# Patient Record
Sex: Female | Born: 1945
Health system: Southern US, Community
[De-identification: ages and names within clinical notes are randomized; demographics above are authoritative.]

## PROBLEM LIST (undated history)

## (undated) DIAGNOSIS — I701 Atherosclerosis of renal artery: Secondary | ICD-10-CM

## (undated) DIAGNOSIS — I6529 Occlusion and stenosis of unspecified carotid artery: Secondary | ICD-10-CM

## (undated) DIAGNOSIS — I10 Essential (primary) hypertension: Secondary | ICD-10-CM

## (undated) DIAGNOSIS — K5790 Diverticulosis of intestine, part unspecified, without perforation or abscess without bleeding: Secondary | ICD-10-CM

## (undated) DIAGNOSIS — E785 Hyperlipidemia, unspecified: Secondary | ICD-10-CM

## (undated) DIAGNOSIS — R55 Syncope and collapse: Secondary | ICD-10-CM

## (undated) DIAGNOSIS — N200 Calculus of kidney: Secondary | ICD-10-CM

## (undated) DIAGNOSIS — K635 Polyp of colon: Secondary | ICD-10-CM

## (undated) DIAGNOSIS — K219 Gastro-esophageal reflux disease without esophagitis: Secondary | ICD-10-CM

## (undated) DIAGNOSIS — M8580 Other specified disorders of bone density and structure, unspecified site: Secondary | ICD-10-CM

## (undated) DIAGNOSIS — J101 Influenza due to other identified influenza virus with other respiratory manifestations: Secondary | ICD-10-CM

## (undated) DIAGNOSIS — T7840XA Allergy, unspecified, initial encounter: Secondary | ICD-10-CM

## (undated) DIAGNOSIS — J189 Pneumonia, unspecified organism: Secondary | ICD-10-CM

## (undated) HISTORY — DX: Diverticulosis of intestine, part unspecified, without perforation or abscess without bleeding: K57.90

## (undated) HISTORY — PX: SMALL INTESTINE SURGERY: SHX150

## (undated) HISTORY — DX: Other specified disorders of bone density and structure, unspecified site: M85.80

## (undated) HISTORY — DX: Polyp of colon: K63.5

## (undated) HISTORY — DX: Occlusion and stenosis of unspecified carotid artery: I65.29

## (undated) HISTORY — DX: Gastro-esophageal reflux disease without esophagitis: K21.9

## (undated) HISTORY — DX: Pneumonia, unspecified organism: J18.9

## (undated) HISTORY — DX: Atherosclerosis of renal artery: I70.1

## (undated) HISTORY — DX: Allergy, unspecified, initial encounter: T78.40XA

## (undated) HISTORY — DX: Calculus of kidney: N20.0

## (undated) HISTORY — DX: Hyperlipidemia, unspecified: E78.5

---

## 1980-12-22 HISTORY — PX: ABDOMINAL HYSTERECTOMY: SHX81

## 2005-04-24 ENCOUNTER — Ambulatory Visit (HOSPITAL_COMMUNITY): Admission: RE | Admit: 2005-04-24 | Discharge: 2005-04-24 | Payer: Self-pay | Admitting: Family Medicine

## 2006-07-13 ENCOUNTER — Emergency Department (HOSPITAL_COMMUNITY): Admission: EM | Admit: 2006-07-13 | Discharge: 2006-07-13 | Payer: Self-pay | Admitting: Emergency Medicine

## 2006-12-22 LAB — HM COLONOSCOPY

## 2007-02-15 ENCOUNTER — Ambulatory Visit (HOSPITAL_COMMUNITY): Admission: RE | Admit: 2007-02-15 | Discharge: 2007-02-15 | Payer: Self-pay | Admitting: Internal Medicine

## 2007-02-15 ENCOUNTER — Ambulatory Visit: Payer: Self-pay | Admitting: Internal Medicine

## 2007-02-15 ENCOUNTER — Encounter (INDEPENDENT_AMBULATORY_CARE_PROVIDER_SITE_OTHER): Payer: Self-pay | Admitting: *Deleted

## 2007-04-15 ENCOUNTER — Ambulatory Visit (HOSPITAL_COMMUNITY): Admission: RE | Admit: 2007-04-15 | Discharge: 2007-04-15 | Payer: Self-pay | Admitting: Family Medicine

## 2008-03-31 ENCOUNTER — Ambulatory Visit (HOSPITAL_COMMUNITY): Admission: RE | Admit: 2008-03-31 | Discharge: 2008-03-31 | Payer: Self-pay | Admitting: Family Medicine

## 2008-05-12 ENCOUNTER — Ambulatory Visit (HOSPITAL_COMMUNITY): Admission: RE | Admit: 2008-05-12 | Discharge: 2008-05-12 | Payer: Self-pay | Admitting: Family Medicine

## 2009-06-05 ENCOUNTER — Ambulatory Visit (HOSPITAL_COMMUNITY): Admission: RE | Admit: 2009-06-05 | Discharge: 2009-06-05 | Payer: Self-pay | Admitting: Family Medicine

## 2010-09-26 ENCOUNTER — Ambulatory Visit (HOSPITAL_COMMUNITY): Admission: RE | Admit: 2010-09-26 | Discharge: 2010-09-26 | Payer: Self-pay | Admitting: Family Medicine

## 2011-05-02 ENCOUNTER — Other Ambulatory Visit: Payer: Self-pay | Admitting: Family Medicine

## 2011-05-02 DIAGNOSIS — M8580 Other specified disorders of bone density and structure, unspecified site: Secondary | ICD-10-CM

## 2011-05-03 LAB — HM DEXA SCAN

## 2011-05-07 ENCOUNTER — Ambulatory Visit (HOSPITAL_COMMUNITY)
Admission: RE | Admit: 2011-05-07 | Discharge: 2011-05-07 | Disposition: A | Discharge status: Home / Self Care | Payer: Medicare Other | Source: Ambulatory Visit | Attending: Family Medicine | Admitting: Family Medicine

## 2011-05-07 ENCOUNTER — Encounter (HOSPITAL_COMMUNITY): Payer: Self-pay

## 2011-05-07 ENCOUNTER — Other Ambulatory Visit: Payer: Self-pay

## 2011-05-07 DIAGNOSIS — M818 Other osteoporosis without current pathological fracture: Secondary | ICD-10-CM | POA: Insufficient documentation

## 2011-05-07 DIAGNOSIS — M8580 Other specified disorders of bone density and structure, unspecified site: Secondary | ICD-10-CM

## 2011-05-07 DIAGNOSIS — Z780 Asymptomatic menopausal state: Secondary | ICD-10-CM | POA: Insufficient documentation

## 2011-05-07 HISTORY — DX: Essential (primary) hypertension: I10

## 2011-05-09 NOTE — Op Note (Signed)
NAMEJAYLNN, ULLERY                  ACCOUNT NO.:  [id number]   MEDICAL RECORD NO.:  [id number]          PATIENT TYPE:  AMB   LOCATION:  DAY                           FACILITY:  [facility]   PHYSICIAN:  R. Roetta Sessions, M.D. DATE OF BIRTH:  November 10, 1946   DATE OF PROCEDURE:  02/15/2007  DATE OF DISCHARGE:                               OPERATIVE REPORT   PROCEDURE PERFORMED:  Colonoscopy and hot snare polypectomy.   INDICATIONS FOR PROCEDURE:  A 65 year old lady with intermittent paper  hematochezia, positive family history of colon cancer in two first-  degree relatives.  Her last colonoscopy is 1989 without significant  findings reportedly.  She is here for diagnostic colonoscopy.  Have  discussed with the patient at length.  Potential risks, benefits and  alternatives have been reviewed.  She is referred at the courtesy of  Nicoletta Ba.   PROCEDURE NOTE:  O2 saturation, blood pressure, pulse oximetry were  monitored throughout the entire procedure.  Conscious sedation Demerol  100 grams IV and Versed 5 mg IV in divided doses.  Pentax video chip  system.   FINDINGS:  Digital rectal exam revealed no abnormalities.  The prep was  adequate.  The colonic mucosa was surveyed from the rectosigmoid  junction through the left transverse right colon to the appendiceal  orifice, ileocecal valve and cecum where these structures were well seen  and photographed for the record.  From this level, the scope was slowly  withdrawn.  All previously mentioned mucosal surfaces were again seen.  The patient had scattered left-sided diverticulum.  The remaining  colonic mucosa appeared normal.  The scope was then pulled down to the  rectum where a thorough examination of the rectal mucosa including  retroflex view of the anal verge was undertaken.  The patient friable  anal canal hemorrhoids and an 8-mm angry appearing polyp on a stalk at  15 cm.  Please see photos.  The polyp was engaged with a snare loop  and  was resected via snare cautery.  It  was recovered.  The patient  tolerated the procedure throughout endoscopy.   IMPRESSION:  1. Friable anal canal hemorrhoids, likely source the patient's paper      hematochezia.  2. Pedunculated polyp at 15 cm (rectum) removed with hot snare.      Remainder of rectal mucosa appeared normal.  3. Left-sided diverticula.  Colonic mucosa appeared normal.   RECOMMENDATION:  1. No arthritis medications or aspirin for the next 10 days.  2. Follow-up on path.  3. Hemorrhoid and diverticulosis literature provided to Ms. Diffley,      daily fiber supplement.  4. Anusol HC suppositories one per rectum at bedtime for 10 days.  5. Further recommendations to follow.      Jonathon Bellows, M.D.  Electronically Signed     RMR/MEDQ  D:  02/15/2007  T:  02/15/2007  Job:  [id number]   cc:   Jeoffrey Massed, MD  Fax: [phone]

## 2011-10-06 ENCOUNTER — Other Ambulatory Visit: Payer: Self-pay | Admitting: Family Medicine

## 2011-10-06 DIAGNOSIS — Z139 Encounter for screening, unspecified: Secondary | ICD-10-CM

## 2011-10-07 ENCOUNTER — Ambulatory Visit (HOSPITAL_COMMUNITY)
Admission: RE | Admit: 2011-10-07 | Discharge: 2011-10-07 | Disposition: A | Discharge status: Home / Self Care | Payer: Medicare Other | Source: Ambulatory Visit | Attending: Family Medicine | Admitting: Family Medicine

## 2011-10-07 DIAGNOSIS — Z139 Encounter for screening, unspecified: Secondary | ICD-10-CM

## 2011-10-07 DIAGNOSIS — Z1231 Encounter for screening mammogram for malignant neoplasm of breast: Secondary | ICD-10-CM | POA: Insufficient documentation

## 2012-05-07 DIAGNOSIS — Z0000 Encounter for general adult medical examination without abnormal findings: Secondary | ICD-10-CM | POA: Diagnosis not present

## 2012-05-18 ENCOUNTER — Other Ambulatory Visit: Payer: Self-pay | Admitting: Family Medicine

## 2012-05-18 DIAGNOSIS — R0989 Other specified symptoms and signs involving the circulatory and respiratory systems: Secondary | ICD-10-CM

## 2012-05-20 ENCOUNTER — Ambulatory Visit (HOSPITAL_COMMUNITY)
Admission: RE | Admit: 2012-05-20 | Discharge: 2012-05-20 | Disposition: A | Discharge status: Home / Self Care | Payer: Medicare Other | Source: Ambulatory Visit | Attending: Family Medicine | Admitting: Family Medicine

## 2012-05-20 DIAGNOSIS — R0989 Other specified symptoms and signs involving the circulatory and respiratory systems: Secondary | ICD-10-CM

## 2012-05-20 DIAGNOSIS — I658 Occlusion and stenosis of other precerebral arteries: Secondary | ICD-10-CM | POA: Diagnosis not present

## 2012-05-26 DIAGNOSIS — M810 Age-related osteoporosis without current pathological fracture: Secondary | ICD-10-CM | POA: Diagnosis not present

## 2012-06-07 ENCOUNTER — Encounter: Payer: Self-pay | Admitting: Vascular Surgery

## 2012-06-21 ENCOUNTER — Encounter: Payer: Self-pay | Admitting: Vascular Surgery

## 2012-06-22 ENCOUNTER — Ambulatory Visit (INDEPENDENT_AMBULATORY_CARE_PROVIDER_SITE_OTHER): Payer: Medicare Other | Admitting: Vascular Surgery

## 2012-06-22 ENCOUNTER — Encounter: Payer: Self-pay | Admitting: Vascular Surgery

## 2012-06-22 VITALS — BP 165/55 | HR 76 | Temp 97.8°F | Ht 63.0 in | Wt 149.0 lb

## 2012-06-22 DIAGNOSIS — I6529 Occlusion and stenosis of unspecified carotid artery: Secondary | ICD-10-CM | POA: Diagnosis not present

## 2012-06-22 NOTE — Progress Notes (Signed)
[facility]   Vascular Consult Note    Patient name: Stacey Moody MRN: [id number] DOB: 07/20/1946 Sex: female   Referred by: Tanya Nones  Reason for referral: Carotid stenosis  HISTORY OF PRESENT ILLNESS:  The patient presents today for evaluation of asymptomatic cerebrovascular occlusive disease. She had been found to have a carotid bruit in the past and in 2009 underwent duplex showing moderate 50-69% stenosis bilaterally at an outlying center. He recently had repeat study in May of 2013 this shows continued 50-69% stenosis with extensive calcification bilaterally. She denies any prior neurologic deficits. She specifically denies any amaurosis fugax transient ischemic attack or stroke.  Past Medical History  Diagnosis Date  . Hypertension   . Carotid artery occlusion   . Osteopenia   . Hyperlipidemia   . GERD (gastroesophageal reflux disease)     Past Surgical History  Procedure Date  . Abdominal hysterectomy 1982    Partial hysterectomy  . Small intestine surgery     History   Social History  . Marital Status: Widowed    Spouse Name: N/A    Number of Children: N/A  . Years of Education: N/A   Occupational History  . Not on file.   Social History Main Topics  . Smoking status: Former Smoker    Quit date: 12/22/1996  . Smokeless tobacco: Never Used  . Alcohol Use: No  . Drug Use: No  . Sexually Active: Not on file   Other Topics Concern  . Not on file   Social History Narrative  . No narrative on file    Family History  Problem Relation Age of Onset  . Cancer Mother     pancratic  . Heart attack Mother   . Cancer Father     stomach  . Diabetes Father   . Hyperlipidemia Father   . Cancer Sister     lung  . Cancer Brother     thyroid, prostate  . Cancer Sister     colon  . Cancer Sister     colon    Allergies  Allergen Reactions  . Tetanus Toxoids     Prior to Admission medications   Medication Sig  Start Date End Date Taking? Authorizing Provider  amLODipine (NORVASC) 10 MG tablet Take 10 mg by mouth daily.   Yes Historical Provider, MD  aspirin 325 MG tablet Take 325 mg by mouth daily.   Yes Historical Provider, MD  cholecalciferol (VITAMIN D) 1000 UNITS tablet Take 1,000 Units by mouth daily.   Yes Historical Provider, MD  Denosumab (PROLIA [place]) Inject into the skin. Twice a year last received on 06/05/2012   Yes Historical Provider, MD  hydrochlorothiazide (HYDRODIURIL) 25 MG tablet Take 25 mg by mouth daily.   Yes Historical Provider, MD  rosuvastatin (CRESTOR) 10 MG tablet Take 20 mg by mouth daily.    Yes Historical Provider, MD     REVIEW OF SYSTEMS: Cardiovascular: No chest pain, chest pressure, palpitations, orthopnea, or dyspnea on exertion. No claudication or rest pain,  No history of DVT or phlebitis. Pulmonary: No productive cough, asthma or wheezing. Neurologic: No weakness, paresthesias, aphasia, or amaurosis. No dizziness. Hematologic: No bleeding problems or clotting disorders. Musculoskeletal: No joint pain or joint swelling. Gastrointestinal: No blood in stool or hematemesis Genitourinary: No dysuria or hematuria. Psychiatric:: No history of major depression. Integumentary: No rashes or ulcers. Constitutional: No fever or chills.  PHYSICAL EXAMINATION:  Filed Vitals:  06/22/12 0843  BP: 165/55  Pulse: 76  Temp:     General: The patient appears their stated age. Pulmonary: There is a good air exchange bilaterally without wheezing or rales. Abdomen: Soft and non-tender with normal pitch bowel sounds. Musculoskeletal: There are no major deformities.  There is no significant extremity pain. Neurologic: No focal weakness or paresthesias are detected, Skin: There are no ulcer or rashes noted. Psychiatric: The patient has normal affect. Cardiovascular: There is a regular rate and rhythm without significant murmur appreciated. Carotid artery: 2+ pulses. I do  not appreciate a bruit today. Peripheral pulses: 2+ radial 2+ femoral and 2+ posterior tibial pulses bilaterally   Outside Studies/Documentation Historical records were reviewed.  They showed carotid duplex in 2009 in May of 2013 from outlying facility were reviewed showing 50-69% stenoses bilaterally  Medication Changes: None  Assessment:  Moderate to severe bilateral carotid stenosis   Plan: I had a long discussion with the patient and her daughter present. I explained symptoms of carotid disease with her. Ex-preemie she is below the threshold where recommend any treatment for her level of asymptomatic stenosis. I explained we would recommend yearly duplex of her carotids to rule out asymptomatic progression. Plan to see her again in one year and she'll notify should she develop any difficulty in the future  Ladarrell Cornwall 7/2/20133:19 PM  .

## 2012-06-29 ENCOUNTER — Encounter: Payer: Medicare Other | Admitting: Vascular Surgery

## 2012-10-11 ENCOUNTER — Other Ambulatory Visit: Payer: Self-pay | Admitting: Family Medicine

## 2012-10-11 DIAGNOSIS — Z139 Encounter for screening, unspecified: Secondary | ICD-10-CM

## 2012-10-15 ENCOUNTER — Ambulatory Visit (HOSPITAL_COMMUNITY)
Admission: RE | Admit: 2012-10-15 | Discharge: 2012-10-15 | Disposition: A | Discharge status: Home / Self Care | Payer: Medicare Other | Source: Ambulatory Visit | Attending: Family Medicine | Admitting: Family Medicine

## 2012-10-15 DIAGNOSIS — Z1231 Encounter for screening mammogram for malignant neoplasm of breast: Secondary | ICD-10-CM | POA: Insufficient documentation

## 2012-10-15 DIAGNOSIS — Z139 Encounter for screening, unspecified: Secondary | ICD-10-CM

## 2012-11-16 DIAGNOSIS — M76899 Other specified enthesopathies of unspecified lower limb, excluding foot: Secondary | ICD-10-CM | POA: Diagnosis not present

## 2012-11-16 DIAGNOSIS — M5430 Sciatica, unspecified side: Secondary | ICD-10-CM | POA: Diagnosis not present

## 2013-04-25 ENCOUNTER — Telehealth: Payer: Self-pay | Admitting: Family Medicine

## 2013-04-25 MED ORDER — HYDROCHLOROTHIAZIDE 25 MG PO TABS: 25.0000 mg | ORAL_TABLET | Freq: Every day | ORAL | Status: DC

## 2013-04-25 MED ORDER — ROSUVASTATIN CALCIUM 10 MG PO TABS: 20.0000 mg | ORAL_TABLET | Freq: Every day | ORAL | Status: DC

## 2013-04-25 NOTE — Telephone Encounter (Signed)
Rx Refilled  

## 2013-05-09 ENCOUNTER — Other Ambulatory Visit: Payer: Self-pay | Admitting: Family Medicine

## 2013-05-09 ENCOUNTER — Ambulatory Visit (INDEPENDENT_AMBULATORY_CARE_PROVIDER_SITE_OTHER): Payer: Medicare Other | Admitting: Family Medicine

## 2013-05-09 ENCOUNTER — Encounter: Payer: Self-pay | Admitting: Family Medicine

## 2013-05-09 VITALS — BP 130/60 | HR 76 | Temp 98.1°F | Resp 16 | Ht 64.0 in | Wt 152.0 lb

## 2013-05-09 DIAGNOSIS — Z0000 Encounter for general adult medical examination without abnormal findings: Secondary | ICD-10-CM | POA: Diagnosis not present

## 2013-05-09 DIAGNOSIS — E785 Hyperlipidemia, unspecified: Secondary | ICD-10-CM | POA: Diagnosis not present

## 2013-05-09 DIAGNOSIS — I10 Essential (primary) hypertension: Secondary | ICD-10-CM | POA: Diagnosis not present

## 2013-05-09 DIAGNOSIS — M810 Age-related osteoporosis without current pathological fracture: Secondary | ICD-10-CM

## 2013-05-09 DIAGNOSIS — I6529 Occlusion and stenosis of unspecified carotid artery: Secondary | ICD-10-CM

## 2013-05-09 DIAGNOSIS — R7309 Other abnormal glucose: Secondary | ICD-10-CM | POA: Diagnosis not present

## 2013-05-09 DIAGNOSIS — K219 Gastro-esophageal reflux disease without esophagitis: Secondary | ICD-10-CM | POA: Insufficient documentation

## 2013-05-09 LAB — CBC WITH DIFFERENTIAL/PLATELET
Basophils Relative: 1 % (ref 0–1)
Eosinophils Absolute: 0 10*3/uL (ref 0.0–0.7)
Eosinophils Relative: 1 % (ref 0–5)
Lymphs Abs: 1.2 10*3/uL (ref 0.7–4.0)
MCH: 32.2 pg (ref 26.0–34.0)
MCHC: 35.5 g/dL (ref 30.0–36.0)
MCV: 90.6 fL (ref 78.0–100.0)
Monocytes Relative: 12 % (ref 3–12)
Platelets: 253 10*3/uL (ref 150–400)
RBC: 4.16 MIL/uL (ref 3.87–5.11)

## 2013-05-09 LAB — BASIC METABOLIC PANEL
BUN: 13 mg/dL (ref 6–23)
CO2: 27 mEq/L (ref 19–32)
Chloride: 106 mEq/L (ref 96–112)
Potassium: 3.9 mEq/L (ref 3.5–5.3)

## 2013-05-09 LAB — HEPATIC FUNCTION PANEL
AST: 27 U/L (ref 0–37)
Albumin: 4.6 g/dL (ref 3.5–5.2)
Alkaline Phosphatase: 60 U/L (ref 39–117)
Total Protein: 6.9 g/dL (ref 6.0–8.3)

## 2013-05-09 LAB — TSH: TSH: 3.158 u[IU]/mL (ref 0.350–4.500)

## 2013-05-09 MED ORDER — LOSARTAN POTASSIUM 50 MG PO TABS: 50.0000 mg | ORAL_TABLET | Freq: Every day | ORAL | Status: DC

## 2013-05-09 MED ORDER — DOXYCYCLINE HYCLATE 100 MG PO TABS: 100.0000 mg | ORAL_TABLET | Freq: Two times a day (BID) | ORAL | Status: DC

## 2013-05-09 NOTE — Progress Notes (Signed)
Subjective:    Patient ID: Stacey Moody, female    DOB: March 04, 1946, 67 y.o.   MRN: [id number]  HPI  Patient is here today for physical exam. Just has a red rash on the medial aspect of her left ankle. Is a red ring approximately 5 cm in diameter. There is central clearing. There are petechiae within the area of erythema. There is poor. She denies any trauma. It has been there for 3 days. She denies any fevers chills aches or pains. She does have 2 dogs and works outside a lot. She denies any recent tick bite. She also complains of swelling in her ankles. This is due to the amlodipine. She is interested in other options for blood pressure control. Past Medical History  Diagnosis Date  . Carotid artery occlusion   . Osteopenia   . Hypertension   . Hyperlipidemia   . GERD (gastroesophageal reflux disease)    Current Outpatient Prescriptions on File Prior to Visit  Medication Sig Dispense Refill  . aspirin 325 MG tablet Take 325 mg by mouth daily.      . cholecalciferol (VITAMIN D) 1000 UNITS tablet Take 1,000 Units by mouth daily.      . Denosumab (PROLIA [place]) Inject into the skin. Twice a year last received on 06/05/2012      . hydrochlorothiazide (HYDRODIURIL) 25 MG tablet Take 1 tablet (25 mg total) by mouth daily.  30 tablet  5  . rosuvastatin (CRESTOR) 10 MG tablet Take 2 tablets (20 mg total) by mouth daily.  30 tablet  5   No current facility-administered medications on file prior to visit.   Allergies  Allergen Reactions  . Tetanus Toxoids    History   Social History  . Marital Status: Widowed    Spouse Name: N/A    Number of Children: N/A  . Years of Education: N/A   Occupational History  . Not on file.   Social History Main Topics  . Smoking status: Former Smoker    Quit date: 12/22/1996  . Smokeless tobacco: Never Used  . Alcohol Use: No  . Drug Use: No  . Sexually Active: Not on file   Other Topics Concern  . Not on file   Social History Narrative  . No  narrative on file   Stacey Moody had no medications administered during this visit.   Review of Systems  All other systems reviewed and are negative.   colonoscopy was in 2007. Her mammogram is not due until the fall of 20 pain. She did not get Pap smears due to her history of a hysterectomy. Her Pneumovax, tetanus shot, and Zostavax are up-to-date. She does have a history of osteopenia on a DEXA scan. She cannot tolerate bisphosphonates. She cannot tolerate prolia.     Objective:   Physical Exam  Vitals reviewed. Constitutional: She is oriented to person, place, and time. She appears well-developed and well-nourished.  HENT:  Head: Normocephalic and atraumatic.  Right Ear: External ear normal.  Left Ear: External ear normal.  Nose: Nose normal.  Mouth/Throat: Oropharynx is clear and moist. No oropharyngeal exudate.  Eyes: Conjunctivae and EOM are normal. Pupils are equal, round, and reactive to light. Right eye exhibits no discharge. Left eye exhibits no discharge. No scleral icterus.  Neck: Normal range of motion. Neck supple. No JVD present. No tracheal deviation present. No thyromegaly present.  Cardiovascular: Normal rate, regular rhythm and normal heart sounds.  Exam reveals no gallop and no friction rub.  No murmur heard. Pulmonary/Chest: Effort normal and breath sounds normal. No respiratory distress. She has no wheezes. She has no rales. She exhibits no tenderness.  Abdominal: Soft. Bowel sounds are normal. She exhibits no distension. There is no tenderness. There is no rebound and no guarding.  Musculoskeletal: Normal range of motion. She exhibits no edema and no tenderness.  Lymphadenopathy:    She has no cervical adenopathy.  Neurological: She is alert and oriented to person, place, and time. She has normal reflexes. She displays normal reflexes. No cranial nerve deficit. She exhibits normal muscle tone. Coordination normal.  Skin: Skin is warm. Rash noted. There is erythema.  No pallor.  Psychiatric: She has a normal mood and affect. Her behavior is normal. Judgment and thought content normal.   5 cm red ring on her medial left ankle Breast exam is normal       Assessment & Plan:  1. Routine general medical examination at a health care facility Cancer screening and immunizations are up to date - Basic Metabolic Panel - CBC with Differential - Hepatic Function Panel - Lipid Panel - TSH - Vitamin D, 25-hydroxy  2. HTN (hypertension) Blood pressure is well controlled. Discontinue amlodipine due to swelling. Replace with losartan 50 mg by mouth - Basic Metabolic Panel - Hepatic Function Panel - Lipid Panel  3. Osteoporosis, unspecified We discussed Evista versus re\re class. The patient elects to continue calcium and vitamin D and recheck a DEXA scan in 2 years. She is afraid of this increased stroke risk on Evista - TSH - Vitamin D, 25-hydroxy  4. Hypertension  5. Hyperlipidemia Check fasting lipid panel. Goal LDL is less than 70  6. Occlusion and stenosis of carotid artery without mention of cerebral infarction, unspecified laterality Continue aspirin and statin therapy. Goal LDL is less than 70. Patient is followed by Dr. Arbie Cookey.  #7 possible Lyme disease. Start doxycycline 100 mg by mouth twice a day for 21 days.  Recheck in 2 weeks sooner if worse.

## 2013-05-10 LAB — HEMOGLOBIN A1C
Hgb A1c MFr Bld: 5.8 % — ABNORMAL HIGH (ref <5.7)
Mean Plasma Glucose: 120 mg/dL — ABNORMAL HIGH (ref <117)

## 2013-05-10 LAB — VITAMIN D 25 HYDROXY (VIT D DEFICIENCY, FRACTURES): Vit D, 25-Hydroxy: 45 ng/mL (ref 30–89)

## 2013-06-22 ENCOUNTER — Other Ambulatory Visit: Payer: Self-pay | Admitting: *Deleted

## 2013-06-28 ENCOUNTER — Other Ambulatory Visit (INDEPENDENT_AMBULATORY_CARE_PROVIDER_SITE_OTHER): Payer: Medicare Other | Admitting: *Deleted

## 2013-06-28 ENCOUNTER — Ambulatory Visit: Payer: Medicare Other | Admitting: Neurosurgery

## 2013-06-28 DIAGNOSIS — I6529 Occlusion and stenosis of unspecified carotid artery: Secondary | ICD-10-CM

## 2013-06-30 ENCOUNTER — Other Ambulatory Visit: Payer: Self-pay | Admitting: *Deleted

## 2013-07-05 ENCOUNTER — Encounter: Payer: Self-pay | Admitting: Vascular Surgery

## 2013-10-27 ENCOUNTER — Other Ambulatory Visit: Payer: Self-pay | Admitting: Family Medicine

## 2013-10-27 NOTE — Telephone Encounter (Signed)
Refilled for 30 days only, pt is due for office visit

## 2013-12-05 ENCOUNTER — Other Ambulatory Visit: Payer: Medicare Other

## 2013-12-05 DIAGNOSIS — E785 Hyperlipidemia, unspecified: Secondary | ICD-10-CM

## 2013-12-05 DIAGNOSIS — M810 Age-related osteoporosis without current pathological fracture: Secondary | ICD-10-CM

## 2013-12-05 DIAGNOSIS — R7309 Other abnormal glucose: Secondary | ICD-10-CM

## 2013-12-05 DIAGNOSIS — Z79899 Other long term (current) drug therapy: Secondary | ICD-10-CM | POA: Diagnosis not present

## 2013-12-05 DIAGNOSIS — I10 Essential (primary) hypertension: Secondary | ICD-10-CM | POA: Diagnosis not present

## 2013-12-05 LAB — CBC WITH DIFFERENTIAL/PLATELET
Basophils Relative: 1 % (ref 0–1)
Eosinophils Absolute: 0.1 10*3/uL (ref 0.0–0.7)
Eosinophils Relative: 2 % (ref 0–5)
MCH: 32.9 pg (ref 26.0–34.0)
MCHC: 34.6 g/dL (ref 30.0–36.0)
MCV: 95 fL (ref 78.0–100.0)
Neutrophils Relative %: 56 % (ref 43–77)
Platelets: 253 10*3/uL (ref 150–400)

## 2013-12-05 LAB — COMPLETE METABOLIC PANEL WITH GFR
ALT: 14 U/L (ref 0–35)
AST: 18 U/L (ref 0–37)
Albumin: 4.6 g/dL (ref 3.5–5.2)
CO2: 28 mEq/L (ref 19–32)
Calcium: 9.7 mg/dL (ref 8.4–10.5)
Chloride: 103 mEq/L (ref 96–112)
GFR, Est African American: >89 mL/min
Potassium: 4.5 mEq/L (ref 3.5–5.3)
Sodium: 141 mEq/L (ref 135–145)
Total Protein: 6.6 g/dL (ref 6.0–8.3)

## 2013-12-05 LAB — LIPID PANEL
HDL: 63 mg/dL (ref >39)
LDL Cholesterol: 69 mg/dL (ref 0–99)
Total CHOL/HDL Ratio: 2.3 Ratio

## 2013-12-05 LAB — TSH: TSH: 2.617 u[IU]/mL (ref 0.350–4.500)

## 2013-12-05 LAB — HEMOGLOBIN A1C: Mean Plasma Glucose: 126 mg/dL — ABNORMAL HIGH (ref <117)

## 2013-12-06 LAB — VITAMIN D 25 HYDROXY (VIT D DEFICIENCY, FRACTURES): Vit D, 25-Hydroxy: 55 ng/mL (ref 30–89)

## 2013-12-31 ENCOUNTER — Emergency Department (HOSPITAL_COMMUNITY): Payer: Medicare Other

## 2013-12-31 ENCOUNTER — Inpatient Hospital Stay (HOSPITAL_COMMUNITY)
Admission: EM | Admit: 2013-12-31 | Discharge: 2014-01-03 | DRG: 641 | Disposition: A | Discharge status: Home / Self Care | Payer: Medicare Other | Attending: Internal Medicine | Admitting: Internal Medicine

## 2013-12-31 ENCOUNTER — Encounter (HOSPITAL_COMMUNITY): Payer: Self-pay | Admitting: Emergency Medicine

## 2013-12-31 DIAGNOSIS — H6691 Otitis media, unspecified, right ear: Secondary | ICD-10-CM | POA: Diagnosis present

## 2013-12-31 DIAGNOSIS — D72819 Decreased white blood cell count, unspecified: Secondary | ICD-10-CM | POA: Diagnosis not present

## 2013-12-31 DIAGNOSIS — D61818 Other pancytopenia: Secondary | ICD-10-CM | POA: Diagnosis not present

## 2013-12-31 DIAGNOSIS — E861 Hypovolemia: Principal | ICD-10-CM | POA: Diagnosis present

## 2013-12-31 DIAGNOSIS — I6529 Occlusion and stenosis of unspecified carotid artery: Secondary | ICD-10-CM | POA: Diagnosis present

## 2013-12-31 DIAGNOSIS — W108XXA Fall (on) (from) other stairs and steps, initial encounter: Secondary | ICD-10-CM | POA: Diagnosis present

## 2013-12-31 DIAGNOSIS — K219 Gastro-esophageal reflux disease without esophagitis: Secondary | ICD-10-CM | POA: Diagnosis not present

## 2013-12-31 DIAGNOSIS — S59909A Unspecified injury of unspecified elbow, initial encounter: Secondary | ICD-10-CM | POA: Diagnosis not present

## 2013-12-31 DIAGNOSIS — J101 Influenza due to other identified influenza virus with other respiratory manifestations: Secondary | ICD-10-CM | POA: Diagnosis present

## 2013-12-31 DIAGNOSIS — R42 Dizziness and giddiness: Secondary | ICD-10-CM | POA: Diagnosis present

## 2013-12-31 DIAGNOSIS — E785 Hyperlipidemia, unspecified: Secondary | ICD-10-CM | POA: Diagnosis not present

## 2013-12-31 DIAGNOSIS — H6690 Otitis media, unspecified, unspecified ear: Secondary | ICD-10-CM | POA: Diagnosis not present

## 2013-12-31 DIAGNOSIS — E860 Dehydration: Secondary | ICD-10-CM | POA: Diagnosis present

## 2013-12-31 DIAGNOSIS — S0181XA Laceration without foreign body of other part of head, initial encounter: Secondary | ICD-10-CM

## 2013-12-31 DIAGNOSIS — J111 Influenza due to unidentified influenza virus with other respiratory manifestations: Secondary | ICD-10-CM | POA: Diagnosis present

## 2013-12-31 DIAGNOSIS — S060X9A Concussion with loss of consciousness of unspecified duration, initial encounter: Secondary | ICD-10-CM | POA: Diagnosis not present

## 2013-12-31 DIAGNOSIS — M899 Disorder of bone, unspecified: Secondary | ICD-10-CM | POA: Diagnosis present

## 2013-12-31 DIAGNOSIS — R55 Syncope and collapse: Secondary | ICD-10-CM | POA: Diagnosis present

## 2013-12-31 DIAGNOSIS — M949 Disorder of cartilage, unspecified: Secondary | ICD-10-CM

## 2013-12-31 DIAGNOSIS — S1093XA Contusion of unspecified part of neck, initial encounter: Secondary | ICD-10-CM | POA: Diagnosis present

## 2013-12-31 DIAGNOSIS — R197 Diarrhea, unspecified: Secondary | ICD-10-CM | POA: Diagnosis not present

## 2013-12-31 DIAGNOSIS — S0083XA Contusion of other part of head, initial encounter: Secondary | ICD-10-CM | POA: Diagnosis present

## 2013-12-31 DIAGNOSIS — I10 Essential (primary) hypertension: Secondary | ICD-10-CM | POA: Diagnosis present

## 2013-12-31 DIAGNOSIS — S0003XA Contusion of scalp, initial encounter: Secondary | ICD-10-CM | POA: Diagnosis not present

## 2013-12-31 DIAGNOSIS — S069X9A Unspecified intracranial injury with loss of consciousness of unspecified duration, initial encounter: Secondary | ICD-10-CM

## 2013-12-31 DIAGNOSIS — Z833 Family history of diabetes mellitus: Secondary | ICD-10-CM | POA: Diagnosis not present

## 2013-12-31 DIAGNOSIS — Z87891 Personal history of nicotine dependence: Secondary | ICD-10-CM

## 2013-12-31 DIAGNOSIS — I517 Cardiomegaly: Secondary | ICD-10-CM | POA: Diagnosis not present

## 2013-12-31 DIAGNOSIS — S0180XA Unspecified open wound of other part of head, initial encounter: Secondary | ICD-10-CM | POA: Diagnosis present

## 2013-12-31 DIAGNOSIS — I658 Occlusion and stenosis of other precerebral arteries: Secondary | ICD-10-CM | POA: Diagnosis not present

## 2013-12-31 DIAGNOSIS — R079 Chest pain, unspecified: Secondary | ICD-10-CM | POA: Diagnosis not present

## 2013-12-31 DIAGNOSIS — Z8249 Family history of ischemic heart disease and other diseases of the circulatory system: Secondary | ICD-10-CM | POA: Diagnosis not present

## 2013-12-31 DIAGNOSIS — S41009A Unspecified open wound of unspecified shoulder, initial encounter: Secondary | ICD-10-CM | POA: Diagnosis not present

## 2013-12-31 DIAGNOSIS — J069 Acute upper respiratory infection, unspecified: Secondary | ICD-10-CM | POA: Diagnosis present

## 2013-12-31 DIAGNOSIS — Y92009 Unspecified place in unspecified non-institutional (private) residence as the place of occurrence of the external cause: Secondary | ICD-10-CM

## 2013-12-31 DIAGNOSIS — S298XXA Other specified injuries of thorax, initial encounter: Secondary | ICD-10-CM | POA: Diagnosis not present

## 2013-12-31 HISTORY — DX: Syncope and collapse: R55

## 2013-12-31 HISTORY — DX: Influenza due to other identified influenza virus with other respiratory manifestations: J10.1

## 2013-12-31 LAB — CBC WITH DIFFERENTIAL/PLATELET
BASOS ABS: 0 10*3/uL (ref 0.0–0.1)
BASOS PCT: 0 % (ref 0–1)
Eosinophils Absolute: 0 10*3/uL (ref 0.0–0.7)
Eosinophils Relative: 0 % (ref 0–5)
HCT: 41.1 % (ref 36.0–46.0)
Hemoglobin: 14 g/dL (ref 12.0–15.0)
LYMPHS PCT: 8 % — AB (ref 12–46)
Lymphs Abs: 0.5 10*3/uL — ABNORMAL LOW (ref 0.7–4.0)
MCH: 32.2 pg (ref 26.0–34.0)
MCHC: 34.1 g/dL (ref 30.0–36.0)
MCV: 94.5 fL (ref 78.0–100.0)
Monocytes Absolute: 0.5 10*3/uL (ref 0.1–1.0)
Monocytes Relative: 7 % (ref 3–12)
NEUTROS ABS: 5.3 10*3/uL (ref 1.7–7.7)
NEUTROS PCT: 85 % — AB (ref 43–77)
Platelets: 192 10*3/uL (ref 150–400)
RBC: 4.35 MIL/uL (ref 3.87–5.11)
RDW: 12.7 % (ref 11.5–15.5)
WBC: 6.3 10*3/uL (ref 4.0–10.5)

## 2013-12-31 LAB — COMPREHENSIVE METABOLIC PANEL
ALBUMIN: 4.1 g/dL (ref 3.5–5.2)
ALT: 41 U/L — AB (ref 0–35)
AST: 62 U/L — AB (ref 0–37)
Alkaline Phosphatase: 77 U/L (ref 39–117)
BILIRUBIN TOTAL: 0.3 mg/dL (ref 0.3–1.2)
BUN: 20 mg/dL (ref 6–23)
CHLORIDE: 95 meq/L — AB (ref 96–112)
CO2: 22 meq/L (ref 19–32)
Calcium: 9.2 mg/dL (ref 8.4–10.5)
Creatinine, Ser: 0.78 mg/dL (ref 0.50–1.10)
GFR calc Af Amer: >90 mL/min (ref >90)
GFR, EST NON AFRICAN AMERICAN: 84 mL/min — AB (ref >90)
Glucose, Bld: 116 mg/dL — ABNORMAL HIGH (ref 70–99)
POTASSIUM: 3.7 meq/L (ref 3.7–5.3)
SODIUM: 134 meq/L — AB (ref 137–147)
Total Protein: 7.5 g/dL (ref 6.0–8.3)

## 2013-12-31 LAB — CG4 I-STAT (LACTIC ACID): Lactic Acid, Venous: 1.19 mmol/L (ref 0.5–2.2)

## 2013-12-31 MED ORDER — SODIUM CHLORIDE 0.9 % IV BOLUS (SEPSIS)
1000.0000 mL | Freq: Once | INTRAVENOUS | Status: AC
  Administered 2013-12-31: 1000 mL via INTRAVENOUS

## 2013-12-31 MED ORDER — ONDANSETRON HCL 4 MG/2ML IJ SOLN
4.0000 mg | Freq: Once | INTRAMUSCULAR | Status: AC
  Administered 2013-12-31: 4 mg via INTRAVENOUS
  Filled 2013-12-31: qty 2

## 2013-12-31 MED ORDER — BACITRACIN ZINC 500 UNIT/GM EX OINT
TOPICAL_OINTMENT | CUTANEOUS | Status: AC
  Administered 2013-12-31: 1
  Filled 2013-12-31: qty 0.9

## 2013-12-31 NOTE — ED Notes (Signed)
Pt from home via ems, reportedly has been dizzy and fell down approx 5 steps tonight.  Pt has laceration to chin, skin tear to right wrist.

## 2013-12-31 NOTE — ED Notes (Signed)
Bandage replaced to right forearm, due to bleeding.

## 2013-12-31 NOTE — ED Provider Notes (Signed)
CSN: [id number]     Arrival date & time 12/31/13  1949 History   First MD Initiated Contact with Patient 12/31/13 1959     Chief Complaint  Patient presents with  . Loss of Consciousness  . Fall  . Facial Laceration   (Consider location/radiation/quality/duration/timing/severity/associated sxs/prior Treatment) HPI This 68 year old female is been sick for about a week with a cough nasal congestion and some body aches took Zithromax without improvement and now has 2-3 days of multiple episodes of nonbloody vomiting and diarrhea this evening went to the bathroom and is the last thing she remembered other than waking up on the floor at the bottom of the stairs it is unknown whether she tripped and fell versus had a syncopal episode and fell she has been feeling lightheaded and generally weak when she stands up without vertigo, the episode occurred just prior to arrival because the patient does not think she had a long spell of syncope, she is no headache no neck pain no back pain no shortness of breath no abdominal pain no bloody vomiting no bloody diarrhea she denies any change in speech vision swallowing or understanding denies focal or lateralizing weakness numbness or incoordination she has multiple abrasions with superficial skin tears to both hands and wrists without underlying bony pain she has an abrasion on her right shoulder and she has a chin laceration but no loose teeth no facial bony pain no jaw malocclusion no treatment prior to arrival her last tetanus shot was 2 years ago when she had an adverse reaction to it she does not want pain medicine but does have some nausea and will allow a dose of Zofran. She only has chest pain when she coughs. She has no rash.  Past Medical History  Diagnosis Date  . Carotid artery occlusion   . Osteopenia   . Hypertension   . Hyperlipidemia   . GERD (gastroesophageal reflux disease)    Past Surgical History  Procedure Laterality Date  . Abdominal  hysterectomy  1982    Partial hysterectomy  . Small intestine surgery     Family History  Problem Relation Age of Onset  . Cancer Mother     pancratic  . Heart attack Mother   . Cancer Father     stomach  . Diabetes Father   . Hyperlipidemia Father   . Cancer Sister     lung  . Cancer Brother     thyroid, prostate  . Cancer Sister     colon  . Cancer Sister     colon   History  Substance Use Topics  . Smoking status: Former Smoker    Quit date: 12/22/1996  . Smokeless tobacco: Never Used  . Alcohol Use: No   OB History   Grav Para Term Preterm Abortions TAB SAB Ect Mult Living                 Review of Systems 10 Systems reviewed and are negative for acute change except as noted in the HPI. Allergies  Tetanus toxoids  Home Medications   No current outpatient prescriptions on file. BP 140/45  Pulse 73  Temp(Src) 97.8 F (36.6 C) (Oral)  Resp 13  Ht 5\' 3"  (1.6 m)  Wt 146 lb (66.225 kg)  BMI 25.87 kg/m2  SpO2 97% Physical Exam  Nursing note and vitals reviewed. Constitutional:  Awake, alert, nontoxic appearance with baseline speech for patient.  HENT:  Mouth/Throat: No oropharyngeal exudate.  3 cm irregular chin laceration  no foreign body noted no deep structure involvement noted normal jaw occlusion dentition intact no facial bony tenderness normal jaw opening  Eyes: EOM are normal. Pupils are equal, round, and reactive to light. Right eye exhibits no discharge. Left eye exhibits no discharge.  No nystagmus negative test of skew  Neck: Neck supple.  Cervical spine nontender  Cardiovascular: Normal rate and regular rhythm.   No murmur heard. Pulmonary/Chest: Effort normal and breath sounds normal. No stridor. No respiratory distress. She has no wheezes. She has no rales. She exhibits no tenderness.  Abdominal: Soft. Bowel sounds are normal. She exhibits no mass. There is no tenderness. There is no rebound.  Musculoskeletal: She exhibits tenderness.   Baseline ROM, moves extremities with no obvious new focal weakness. Back nontender arms and legs nontender except for minimal tenderness to right shoulder abrasion with good right shoulder range of motion with negative right shoulder drop test otherwise no tenderness to arms and legs are nontender  Lymphadenopathy:    She has no cervical adenopathy.  Neurological: She is alert.  Awake, alert, cooperative and aware of situation; motor strength 5/5 bilaterally; sensation normal to light touch bilaterally; peripheral visual fields full to confrontation; no facial asymmetry; tongue midline; major cranial nerves appear intact; no pronator drift, normal finger to nose bilaterally; patient reports had baseline gait without new ataxia after she woke up and family states patient was walking afterwards as well  Skin: No rash noted.  Psychiatric: She has a normal mood and affect.    ED Course  Procedures (including critical care time) LACERATION REPAIR Performed by: Babette Relic Consent: Verbal consent obtained. Risks and benefits: risks, benefits and alternatives were discussed Patient identity confirmed: provided demographic data Time out performed prior to procedure Prepped and Draped in normal sterile fashion Wound explored no FB or deep structure involvement noted Laceration Location: chin Laceration Length: 3cm No Foreign Bodies seen or palpated Amount of cleaning: standard water and Safclens Skin closure: Dermabond Number of sutures or staples: N/A Technique: tissue glue adequate margin re-approximation Patient tolerance: Patient tolerated the procedure well with no immediate complications.  Patient / Family / Caregiver informed of clinical course, understand medical decision-making process, and agree with plan.d/w [facility] for Obs.  Labs Review Labs Reviewed  CBC WITH DIFFERENTIAL - Abnormal; Notable for the following:    Neutrophils Relative % 85 (*)    Lymphocytes Relative 8 (*)     Lymphs Abs 0.5 (*)    All other components within normal limits  COMPREHENSIVE METABOLIC PANEL - Abnormal; Notable for the following:    Sodium 134 (*)    Chloride 95 (*)    Glucose, Bld 116 (*)    AST 62 (*)    ALT 41 (*)    GFR calc non Af Amer 84 (*)    All other components within normal limits  INFLUENZA PANEL BY PCR (TYPE A & B, H1N1) - Abnormal; Notable for the following:    Influenza A By PCR POSITIVE (*)    All other components within normal limits  CLOSTRIDIUM DIFFICILE BY PCR  URINALYSIS, ROUTINE W REFLEX MICROSCOPIC  CG4 I-STAT (LACTIC ACID)   Imaging Review Dg Chest 2 View  12/31/2013   CLINICAL DATA:  Recent traumatic injury with pain  EXAM: CHEST  2 VIEW  COMPARISON:  07/13/2006  FINDINGS: The heart size and mediastinal contours are within normal limits. Both lungs are clear. The visualized skeletal structures are unremarkable.  IMPRESSION: No active cardiopulmonary disease.  Electronically Signed   By: Inez Catalina M.D.   On: 12/31/2013 21:11   Ct Head Wo Contrast  12/31/2013   CLINICAL DATA:  Facial injury after fall; loss of consciousness.  EXAM: CT HEAD WITHOUT CONTRAST  TECHNIQUE: Contiguous axial images were obtained from the base of the skull through the vertex without intravenous contrast.  COMPARISON:  None.  FINDINGS: Bony calvarium appears intact. Small right temporal scalp hematoma is noted. No mass effect or midline shift is noted. Ventricular size is within normal limits. There is no evidence of mass lesion, hemorrhage or acute infarction.  IMPRESSION: Small right temporal scalp hematoma. No acute intracranial abnormality seen.   Electronically Signed   By: Sabino Dick M.D.   On: 12/31/2013 21:07    EKG Interpretation    Date/Time:  Saturday December 31 2013 20:42:46 EST Ventricular Rate:  74 PR Interval:  146 QRS Duration: 74 QT Interval:  398 QTC Calculation: 441 R Axis:   44 Text Interpretation:  Normal sinus rhythm Normal ECG No previous ECGs  available Confirmed by [facility]  MD, Jony Ladnier ([id number]) on 12/31/2013 9:02:03 PM            MDM   1. Syncope   2. Chin laceration, initial encounter   3. Minor head injury with loss of consciousness, initial encounter   4. Diarrhea   5. GERD (gastroesophageal reflux disease)   6. Hyperlipidemia   7. Hypertension    The patient appears reasonably stabilized for admission considering the current resources, flow, and capabilities available in the ED at this time, and I doubt any other [facility] requiring further screening and/or treatment in the ED prior to admission.    Babette Relic, MD 01/01/14 [id number]

## 2013-12-31 NOTE — H&P (Signed)
[facility] History and Physical  Stacey Moody:[id number] DOB: 1946/09/13 DOA: 12/31/2013  Referring physician:  PCP: Odette Fraction, MD  Specialists:   Chief Complaint: syncope   HPI: Stacey Moody is a 68 y.o. female with PMH of HTN, GERD, HPL had upper respiratory infection with cough, dizziness, then nausea, vomiting diarrhea for 3-4 days complicated with syncopal episode today; this evening went to the bathroom and is the last thing she remembered other than waking up on the floor; denies chest pain, no SOB, no seizure activity, no focal neuro symptoms;   Review of Systems: The patient denies anorexia, fever, weight loss,, vision loss, decreased hearing, hoarseness, chest pain, dyspnea on exertion, peripheral edema, balance deficits, hemoptysis, abdominal pain, melena, hematochezia, severe indigestion/heartburn, hematuria, incontinence, genital sores, muscle weakness, suspicious skin lesions, transient blindness, difficulty walking, depression, unusual weight change, abnormal bleeding, enlarged lymph nodes, angioedema, and breast masses.   Past Medical History  Diagnosis Date  . Carotid artery occlusion   . Osteopenia   . Hypertension   . Hyperlipidemia   . GERD (gastroesophageal reflux disease)    Past Surgical History  Procedure Laterality Date  . Abdominal hysterectomy  1982    Partial hysterectomy  . Small intestine surgery     Social History:  reports that she quit smoking about 17 years ago. She has never used smokeless tobacco. She reports that she does not drink alcohol or use illicit drugs. Home;  where does patient live--home, ALF, SNF? and with whom if at home? Yes;  Can patient participate in ADLs?  Allergies  Allergen Reactions  . Tetanus Toxoids Swelling    Family History  Problem Relation Age of Onset  . Cancer Mother     pancratic  . Heart attack Mother   . Cancer Father     stomach  . Diabetes Father   . Hyperlipidemia Father   . Cancer  Sister     lung  . Cancer Brother     thyroid, prostate  . Cancer Sister     colon  . Cancer Sister     colon    (be sure to complete)  Prior to Admission medications   Medication Sig Start Date End Date Taking? Authorizing Provider  aspirin 325 MG tablet Take 325 mg by mouth daily.   Yes Historical Provider, MD  cholecalciferol (VITAMIN D) 1000 UNITS tablet Take 1,000 Units by mouth daily.   Yes Historical Provider, MD  hydrochlorothiazide (HYDRODIURIL) 25 MG tablet Take 1 tablet (25 mg total) by mouth daily. 04/25/13  Yes Susy Frizzle, MD  losartan (COZAAR) 50 MG tablet Take 1 tablet (50 mg total) by mouth daily. 05/09/13  Yes Susy Frizzle, MD  rosuvastatin (CRESTOR) 20 MG tablet Take 20 mg by mouth every evening.   Yes Historical Provider, MD  azithromycin (ZITHROMAX) 250 MG tablet 250-500 mg See admin instructions. Take two tablets by mouth on day 1, then take one tablet on days 2 through 5. 5 day course starting on 12/21/2013 12/21/13   Historical Provider, MD   Physical Exam: Filed Vitals:   12/31/13 2300  BP: 138/48  Pulse: 79  Temp:   Resp: 19     General:  alert  Eyes: EOM-I, perrla   ENT: no oral ulcers   Neck: supple   Cardiovascular: s1,s2 rrr  Respiratory: CTA BL  Abdomen: soft, nt, nd   Skin: no rash  Musculoskeletal: no LE edema  Psychiatric: no hallucinations  Neurologic: CN 2-12 intact,  motor 5/5 BL  Labs on Admission:  Basic Metabolic Panel:  Recent Labs Lab 12/31/13 2010  NA 134*  K 3.7  CL 95*  CO2 22  GLUCOSE 116*  BUN 20  CREATININE 0.78  CALCIUM 9.2   Liver Function Tests:  Recent Labs Lab 12/31/13 2010  AST 62*  ALT 41*  ALKPHOS 77  BILITOT 0.3  PROT 7.5  ALBUMIN 4.1   No results found for this basename: LIPASE, AMYLASE,  in the last 168 hours No results found for this basename: AMMONIA,  in the last 168 hours CBC:  Recent Labs Lab 12/31/13 2010  WBC 6.3  NEUTROABS 5.3  HGB 14.0  HCT 41.1  MCV 94.5   PLT 192   Cardiac Enzymes: No results found for this basename: CKTOTAL, CKMB, CKMBINDEX, TROPONINI,  in the last 168 hours  BNP (last 3 results) No results found for this basename: PROBNP,  in the last 8760 hours CBG: No results found for this basename: GLUCAP,  in the last 168 hours  Radiological Exams on Admission: Dg Chest 2 View  12/31/2013   CLINICAL DATA:  Recent traumatic injury with pain  EXAM: CHEST  2 VIEW  COMPARISON:  07/13/2006  FINDINGS: The heart size and mediastinal contours are within normal limits. Both lungs are clear. The visualized skeletal structures are unremarkable.  IMPRESSION: No active cardiopulmonary disease.   Electronically Signed   By: Inez Catalina M.D.   On: 12/31/2013 21:11   Ct Head Wo Contrast  12/31/2013   CLINICAL DATA:  Facial injury after fall; loss of consciousness.  EXAM: CT HEAD WITHOUT CONTRAST  TECHNIQUE: Contiguous axial images were obtained from the base of the skull through the vertex without intravenous contrast.  COMPARISON:  None.  FINDINGS: Bony calvarium appears intact. Small right temporal scalp hematoma is noted. No mass effect or midline shift is noted. Ventricular size is within normal limits. There is no evidence of mass lesion, hemorrhage or acute infarction.  IMPRESSION: Small right temporal scalp hematoma. No acute intracranial abnormality seen.   Electronically Signed   By: Sabino Dick M.D.   On: 12/31/2013 21:07    EKG: Independently reviewed. NSR, no acute ST/T changes  Assessment/Plan Principal Problem:   Syncope Active Problems:   Diarrhea   URI (upper respiratory infection)   Hypertension   GERD (gastroesophageal reflux disease)  68 y.o. female with PMH of HTN, GERD, HPL had upper respiratory infection with cough, dizziness, then nausea, vomiting diarrhea for 3-4 days complicated with syncopal episode  1. Syncope likely vasovagal on top dehydration/URI viral illness;  -neuro exam no focal; CT: no acute findings,  except hematoma; obtain orthostatics, IVF, supportive care; monitor on tele r/o arrythmia; -check UA r/o UTI  2. URI; CXR: no clear infiltrate; obtain influenza pcr; cont supportive care  3. HTN cont home regimen; hold diuretics   4. Nausea, vomiting diarrhea probable viral etiology; cont IVF, supportive care; check c diff due to recent outpatient atx exposure   None  if consultant consulted, please document name and whether formally or informally consulted  Code Status: full (must indicate code status--if unknown or must be presumed, indicate so) Family Communication: d/w patient, her daughter  (indicate person spoken with, if applicable, with phone number if by telephone) Disposition Plan: home 24-48 hours (indicate anticipated LOS)  Time spent: >35 minutes   Kinnie Feil [facility] Pager [phone]  If 7PM-7AM, please contact night-coverage [email or web address] Password [facility] 12/31/2013, 11:47 PM

## 2014-01-01 ENCOUNTER — Encounter (HOSPITAL_COMMUNITY): Payer: Self-pay | Admitting: Internal Medicine

## 2014-01-01 DIAGNOSIS — J101 Influenza due to other identified influenza virus with other respiratory manifestations: Secondary | ICD-10-CM | POA: Diagnosis present

## 2014-01-01 DIAGNOSIS — R55 Syncope and collapse: Secondary | ICD-10-CM

## 2014-01-01 DIAGNOSIS — S0003XA Contusion of scalp, initial encounter: Secondary | ICD-10-CM

## 2014-01-01 HISTORY — DX: Influenza due to other identified influenza virus with other respiratory manifestations: J10.1

## 2014-01-01 LAB — URINALYSIS, ROUTINE W REFLEX MICROSCOPIC
Bilirubin Urine: NEGATIVE
Glucose, UA: NEGATIVE mg/dL
Ketones, ur: NEGATIVE mg/dL
LEUKOCYTES UA: NEGATIVE
NITRITE: NEGATIVE
PROTEIN: NEGATIVE mg/dL
SPECIFIC GRAVITY, URINE: 1.01 (ref 1.005–1.030)
Urobilinogen, UA: 0.2 mg/dL (ref 0.0–1.0)
pH: 6 (ref 5.0–8.0)

## 2014-01-01 LAB — URINE MICROSCOPIC-ADD ON

## 2014-01-01 LAB — INFLUENZA PANEL BY PCR (TYPE A & B)
H1N1 flu by pcr: NOT DETECTED
INFLBPCR: NEGATIVE
Influenza A By PCR: POSITIVE — AB

## 2014-01-01 MED ORDER — ASPIRIN 325 MG PO TABS
325.0000 mg | ORAL_TABLET | Freq: Every day | ORAL | Status: DC
  Administered 2014-01-01 – 2014-01-03 (×3): 325 mg via ORAL
  Filled 2014-01-01 (×3): qty 1

## 2014-01-01 MED ORDER — ONDANSETRON HCL 4 MG PO TABS: 4.0000 mg | ORAL_TABLET | Freq: Four times a day (QID) | ORAL | Status: DC | PRN

## 2014-01-01 MED ORDER — ENOXAPARIN SODIUM 30 MG/0.3ML ~~LOC~~ SOLN
30.0000 mg | SUBCUTANEOUS | Status: DC
  Administered 2014-01-02: 30 mg via SUBCUTANEOUS
  Filled 2014-01-01: qty 0.3

## 2014-01-01 MED ORDER — LOSARTAN POTASSIUM 50 MG PO TABS
50.0000 mg | ORAL_TABLET | Freq: Every day | ORAL | Status: DC
  Administered 2014-01-01 – 2014-01-03 (×3): 50 mg via ORAL
  Filled 2014-01-01 (×3): qty 1

## 2014-01-01 MED ORDER — OSELTAMIVIR PHOSPHATE 75 MG PO CAPS
75.0000 mg | ORAL_CAPSULE | Freq: Two times a day (BID) | ORAL | Status: DC
  Administered 2014-01-01 – 2014-01-03 (×5): 75 mg via ORAL
  Filled 2014-01-01 (×5): qty 1

## 2014-01-01 MED ORDER — VITAMIN D 1000 UNITS PO TABS
1000.0000 [IU] | ORAL_TABLET | Freq: Every day | ORAL | Status: DC
  Administered 2014-01-01 – 2014-01-03 (×3): 1000 [IU] via ORAL
  Filled 2014-01-01 (×3): qty 1

## 2014-01-01 MED ORDER — ATORVASTATIN CALCIUM 10 MG PO TABS
10.0000 mg | ORAL_TABLET | Freq: Every day | ORAL | Status: DC
  Administered 2014-01-01 – 2014-01-02 (×2): 10 mg via ORAL
  Filled 2014-01-01 (×2): qty 1

## 2014-01-01 MED ORDER — ACETAMINOPHEN 650 MG RE SUPP: 650.0000 mg | Freq: Four times a day (QID) | RECTAL | Status: DC | PRN

## 2014-01-01 MED ORDER — SODIUM CHLORIDE 0.9 % IJ SOLN
3.0000 mL | Freq: Two times a day (BID) | INTRAMUSCULAR | Status: DC
  Administered 2014-01-02: 3 mL via INTRAVENOUS

## 2014-01-01 MED ORDER — POTASSIUM CHLORIDE IN NACL 20-0.9 MEQ/L-% IV SOLN
INTRAVENOUS | Status: DC
  Administered 2014-01-01 – 2014-01-02 (×4): via INTRAVENOUS

## 2014-01-01 MED ORDER — ALBUTEROL SULFATE (2.5 MG/3ML) 0.083% IN NEBU: 2.5000 mg | INHALATION_SOLUTION | RESPIRATORY_TRACT | Status: DC | PRN

## 2014-01-01 MED ORDER — SODIUM CHLORIDE 0.9 % IV SOLN
INTRAVENOUS | Status: DC
  Administered 2014-01-01 (×2): via INTRAVENOUS

## 2014-01-01 MED ORDER — MORPHINE SULFATE 2 MG/ML IJ SOLN
2.0000 mg | INTRAMUSCULAR | Status: DC | PRN
  Administered 2014-01-01 (×2): 2 mg via INTRAVENOUS
  Filled 2014-01-01 (×2): qty 1

## 2014-01-01 MED ORDER — ACETAMINOPHEN 325 MG PO TABS
650.0000 mg | ORAL_TABLET | Freq: Four times a day (QID) | ORAL | Status: DC | PRN
  Administered 2014-01-01 – 2014-01-02 (×3): 650 mg via ORAL
  Filled 2014-01-01 (×3): qty 2

## 2014-01-01 MED ORDER — ONDANSETRON HCL 4 MG/2ML IJ SOLN
4.0000 mg | Freq: Four times a day (QID) | INTRAMUSCULAR | Status: DC | PRN
  Administered 2014-01-01 – 2014-01-02 (×3): 4 mg via INTRAVENOUS
  Filled 2014-01-01 (×3): qty 2

## 2014-01-01 NOTE — Progress Notes (Signed)
Patient has multiple skin tears received from fall earlier today.  Patient has a 2 x 3 cm laceration to chin, a skin tear to left thumb, a skin tear to right forearm, and a skin tear to elbow.  All skin tears are covered with opsites.

## 2014-01-01 NOTE — Progress Notes (Signed)
The patient is a 68 year old woman who was admitted this morning for syncopal episode. She was briefly seen and examined. Her chart, laboratory studies, and vital signs were reviewed. She is currently alert, hemodynamically stable, and oriented. Her influenza A is positive and therefore Tamiflu was started. Her urinalysis is negative. C. difficile PCR is pending. She is noted to have elevated liver transaminases. This will be followed. We'll continue IV fluid hydration and supportive treatment. We'll add potassium chloride to the IV fluids. We'll add morphine as needed for scalp pain/headache. For further evaluation, we'll order orthostatic vital signs, 2-D echocardiogram, carotid ultrasound, TSH, and vitamin B12 level.

## 2014-01-02 ENCOUNTER — Observation Stay (HOSPITAL_COMMUNITY): Payer: Medicare Other

## 2014-01-02 DIAGNOSIS — H6690 Otitis media, unspecified, unspecified ear: Secondary | ICD-10-CM | POA: Diagnosis not present

## 2014-01-02 DIAGNOSIS — R55 Syncope and collapse: Secondary | ICD-10-CM | POA: Diagnosis not present

## 2014-01-02 DIAGNOSIS — I658 Occlusion and stenosis of other precerebral arteries: Secondary | ICD-10-CM | POA: Diagnosis not present

## 2014-01-02 DIAGNOSIS — J111 Influenza due to unidentified influenza virus with other respiratory manifestations: Secondary | ICD-10-CM | POA: Diagnosis not present

## 2014-01-02 DIAGNOSIS — R42 Dizziness and giddiness: Secondary | ICD-10-CM

## 2014-01-02 DIAGNOSIS — D72819 Decreased white blood cell count, unspecified: Secondary | ICD-10-CM

## 2014-01-02 DIAGNOSIS — I517 Cardiomegaly: Secondary | ICD-10-CM

## 2014-01-02 DIAGNOSIS — H6691 Otitis media, unspecified, right ear: Secondary | ICD-10-CM | POA: Diagnosis present

## 2014-01-02 LAB — COMPREHENSIVE METABOLIC PANEL
ALBUMIN: 3 g/dL — AB (ref 3.5–5.2)
ALT: 25 U/L (ref 0–35)
AST: 35 U/L (ref 0–37)
Alkaline Phosphatase: 55 U/L (ref 39–117)
BILIRUBIN TOTAL: 0.2 mg/dL — AB (ref 0.3–1.2)
BUN: 10 mg/dL (ref 6–23)
CHLORIDE: 107 meq/L (ref 96–112)
CO2: 20 meq/L (ref 19–32)
CREATININE: 0.6 mg/dL (ref 0.50–1.10)
Calcium: 8.1 mg/dL — ABNORMAL LOW (ref 8.4–10.5)
GFR calc Af Amer: >90 mL/min (ref >90)
Glucose, Bld: 98 mg/dL (ref 70–99)
POTASSIUM: 3.8 meq/L (ref 3.7–5.3)
SODIUM: 139 meq/L (ref 137–147)
Total Protein: 5.7 g/dL — ABNORMAL LOW (ref 6.0–8.3)

## 2014-01-02 LAB — CBC
HCT: 31.2 % — ABNORMAL LOW (ref 36.0–46.0)
Hemoglobin: 11.2 g/dL — ABNORMAL LOW (ref 12.0–15.0)
MCH: 33.9 pg (ref 26.0–34.0)
MCHC: 35.9 g/dL (ref 30.0–36.0)
MCV: 94.5 fL (ref 78.0–100.0)
PLATELETS: 137 10*3/uL — AB (ref 150–400)
RBC: 3.3 MIL/uL — ABNORMAL LOW (ref 3.87–5.11)
RDW: 12.8 % (ref 11.5–15.5)
WBC: 2.5 10*3/uL — ABNORMAL LOW (ref 4.0–10.5)

## 2014-01-02 LAB — TSH: TSH: 4.447 u[IU]/mL (ref 0.350–4.500)

## 2014-01-02 LAB — VITAMIN B12: Vitamin B-12: 412 pg/mL (ref 211–911)

## 2014-01-02 MED ORDER — ENOXAPARIN SODIUM 40 MG/0.4ML ~~LOC~~ SOLN
40.0000 mg | Freq: Every day | SUBCUTANEOUS | Status: DC
  Administered 2014-01-03: 40 mg via SUBCUTANEOUS
  Filled 2014-01-02 (×2): qty 0.4

## 2014-01-02 MED ORDER — AMOXICILLIN 250 MG PO CAPS
500.0000 mg | ORAL_CAPSULE | Freq: Three times a day (TID) | ORAL | Status: DC
  Administered 2014-01-02 – 2014-01-03 (×4): 500 mg via ORAL
  Filled 2014-01-02 (×4): qty 2

## 2014-01-02 MED ORDER — SALINE SPRAY 0.65 % NA SOLN
1.0000 | NASAL | Status: DC | PRN
  Filled 2014-01-02: qty 44

## 2014-01-02 MED ORDER — FLUTICASONE PROPIONATE 50 MCG/ACT NA SUSP
1.0000 | Freq: Every day | NASAL | Status: DC
  Administered 2014-01-02 – 2014-01-03 (×2): 1 via NASAL
  Filled 2014-01-02: qty 16

## 2014-01-02 NOTE — Progress Notes (Signed)
PT Cancellation Note  Patient Details Name: Stacey Moody MRN: [id number] DOB: 1946-04-12   Cancelled Treatment:    Reason Eval/Treat Not Completed: PT screened, no needs identified, will sign off   Sable Feil 01/02/2014, 3:35 PM

## 2014-01-02 NOTE — Progress Notes (Signed)
UR completed. Patient changed to inpatient r/t requiring IVF @ 100cc/hr  

## 2014-01-02 NOTE — Progress Notes (Addendum)
[facility] PROGRESS NOTE  CODI FOLKERTS FAO:[id number] DOB: [birth year] DOA: 12/31/2013 PCP: Odette Fraction, MD    Code Status: Full code Family Communication: Discussed with started yesterday, not available currently. Disposition Plan: To be determined.   Consultants:  None.  Procedures: 2-D echocardiogram-Study Conclusions  - Procedure narrative: Transthoracic echocardiography. Image quality was suboptimal. The study was technically difficult, as a result of poor sound wave transmission. - Left ventricle: The cavity size was normal. Wall thickness was increased in a pattern of moderate LVH. Systolic function was normal. The estimated ejection fraction was in the range of 60% to 65%. Wall motion was normal; there were no regional wall motion abnormalities. Transthoracic echocardiography. M-mode, complete 2D, spectral Doppler, and color Doppler. Height: Height: 160cm. Height: 63in. Weight: Weight: 66.2kg. Weight: 145.7lb. Body mass index: BMI: 25.9kg/m^2. Body surface area: BSA: 1.55m^2. Blood pressure: 140/45. Patient status: Inpatient. Location: Bedside.   Antibiotics:  Amoxicillin 01/02/2014.  (Antiviral) Tamiflu 01/01/2014   HPI/Subjective: The patient complained of a brief episode of vertigo when she stood up. She has not sure she stood up too quickly. She feels a "fullness" in her right ear. She also has some nasal congestion and feels as if she's getting a sinus infection. She denies headache. She denies difficulty speaking or difficulty swallowing. She has some right side pain from the fall at home.  Objective: Filed Vitals:   01/02/14 0508  BP: 132/69  Pulse: 70  Temp: 98.1 F (36.7 C)  Resp: 16    Intake/Output Summary (Last 24 hours) at 01/02/14 1656 Last data filed at 01/02/14 0900  Gross per 24 hour  Intake    260 ml  Output      0 ml  Net    260 ml   Filed Weights   12/31/13 2018 01/01/14 0100 01/02/14 0508  Weight: 61.236 kg (135 lb)  66.225 kg (146 lb) 69.7 kg (153 lb 10.6 oz)    Exam:   General:  Pleasant alert 68 year old Caucasian woman in no acute distress.  HEENT: She has a mild soft resolving hematoma right temporal scalp, mildly tender. She has a small laceration on her chin, not bleeding and no surrounding drainage. Tympanic membrane on the right has mildly obscured light reflex query fluid. Tympanic membrane on the left is obscured by tan colored debris.  Neck: Bruits versus radiating murmur on the right.  Cardiovascular: S1, S2, with a soft systolic murmur.  Respiratory: Clear to auscultation bilaterally.  Abdomen: Mildly obese, positive bowel sounds, soft, nontender, nondistended.  Musculoskeletal: No acute hot red joints.   Skin: She has a few superficial skin tears on her right forearm and her elbow. None actively bleeding.  Neurologic/psychiatric: She is alert and oriented x3. Cranial nerves II through XII are intact. No nystagmus currently. Her speech is clear. Her affect is pleasant.  Data Reviewed: Basic Metabolic Panel:  Recent Labs Lab 12/31/13 2010 01/02/14 0603  NA 134* 139  K 3.7 3.8  CL 95* 107  CO2 22 20  GLUCOSE 116* 98  BUN 20 10  CREATININE 0.78 0.60  CALCIUM 9.2 8.1*   Liver Function Tests:  Recent Labs Lab 12/31/13 2010 01/02/14 0603  AST 62* 35  ALT 41* 25  ALKPHOS 77 55  BILITOT 0.3 0.2*  PROT 7.5 5.7*  ALBUMIN 4.1 3.0*   No results found for this basename: LIPASE, AMYLASE,  in the last 168 hours No results found for this basename: AMMONIA,  in the last 168 hours CBC:  Recent  Labs Lab 12/31/13 2010 01/02/14 0603  WBC 6.3 2.5*  NEUTROABS 5.3  --   HGB 14.0 11.2*  HCT 41.1 31.2*  MCV 94.5 94.5  PLT 192 137*   Cardiac Enzymes: No results found for this basename: CKTOTAL, CKMB, CKMBINDEX, TROPONINI,  in the last 168 hours BNP (last 3 results) No results found for this basename: PROBNP,  in the last 8760 hours CBG: No results found for this  basename: GLUCAP,  in the last 168 hours  No results found for this or any previous visit (from the past 240 hour(s)).   Studies: Dg Chest 2 View  12/31/2013   CLINICAL DATA:  Recent traumatic injury with pain  EXAM: CHEST  2 VIEW  COMPARISON:  07/13/2006  FINDINGS: The heart size and mediastinal contours are within normal limits. Both lungs are clear. The visualized skeletal structures are unremarkable.  IMPRESSION: No active cardiopulmonary disease.   Electronically Signed   By: Inez Catalina M.D.   On: 12/31/2013 21:11   Ct Head Wo Contrast  12/31/2013   CLINICAL DATA:  Facial injury after fall; loss of consciousness.  EXAM: CT HEAD WITHOUT CONTRAST  TECHNIQUE: Contiguous axial images were obtained from the base of the skull through the vertex without intravenous contrast.  COMPARISON:  None.  FINDINGS: Bony calvarium appears intact. Small right temporal scalp hematoma is noted. No mass effect or midline shift is noted. Ventricular size is within normal limits. There is no evidence of mass lesion, hemorrhage or acute infarction.  IMPRESSION: Small right temporal scalp hematoma. No acute intracranial abnormality seen.   Electronically Signed   By: Sabino Dick M.D.   On: 12/31/2013 21:07   US Carotid Duplex Bilateral  01/02/2014   CLINICAL DATA:  Syncope  EXAM: BILATERAL CAROTID DUPLEX ULTRASOUND  TECHNIQUE: Pearline Cables scale imaging, color Doppler and duplex ultrasound were performed of bilateral carotid and vertebral arteries in the neck.  COMPARISON:  Carotid Doppler ultrasound dated May 20, 2012  FINDINGS: Criteria: Quantification of carotid stenosis is based on velocity parameters that correlate the residual internal carotid diameter with NASCET-based stenosis levels, using the diameter of the distal internal carotid lumen as the denominator for stenosis measurement.  The following velocity measurements were obtained:  RIGHT  ICA:  01/26/2009 cm/sec  CCA:  619 cm/sec  SYSTOLIC ICA/CCA RATIO:  2.5   DIASTOLIC ICA/CCA RATIO:  5.09  ECA:  220 cm/sec  LEFT  ICA:  233 cm/sec  CCA:  326 cm/sec  SYSTOLIC ICA/CCA RATIO:  7.12  DIASTOLIC ICA/CCA RATIO:  4.58  ECA:  232 cm/sec  RIGHT CAROTID ARTERY: There is significant calcified plaque at the carotid bulb and proximal ICA.  RIGHT VERTEBRAL ARTERY:  Normal in flow direction bilaterally  LEFT CAROTID ARTERY: There is significant calcified plaque throughout the left carotid system especially in the ICA  LEFT VERTEBRAL ARTERY:  Normal in flow direction bilaterally.  IMPRESSION: 1. Again demonstrated is significant calcified plaque within both carotid systems with degrees of stenosis in the range of 50-75% bilaterally. The right systolic ICA /CC ratio has increased from 1.8 to 2.5; 2. The vertebral arteries are normal in flow direction bilaterally.   Electronically Signed   By: David  Martinique   On: 01/02/2014 15:19    Scheduled Meds: . amoxicillin  500 mg Oral Q8H  . aspirin  325 mg Oral Daily  . atorvastatin  10 mg Oral q1800  . cholecalciferol  1,000 Units Oral Daily  . [START ON 01/03/2014] enoxaparin (LOVENOX) injection  40 mg Subcutaneous Daily  . fluticasone  1 spray Each Nare Daily  . losartan  50 mg Oral Daily  . oseltamivir  75 mg Oral BID  . sodium chloride  3 mL Intravenous Q12H   Continuous Infusions: . 0.9 % NaCl with KCl 20 mEq / L 100 mL/hr at 01/02/14 1533   Assessment:  Principal Problem:   Syncope Active Problems:   Hypertension   GERD (gastroesophageal reflux disease)   Diarrhea   URI (upper respiratory infection)   Influenza A   Scalp hematoma   Vertigo   Right otitis media   1. Syncope. Etiology unknown, but hypovolemia in the setting of influenza A infection/upper respiratory infection/otitis media is a consideration. Orthostatic vital signs were ordered but have not been performed yet. She is already received over 2-3 L of IV fluids since admission. Of note, her vitamin B12 level is within normal limits. Her TSH is within  normal limits. Her urinalysis is not indicative of infection. She has no diarrhea currently, although C. difficile PCR has been ordered. Her carotid ultrasound reveals mild ICA stenosis. Her 2-D echocardiogram reveals preserved left ventricular systolic function with no significant valvular abnormalities.  Suspicion of hypovolemia and orthostasis. Hydrochlorothiazide is being held. Her blood pressure has been relatively normal off of hydrochlorothiazide, but she is being maintained on her ARB. She has been well hydrated. Her blood pressures trending up slightly.  Query vertigo. This may be secondary to her underlying infection. This will be monitored.  Influenza A infection. We'll continue Tamiflu. Suspicion of right otitis media. Amoxicillin was started today. Flomax nasal spray was also ordered.  Mild carotid artery stenosis. She has a known history of this. Continue statin and aspirin.  Leukopenia. The patient's white blood cell count was within normal limits on admission. It has decreased. This decrease may be secondary to hemodilution, the viral infection, or Tamiflu. As above, her TSH and vitamin B12 levels are within normal limits. Her white blood cell count will be monitored closely.  Right chest wall pains secondary to fall. Her chest x-ray revealed no obvious broken ribs. Clinically, she does not appear to have fractured ribs.  Scalp hematoma and skin tears, secondary to fall. These are noninfected.    Plan: 1. Continue added treatment as above. 2. We'll decrease her IV fluids. 3. We'll ask the nursing staff to obtain orthostatic vital signs now and in the morning although the patient appears to be well-hydrated. 4. Possible discharge tomorrow.   Time spent: 35 minutes.    [facility] Pager [phone]. If 7PM-7AM, please contact night-coverage at [email or web address], password [facility] 01/02/2014, 4:56 PM  LOS: 2 days

## 2014-01-02 NOTE — Progress Notes (Signed)
*  PRELIMINARY RESULTS* Echocardiogram 2D Echocardiogram has been performed.  [place], [place] 01/02/2014, 10:28 AM

## 2014-01-03 ENCOUNTER — Encounter: Payer: Self-pay | Admitting: Family Medicine

## 2014-01-03 ENCOUNTER — Other Ambulatory Visit: Payer: Self-pay | Admitting: Family Medicine

## 2014-01-03 DIAGNOSIS — J111 Influenza due to unidentified influenza virus with other respiratory manifestations: Secondary | ICD-10-CM | POA: Diagnosis not present

## 2014-01-03 DIAGNOSIS — R55 Syncope and collapse: Secondary | ICD-10-CM | POA: Diagnosis not present

## 2014-01-03 DIAGNOSIS — D61818 Other pancytopenia: Secondary | ICD-10-CM

## 2014-01-03 DIAGNOSIS — I6529 Occlusion and stenosis of unspecified carotid artery: Secondary | ICD-10-CM

## 2014-01-03 LAB — CBC
HCT: 33.7 % — ABNORMAL LOW (ref 36.0–46.0)
HEMOGLOBIN: 11.9 g/dL — AB (ref 12.0–15.0)
MCH: 33.4 pg (ref 26.0–34.0)
MCHC: 35.3 g/dL (ref 30.0–36.0)
MCV: 94.7 fL (ref 78.0–100.0)
Platelets: 138 10*3/uL — ABNORMAL LOW (ref 150–400)
RBC: 3.56 MIL/uL — ABNORMAL LOW (ref 3.87–5.11)
RDW: 12.8 % (ref 11.5–15.5)
WBC: 2.5 10*3/uL — AB (ref 4.0–10.5)

## 2014-01-03 MED ORDER — SALINE SPRAY 0.65 % NA SOLN: 1.0000 | NASAL | Status: DC | PRN

## 2014-01-03 MED ORDER — FLUTICASONE PROPIONATE 50 MCG/ACT NA SUSP: 1.0000 | Freq: Every day | NASAL | Status: DC

## 2014-01-03 MED ORDER — AMOXICILLIN 500 MG PO CAPS: 500.0000 mg | ORAL_CAPSULE | Freq: Two times a day (BID) | ORAL | Status: DC

## 2014-01-03 MED ORDER — OSELTAMIVIR PHOSPHATE 75 MG PO CAPS: 75.0000 mg | ORAL_CAPSULE | Freq: Two times a day (BID) | ORAL | Status: DC

## 2014-01-03 NOTE — Telephone Encounter (Signed)
Pt is coming in on Tuesday for a visit with dr pickard from a hospital visit she had and the dr there was wanting her to come in and have BW done and she is wanting to know if she needs to be fasting for that Call back number is [phone]

## 2014-01-03 NOTE — Progress Notes (Signed)
Patient with orders to be discharge home. Discharge instructions given, patient verbalized understanding. Prescriptions given. Patient stable. Patient left with son-in-law in private vehicle.

## 2014-01-03 NOTE — Discharge Summary (Signed)
Physician Discharge Summary  VERCIE POKORNY YQI:[id number] DOB: 10-25-1946 DOA: 12/31/2013  PCP: Odette Fraction, MD  Admit date: 12/31/2013 Discharge date: 01/03/2014  Time spent: Greater than 30 minutes  Recommendations for Outpatient Follow-up:  1. The patient will need her CBC reassessed at her appointment for follow up of pancytopenia.  Discharge Diagnoses:  1. Syncope, likely secondary to hypovolemia and orthostatic changes in the setting of infections and/or vasovagal. 2. Mild scalp hematoma, small laceration on chin, and skin tears on right arm, secondary to fall. 3. Upper respiratory infection with influenza A. 4. Right otitis media. 4. Carotid artery stenosis, estimated at 50-75% bilaterally. 5. Pancytopenia which developed during the hospital course. Platelet count 138, WBC 2.5, and hemoglobin 11.9 at the time of discharge. Etiology uncertain, possibly secondary to hemodilution, Tamiflu, or infections. 6. Hypertension. Remained stable. 7. Moderate LVH per 2-D echocardiogram. Ejection fraction 60-65%.  Discharge Condition: Improved.  Diet recommendation: Heart healthy.  Filed Weights   01/02/14 0508 01/03/14 0500 01/03/14 0608  Weight: 69.7 kg (153 lb 10.6 oz) 69.809 kg (153 lb 14.4 oz) 69.6 kg (153 lb 7 oz)    History of present illness:   Stacey Moody is a 68 y.o. female with PMH of HTN and carotid artery stenosis  She presented with a cough, dizziness, then nausea, vomiting, and diarrhea for 3-4 days. She also passed out in the bathroom. She remembered waking up on the floor. She had no preceding headache, chest pain or palpitations. She fell on her right side, hitting her head and scraping her arm. She complained of right sided chest pain after the fall. In the ED, she was abefrile and hemodynamically stable. Her labs revealed slightly low sodium; mild hepatic transaminitis and normal CBC. CT of her head revealed "small right temporal scalp hematoma; no acute intracranial  abnormality seen". Her CXR revealed " the heart size and mediastinal contours are within normal limits; both lungs are clear; the visualized skeletal structures are unremarkable". She was admitted for further management.     Hospital Course:   Syncope There were no focal neurological deficits noted during admission. There was a suspicion of hypovolemia and orthostasis or a vasovagal response while the patient was in the bathroom at home.Marland Kitchen Hydrochlorothiazide was held.  She was started on IV fluids for hydration. A number of studies were ordered for evaluation. Influenza panel was positive for Influenza A. Tamiflu was started. Orthostatic vital signs were normal, but they were not obtained until after she had already received over 2-3 L of IV fluids since admission. Her vitamin B12 level was within normal limits. Her TSH was within normal limits. Her urinalysis was not indicative of infection. She had no diarrhea or bowel movements, although C. difficile PCR had been ordered. Her carotid ultrasound revealed 50-75% ICA stenosis which was essentially stable from the previous study. Her 2-D echocardiogram revealed preserved left ventricular systolic function with no significant valvular abnormalities. The patient had no syncopal episodes during the hospital course.   Influenza A infection and suspicion of right otitis media/URI. The patient's symptomatology was consistent with the flu.  She was started Tamiflu when the Influenza panel became positive for influenza A. She also complained of sinus congestion and right ear fullness. She also had vertigo for a few seconds. Following the exam, it appeared that she had a mild right otitis media. Amoxicillin and Flonase were started. She was discharged on all of the above to finish the recommended course.  Bilateral carotid artery stenosis.  As above, she has a known history of this. Statin and aspirin were continued.   Right chest wall pain and scalp hematoma  and skin tears secondary to fall.  She was treated supportively. Clear dressings were placed on the skin tears. Analgesics were given as needed. Her chest x-ray revealed no obvious broken ribs. Clinically, she did not appear to have fractured ribs.  Pancytopenia.  The patient's blood cell counts were within normal limits on admission. They decreased but stabilized with a WBC of 2.5; platelet count of 138 and RBC/Hb of 3.56/11.9. . This decrease may have been secondary to hemodilution, the viral infection, or Tamiflu. As above, her TSH and vitamin B12 levels were within normal limits. I consulted with the pharmacist to see if Tamiflu had been documented as causing pancytopenia as I had another patient who developed lower blood counts on Tamiflu. Per his review, Tamiflu has been shown to cause pancytopenia, but it was not a common side effect. The patient will need her CBC rechecked at her PCP follow up appointment. The patient voiced understanding. Treatment continued with Tamiflu as I believed the benefit of treating the influenza outweighed the risk.   Procedures:  2-D echocardiogram-Study Conclusions  - Procedure narrative: Transthoracic echocardiography. Image quality was suboptimal. The study was technically difficult, as a result of poor sound wave transmission. - Left ventricle: The cavity size was normal. Wall thickness was increased in a pattern of moderate LVH. Systolic function was normal. The estimated ejection fraction was in the range of 60% to 65%. Wall motion was normal; there were no regional wall motion abnormalities. Transthoracic echocardiography. M-mode, complete 2D, spectral Doppler, and color Doppler. Height: Height: 160cm. Height: 63in. Weight: Weight: 66.2kg. Weight: 145.7lb. Body mass index: BMI: 25.9kg/m^2. Body surface area: BSA: 1.9m^2. Blood pressure: 140/45. Patient status: Inpatient. Location: Bedside.   Consultations:  None  Discharge Exam: Filed  Vitals:   01/03/14 0850  BP: 147/51  Pulse: 68  Temp: 97.8 F (36.6 C)  Resp: 18    General: Pleasant alert 68 year old Caucasian woman in no acute distress.  HEENT: She has a mild soft resolving hematoma right temporal scalp, mildly tender. She has a small laceration on her chin, not bleeding and no surrounding drainage. Tympanic membrane on the right has mildly obscured light reflex query fluid. Tympanic membrane on the left is obscured by tan colored debris.  Neck: Bruits versus radiating murmur on the right.  Cardiovascular: S1, S2, with a soft systolic murmur.  Respiratory: Clear to auscultation bilaterally.  Abdomen: Mildly obese, positive bowel sounds, soft, nontender, nondistended.  Musculoskeletal: No acute hot red joints.  Skin: She has a few superficial skin tears on her right forearm and her elbow. None actively bleeding.  Neurologic/psychiatric: She is alert and oriented x3. Cranial nerves II through XII are intact. No nystagmus. Her speech is clear. Her affect is pleasant.     Discharge Instructions  Discharge Orders   Future Appointments Provider Department Dept Phone   01/10/2014 8:45 AM Susy Frizzle, MD [place] [phone]   07/04/2014 12:30 PM [facility] [place] [facility] [address] [phone]   07/04/2014 1:30 PM Rosetta Posner, MD [facility] [place] [phone]   Future Orders Complete By Expires   Diet - low sodium heart healthy  As directed    Discharge instructions  As directed    Comments:     You'll need your CBC or blood counts rechecked at your followup appointment. Avoid driving  for 3 days. Avoid dehydration. Drink at least 6 glasses of fluids daily.   Increase activity slowly  As directed        Medication List    STOP taking these medications       azithromycin 250 MG tablet  Commonly known as:  ZITHROMAX      TAKE these medications       amoxicillin 500 MG capsule  Commonly  known as:  AMOXIL  Take 1 capsule (500 mg total) by mouth 2 (two) times daily. Take for 6 more days.     aspirin 325 MG tablet  Take 325 mg by mouth daily.     cholecalciferol 1000 UNITS tablet  Commonly known as:  VITAMIN D  Take 1,000 Units by mouth daily.     fluticasone 50 MCG/ACT nasal spray  Commonly known as:  FLONASE  Place 1 spray into both nostrils daily.     hydrochlorothiazide 25 MG tablet  Commonly known as:  HYDRODIURIL  Take 1 tablet (25 mg total) by mouth daily.     losartan 50 MG tablet  Commonly known as:  COZAAR  Take 1 tablet (50 mg total) by mouth daily.     oseltamivir 75 MG capsule  Commonly known as:  TAMIFLU  Take 1 capsule (75 mg total) by mouth 2 (two) times daily. Take until completion over the next 2-1/2 days.     rosuvastatin 20 MG tablet  Commonly known as:  CRESTOR  Take 20 mg by mouth every evening.     sodium chloride 0.65 % Soln nasal spray  Commonly known as:  OCEAN  Place 1 spray into both nostrils as needed for congestion.       Allergies  Allergen Reactions  . Tetanus Toxoids Swelling       Follow-up Information   Call [place] TOM, MD. (Call to make an appointment be seen early next week.)    Specialty:  Family Medicine   Contact information:   [address] [place] [address] [phone]        The results of significant diagnostics from this hospitalization (including imaging, microbiology, ancillary and laboratory) are listed below for reference.    Significant Diagnostic Studies: Dg Chest 2 View  12/31/2013   CLINICAL DATA:  Recent traumatic injury with pain  EXAM: CHEST  2 VIEW  COMPARISON:  07/13/2006  FINDINGS: The heart size and mediastinal contours are within normal limits. Both lungs are clear. The visualized skeletal structures are unremarkable.  IMPRESSION: No active cardiopulmonary disease.   Electronically Signed   By: Inez Catalina M.D.   On: 12/31/2013 21:11   Ct Head Wo  Contrast  12/31/2013   CLINICAL DATA:  Facial injury after fall; loss of consciousness.  EXAM: CT HEAD WITHOUT CONTRAST  TECHNIQUE: Contiguous axial images were obtained from the base of the skull through the vertex without intravenous contrast.  COMPARISON:  None.  FINDINGS: Bony calvarium appears intact. Small right temporal scalp hematoma is noted. No mass effect or midline shift is noted. Ventricular size is within normal limits. There is no evidence of mass lesion, hemorrhage or acute infarction.  IMPRESSION: Small right temporal scalp hematoma. No acute intracranial abnormality seen.   Electronically Signed   By: Sabino Dick M.D.   On: 12/31/2013 21:07   US Carotid Duplex Bilateral  01/02/2014   CLINICAL DATA:  Syncope  EXAM: BILATERAL CAROTID DUPLEX ULTRASOUND  TECHNIQUE: Pearline Cables scale imaging, color Doppler and duplex ultrasound were  performed of bilateral carotid and vertebral arteries in the neck.  COMPARISON:  Carotid Doppler ultrasound dated May 20, 2012  FINDINGS: Criteria: Quantification of carotid stenosis is based on velocity parameters that correlate the residual internal carotid diameter with NASCET-based stenosis levels, using the diameter of the distal internal carotid lumen as the denominator for stenosis measurement.  The following velocity measurements were obtained:  RIGHT  ICA:  01/26/2009 cm/sec  CCA:  191 cm/sec  SYSTOLIC ICA/CCA RATIO:  2.5  DIASTOLIC ICA/CCA RATIO:  4.78  ECA:  220 cm/sec  LEFT  ICA:  233 cm/sec  CCA:  295 cm/sec  SYSTOLIC ICA/CCA RATIO:  6.21  DIASTOLIC ICA/CCA RATIO:  3.08  ECA:  232 cm/sec  RIGHT CAROTID ARTERY: There is significant calcified plaque at the carotid bulb and proximal ICA.  RIGHT VERTEBRAL ARTERY:  Normal in flow direction bilaterally  LEFT CAROTID ARTERY: There is significant calcified plaque throughout the left carotid system especially in the ICA  LEFT VERTEBRAL ARTERY:  Normal in flow direction bilaterally.  IMPRESSION: 1. Again demonstrated is  significant calcified plaque within both carotid systems with degrees of stenosis in the range of 50-75% bilaterally. The right systolic ICA /CC ratio has increased from 1.8 to 2.5; 2. The vertebral arteries are normal in flow direction bilaterally.   Electronically Signed   By: David  Martinique   On: 01/02/2014 15:19    Microbiology: No results found for this or any previous visit (from the past 240 hour(s)).   Labs: Basic Metabolic Panel:  Recent Labs Lab 12/31/13 2010 01/02/14 0603  NA 134* 139  K 3.7 3.8  CL 95* 107  CO2 22 20  GLUCOSE 116* 98  BUN 20 10  CREATININE 0.78 0.60  CALCIUM 9.2 8.1*   Liver Function Tests:  Recent Labs Lab 12/31/13 2010 01/02/14 0603  AST 62* 35  ALT 41* 25  ALKPHOS 77 55  BILITOT 0.3 0.2*  PROT 7.5 5.7*  ALBUMIN 4.1 3.0*   No results found for this basename: LIPASE, AMYLASE,  in the last 168 hours No results found for this basename: AMMONIA,  in the last 168 hours CBC:  Recent Labs Lab 12/31/13 2010 01/02/14 0603 01/03/14 0625  WBC 6.3 2.5* 2.5*  NEUTROABS 5.3  --   --   HGB 14.0 11.2* 11.9*  HCT 41.1 31.2* 33.7*  MCV 94.5 94.5 94.7  PLT 192 137* 138*   Cardiac Enzymes: No results found for this basename: CKTOTAL, CKMB, CKMBINDEX, TROPONINI,  in the last 168 hours BNP: BNP (last 3 results) No results found for this basename: PROBNP,  in the last 8760 hours CBG: No results found for this basename: GLUCAP,  in the last 168 hours     Signed:  Diamon Reddinger  [facility] 01/03/2014, 6:43 PM

## 2014-01-04 ENCOUNTER — Encounter (HOSPITAL_COMMUNITY): Payer: Self-pay | Admitting: Internal Medicine

## 2014-01-04 NOTE — Telephone Encounter (Signed)
This encounter was created in error - please disregard.

## 2014-01-10 ENCOUNTER — Ambulatory Visit (INDEPENDENT_AMBULATORY_CARE_PROVIDER_SITE_OTHER): Payer: Medicare Other | Admitting: Family Medicine

## 2014-01-10 ENCOUNTER — Encounter: Payer: Self-pay | Admitting: Family Medicine

## 2014-01-10 VITALS — BP 120/60 | HR 72 | Temp 97.6°F | Resp 14 | Ht 64.0 in | Wt 142.0 lb

## 2014-01-10 DIAGNOSIS — R55 Syncope and collapse: Secondary | ICD-10-CM | POA: Diagnosis not present

## 2014-01-10 DIAGNOSIS — I10 Essential (primary) hypertension: Secondary | ICD-10-CM | POA: Diagnosis not present

## 2014-01-10 DIAGNOSIS — Z09 Encounter for follow-up examination after completed treatment for conditions other than malignant neoplasm: Secondary | ICD-10-CM

## 2014-01-10 LAB — BASIC METABOLIC PANEL WITH GFR
BUN: 16 mg/dL (ref 6–23)
CALCIUM: 9.4 mg/dL (ref 8.4–10.5)
CHLORIDE: 103 meq/L (ref 96–112)
CO2: 25 meq/L (ref 19–32)
Creat: 0.62 mg/dL (ref 0.50–1.10)
GFR, Est African American: >89 mL/min
GFR, Est Non African American: >89 mL/min
GLUCOSE: 93 mg/dL (ref 70–99)
Potassium: 4.1 mEq/L (ref 3.5–5.3)
Sodium: 138 mEq/L (ref 135–145)

## 2014-01-10 LAB — CBC WITH DIFFERENTIAL/PLATELET
Basophils Absolute: 0 10*3/uL (ref 0.0–0.1)
Basophils Relative: 0 % (ref 0–1)
EOS ABS: 0.1 10*3/uL (ref 0.0–0.7)
EOS PCT: 1 % (ref 0–5)
HCT: 38 % (ref 36.0–46.0)
HEMOGLOBIN: 13.2 g/dL (ref 12.0–15.0)
LYMPHS PCT: 25 % (ref 12–46)
Lymphs Abs: 1.5 10*3/uL (ref 0.7–4.0)
MCH: 32 pg (ref 26.0–34.0)
MCHC: 34.7 g/dL (ref 30.0–36.0)
MCV: 92.2 fL (ref 78.0–100.0)
MONO ABS: 0.8 10*3/uL (ref 0.1–1.0)
MONOS PCT: 12 % (ref 3–12)
Neutro Abs: 3.9 10*3/uL (ref 1.7–7.7)
Neutrophils Relative %: 62 % (ref 43–77)
PLATELETS: 304 10*3/uL (ref 150–400)
RBC: 4.12 MIL/uL (ref 3.87–5.11)
RDW: 13.6 % (ref 11.5–15.5)
WBC: 6.2 10*3/uL (ref 4.0–10.5)

## 2014-01-10 MED ORDER — HYDROCODONE-ACETAMINOPHEN 5-325 MG PO TABS: 1.0000 | ORAL_TABLET | Freq: Four times a day (QID) | ORAL | Status: DC | PRN

## 2014-01-10 NOTE — Progress Notes (Signed)
Subjective:    Patient ID: Stacey Moody, female    DOB: [birth year], 68 y.o.   MRN: [id number]  HPI Patient was sick with influenza A. She became extremely dehydrated. She stood to go to the restroom one week ago and experienced syncope. She fell down a flight of stairs. She was admitted to the hospital where she was monitored on telemetry without any events.  Echocardiogram of the heart revealed no signs of cardiomyopathy. She had stable bilateral carotid artery stenosis 59-70%. She did develop pancytopenia on Tamiflu.  She was vigorously rehydrated in the hospital. Since coming home from the hospital her biggest concern has been severe pain. She has significant bruising in the right shoulder, right chest, right cheek, right temporal, left cheek. She has significant pain and tenderness in the right chest. She reports pleurisy.  She has terrible pain with coughing.  She denies any hemoptysis. She denies any shortness of breath. She is due to have a CBC rechecked to make sure the pancytopenia is improving. She is still somewhat dizzy. Her blood pressure is slightly low. She has resumed losartan as well as the hydrochlorothiazide. Past Medical History  Diagnosis Date  . Carotid artery occlusion   . Osteopenia   . Hypertension   . Hyperlipidemia   . GERD (gastroesophageal reflux disease)   . Influenza A 01/01/2014  . Syncope 12/31/2013    Vasovagal or hypovolemia/orthostatic   Past Surgical History  Procedure Laterality Date  . Abdominal hysterectomy  1982    Partial hysterectomy  . Small intestine surgery     Current Outpatient Prescriptions on File Prior to Visit  Medication Sig Dispense Refill  . aspirin 325 MG tablet Take 325 mg by mouth daily.      . cholecalciferol (VITAMIN D) 1000 UNITS tablet Take 1,000 Units by mouth daily.      . CRESTOR 20 MG tablet TAKE 1 TABLET BY MOUTH DAILY  30 tablet  0  . fluticasone (FLONASE) 50 MCG/ACT nasal spray Place 1 spray into both nostrils daily.        . hydrochlorothiazide (HYDRODIURIL) 25 MG tablet TAKE 1 TABLET BY MOUTH EVERY DAY  30 tablet  0  . losartan (COZAAR) 50 MG tablet Take 1 tablet (50 mg total) by mouth daily.  30 tablet  11  . rosuvastatin (CRESTOR) 20 MG tablet Take 20 mg by mouth every evening.      . sodium chloride (OCEAN) 0.65 % SOLN nasal spray Place 1 spray into both nostrils as needed for congestion.       No current facility-administered medications on file prior to visit.   Allergies  Allergen Reactions  . Tetanus Toxoids Swelling   History   Social History  . Marital Status: Widowed    Spouse Name: N/A    Number of Children: N/A  . Years of Education: N/A   Occupational History  . Not on file.   Social History Main Topics  . Smoking status: Former Smoker    Quit date: 12/22/1996  . Smokeless tobacco: Never Used  . Alcohol Use: No  . Drug Use: No  . Sexual Activity: Not on file   Other Topics Concern  . Not on file   Social History Narrative  . No narrative on file      Review of Systems  All other systems reviewed and are negative.       Objective:   Physical Exam  Vitals reviewed. Constitutional: She appears well-developed and well-nourished. No distress.  HENT:  Head: Head is with abrasion and with contusion.  Eyes: Conjunctivae are normal. Pupils are equal, round, and reactive to light. No scleral icterus.  Neck: Normal range of motion. Neck supple. No JVD present. No thyromegaly present.  Cardiovascular: Normal rate, regular rhythm and normal heart sounds.   Pulmonary/Chest: Effort normal and breath sounds normal. No respiratory distress. She has no wheezes. She has no rales. She exhibits tenderness.  Abdominal: Soft. Bowel sounds are normal. She exhibits no distension. There is no tenderness. There is no rebound.  Lymphadenopathy:    She has no cervical adenopathy.   patient has significant bruising on her right cheek, her chin, her left cheek, right chest, and her right  shoulder.       Assessment & Plan:  1. Hospital discharge follow-up  - HYDROcodone-acetaminophen (NORCO/VICODIN) 5-325 MG per tablet; Take 1 tablet by mouth every 6 (six) hours as needed for moderate pain.  Dispense: 30 tablet; Refill: 0 - BASIC METABOLIC PANEL WITH GFR - CBC with Differential  2. Unspecified essential hypertension   3. Syncope and collapse I believe the patient's syncope was due to orthostatic hypotension from dehydration. I recommended the patient temporarily discontinue hydrochlorothiazide for the next two weeks until she is stronger.   I will check a CBC today to make sure her pancytopenia is improving.I will also check a BMP. I will treat her significant pain from her right chest wall contusion with hydrocodone 5/325 one by mouth every 6 hours as needed for pain.The remainder of her exam is normal. I believe this was an isolated event triggered by dehydration, viral illness, hypotension, and her carotid stenosis which contributed to cerebral hypoperfusion causing her collapse.

## 2014-02-04 ENCOUNTER — Other Ambulatory Visit: Payer: Self-pay | Admitting: Family Medicine

## 2014-02-14 ENCOUNTER — Telehealth: Payer: Self-pay | Admitting: Family Medicine

## 2014-02-14 ENCOUNTER — Emergency Department (HOSPITAL_COMMUNITY)
Admission: EM | Admit: 2014-02-14 | Discharge: 2014-02-14 | Disposition: A | Discharge status: Home / Self Care | Payer: Medicare Other | Attending: Emergency Medicine | Admitting: Emergency Medicine

## 2014-02-14 ENCOUNTER — Telehealth: Payer: Self-pay | Admitting: *Deleted

## 2014-02-14 DIAGNOSIS — R112 Nausea with vomiting, unspecified: Secondary | ICD-10-CM | POA: Diagnosis not present

## 2014-02-14 DIAGNOSIS — R55 Syncope and collapse: Secondary | ICD-10-CM | POA: Insufficient documentation

## 2014-02-14 DIAGNOSIS — I10 Essential (primary) hypertension: Secondary | ICD-10-CM | POA: Diagnosis not present

## 2014-02-14 DIAGNOSIS — Z87891 Personal history of nicotine dependence: Secondary | ICD-10-CM | POA: Insufficient documentation

## 2014-02-14 DIAGNOSIS — E785 Hyperlipidemia, unspecified: Secondary | ICD-10-CM | POA: Insufficient documentation

## 2014-02-14 DIAGNOSIS — Z7982 Long term (current) use of aspirin: Secondary | ICD-10-CM | POA: Insufficient documentation

## 2014-02-14 DIAGNOSIS — R5381 Other malaise: Secondary | ICD-10-CM | POA: Diagnosis not present

## 2014-02-14 DIAGNOSIS — Z8739 Personal history of other diseases of the musculoskeletal system and connective tissue: Secondary | ICD-10-CM | POA: Insufficient documentation

## 2014-02-14 DIAGNOSIS — IMO0002 Reserved for concepts with insufficient information to code with codable children: Secondary | ICD-10-CM | POA: Insufficient documentation

## 2014-02-14 DIAGNOSIS — Z8709 Personal history of other diseases of the respiratory system: Secondary | ICD-10-CM | POA: Insufficient documentation

## 2014-02-14 DIAGNOSIS — R51 Headache: Secondary | ICD-10-CM | POA: Insufficient documentation

## 2014-02-14 DIAGNOSIS — R42 Dizziness and giddiness: Secondary | ICD-10-CM | POA: Diagnosis not present

## 2014-02-14 DIAGNOSIS — Z79899 Other long term (current) drug therapy: Secondary | ICD-10-CM | POA: Insufficient documentation

## 2014-02-14 DIAGNOSIS — H538 Other visual disturbances: Secondary | ICD-10-CM | POA: Insufficient documentation

## 2014-02-14 DIAGNOSIS — Z8719 Personal history of other diseases of the digestive system: Secondary | ICD-10-CM | POA: Diagnosis not present

## 2014-02-14 DIAGNOSIS — R5383 Other fatigue: Secondary | ICD-10-CM | POA: Diagnosis not present

## 2014-02-14 LAB — COMPREHENSIVE METABOLIC PANEL
ALK PHOS: 68 U/L (ref 39–117)
ALT: 15 U/L (ref 0–35)
AST: 22 U/L (ref 0–37)
Albumin: 4.4 g/dL (ref 3.5–5.2)
BILIRUBIN TOTAL: 0.3 mg/dL (ref 0.3–1.2)
BUN: 14 mg/dL (ref 6–23)
CHLORIDE: 100 meq/L (ref 96–112)
CO2: 24 mEq/L (ref 19–32)
CREATININE: 0.74 mg/dL (ref 0.50–1.10)
Calcium: 10 mg/dL (ref 8.4–10.5)
GFR calc Af Amer: >90 mL/min (ref >90)
GFR calc non Af Amer: 85 mL/min — ABNORMAL LOW (ref >90)
Glucose, Bld: 119 mg/dL — ABNORMAL HIGH (ref 70–99)
Potassium: 3.4 mEq/L — ABNORMAL LOW (ref 3.7–5.3)
Sodium: 141 mEq/L (ref 137–147)
TOTAL PROTEIN: 7.2 g/dL (ref 6.0–8.3)

## 2014-02-14 LAB — URINALYSIS, ROUTINE W REFLEX MICROSCOPIC
BILIRUBIN URINE: NEGATIVE
Glucose, UA: NEGATIVE mg/dL
HGB URINE DIPSTICK: NEGATIVE
Ketones, ur: NEGATIVE mg/dL
Nitrite: NEGATIVE
PROTEIN: NEGATIVE mg/dL
Specific Gravity, Urine: 1.014 (ref 1.005–1.030)
UROBILINOGEN UA: 1 mg/dL (ref 0.0–1.0)
pH: 7.5 (ref 5.0–8.0)

## 2014-02-14 LAB — CBC WITH DIFFERENTIAL/PLATELET
BASOS PCT: 0 % (ref 0–1)
Basophils Absolute: 0 10*3/uL (ref 0.0–0.1)
EOS ABS: 0 10*3/uL (ref 0.0–0.7)
Eosinophils Relative: 1 % (ref 0–5)
HEMATOCRIT: 36.2 % (ref 36.0–46.0)
Hemoglobin: 12.7 g/dL (ref 12.0–15.0)
LYMPHS ABS: 1.8 10*3/uL (ref 0.7–4.0)
Lymphocytes Relative: 32 % (ref 12–46)
MCH: 32.4 pg (ref 26.0–34.0)
MCHC: 35.1 g/dL (ref 30.0–36.0)
MCV: 92.3 fL (ref 78.0–100.0)
MONO ABS: 0.5 10*3/uL (ref 0.1–1.0)
MONOS PCT: 8 % (ref 3–12)
NEUTROS PCT: 59 % (ref 43–77)
Neutro Abs: 3.3 10*3/uL (ref 1.7–7.7)
Platelets: 216 10*3/uL (ref 150–400)
RBC: 3.92 MIL/uL (ref 3.87–5.11)
RDW: 13 % (ref 11.5–15.5)
WBC: 5.6 10*3/uL (ref 4.0–10.5)

## 2014-02-14 LAB — CBG MONITORING, ED: Glucose-Capillary: 127 mg/dL — ABNORMAL HIGH (ref 70–99)

## 2014-02-14 LAB — I-STAT TROPONIN, ED: TROPONIN I, POC: 0.02 ng/mL (ref 0.00–0.08)

## 2014-02-14 LAB — URINE MICROSCOPIC-ADD ON

## 2014-02-14 MED ORDER — SODIUM CHLORIDE 0.9 % IV BOLUS (SEPSIS)
1000.0000 mL | Freq: Once | INTRAVENOUS | Status: AC
  Administered 2014-02-14: 1000 mL via INTRAVENOUS

## 2014-02-14 MED ORDER — DOXAZOSIN MESYLATE 4 MG PO TABS: 4.0000 mg | ORAL_TABLET | Freq: Every day | ORAL | Status: DC

## 2014-02-14 MED ORDER — ONDANSETRON HCL 4 MG/2ML IJ SOLN
4.0000 mg | Freq: Once | INTRAMUSCULAR | Status: AC
  Administered 2014-02-14: 4 mg via INTRAVENOUS
  Filled 2014-02-14: qty 2

## 2014-02-14 NOTE — Telephone Encounter (Signed)
Continue HCTZ, increase losartan 100 mg poqday and start cardura 4 mg poqday and recheck BP here in 2 weeks.

## 2014-02-14 NOTE — ED Notes (Signed)
Pt alert x4 respirations easy non labored.  

## 2014-02-14 NOTE — ED Provider Notes (Signed)
CSN: [id number]     Arrival date & time 02/14/14  1458 History   First MD Initiated Contact with Patient 02/14/14 1511     No chief complaint on file.  HPI Comments: 68 yo F hx of HTN, HLD, bilateral carotid artery stenosis presents with CC of syncope.  Pt has had elevated BP yesterday and today up to 034 systolic, high 74'Q diastolic.  She called her PCP today and was told to double her Lorsartan, and was prescribed Doxazosin 4 mg tablet as well, which she took around 1120.  She took BP today around 1-2 PM and noted BP low of 90/50's.  30 minutes prior to arrival pt had syncopal episode in car, daugther witnessed, which lasted approximately 5 minutes.  Before this event, pt states she had some blurry vision, felt fatigued.  She denied headache, lightheadedness, vertigo, CP, palpitations, SOB, or any other presyncopal symptoms.  After regaining consciousness, pt felt nauseated and vomited X 1.  Currently pt only complains of some nausea, and fatigue.  Of note pt was hospitalized last month for syncopal episode, minor head injury.  She denies any recent illness, or head trauma otherwise.    The history is provided by the patient and a relative. No language interpreter was used.    Past Medical History  Diagnosis Date  . Carotid artery occlusion   . Osteopenia   . Hypertension   . Hyperlipidemia   . GERD (gastroesophageal reflux disease)   . Influenza A 01/01/2014  . Syncope 12/31/2013    Vasovagal or hypovolemia/orthostatic   Past Surgical History  Procedure Laterality Date  . Abdominal hysterectomy  1982    Partial hysterectomy  . Small intestine surgery     Family History  Problem Relation Age of Onset  . Cancer Mother     pancratic  . Heart attack Mother   . Cancer Father     stomach  . Diabetes Father   . Hyperlipidemia Father   . Cancer Sister     lung  . Cancer Brother     thyroid, prostate  . Cancer Sister     colon  . Cancer Sister     colon   History  Substance Use  Topics  . Smoking status: Former Smoker    Quit date: 12/22/1996  . Smokeless tobacco: Never Used  . Alcohol Use: No   OB History   Grav Para Term Preterm Abortions TAB SAB Ect Mult Living                 Review of Systems  Constitutional: Positive for fatigue. Negative for fever, chills and diaphoresis.  Respiratory: Negative for cough and shortness of breath.   Cardiovascular: Negative for chest pain, palpitations and leg swelling.  Gastrointestinal: Positive for nausea. Negative for vomiting, abdominal pain and diarrhea.  Genitourinary: Negative for dysuria.  Musculoskeletal: Negative for myalgias.  Skin: Negative for rash.  Neurological: Positive for syncope, light-headedness and headaches. Negative for dizziness, weakness and numbness.  Hematological: Negative for adenopathy. Does not bruise/bleed easily.  All other systems reviewed and are negative.      Allergies  Tetanus toxoids  Home Medications   Current Outpatient Rx  Name  Route  Sig  Dispense  Refill  . aspirin 325 MG tablet   Oral   Take 325 mg by mouth daily.         . cholecalciferol (VITAMIN D) 1000 UNITS tablet   Oral   Take 1,000 Units by mouth daily.         Marland Kitchen  CRESTOR 20 MG tablet      TAKE 1 TABLET BY MOUTH DAILY   30 tablet   5   . doxazosin (CARDURA) 4 MG tablet   Oral   Take 1 tablet (4 mg total) by mouth daily.   30 tablet   3   . fluticasone (FLONASE) 50 MCG/ACT nasal spray   Each Nare   Place 1 spray into both nostrils daily.         . hydrochlorothiazide (HYDRODIURIL) 25 MG tablet      TAKE 1 TABLET BY MOUTH EVERY DAY   30 tablet   5   . HYDROcodone-acetaminophen (NORCO/VICODIN) 5-325 MG per tablet   Oral   Take 1 tablet by mouth every 6 (six) hours as needed for moderate pain.   30 tablet   0   . losartan (COZAAR) 50 MG tablet   Oral   Take 1 tablet (50 mg total) by mouth daily.   30 tablet   11   . rosuvastatin (CRESTOR) 20 MG tablet   Oral   Take 20 mg  by mouth every evening.         . sodium chloride (OCEAN) 0.65 % SOLN nasal spray   Each Nare   Place 1 spray into both nostrils as needed for congestion.          There were no vitals taken for this visit. Physical Exam  Nursing note and vitals reviewed. Constitutional: She is oriented to person, place, and time. She appears well-developed and well-nourished.  HENT:  Head: Normocephalic and atraumatic.  Right Ear: External ear normal.  Left Ear: External ear normal.  Nose: Nose normal.  Mouth/Throat: Oropharynx is clear and moist.  Eyes: Conjunctivae and EOM are normal. Pupils are equal, round, and reactive to light.  Neck: Normal range of motion. Neck supple.  Cardiovascular: Normal rate, regular rhythm, normal heart sounds and intact distal pulses.   Pulmonary/Chest: Effort normal and breath sounds normal. No respiratory distress. She has no wheezes. She has no rales. She exhibits no tenderness.  Abdominal: Soft. Bowel sounds are normal. She exhibits no distension and no mass. There is no tenderness. There is no rebound and no guarding.  Musculoskeletal: Normal range of motion.  Neurological: She is alert and oriented to person, place, and time.  No speech deficit.  Grossly normal vision.  Grossly normal hearing.  No tongue deviation.  Normal shoulder shrug bilaterally.  No gross sensory or motor deficits in bilateral upper or lower extremities.    Skin: Skin is warm and dry.    ED Course  Procedures (including critical care time) Labs Review Labs Reviewed  COMPREHENSIVE METABOLIC PANEL - Abnormal; Notable for the following:    Potassium 3.4 (*)    Glucose, Bld 119 (*)    GFR calc non Af Amer 85 (*)    All other components within normal limits  URINALYSIS, ROUTINE W REFLEX MICROSCOPIC - Abnormal; Notable for the following:    Leukocytes, UA SMALL (*)    All other components within normal limits  URINE MICROSCOPIC-ADD ON - Abnormal; Notable for the following:    Casts  HYALINE CASTS (*)    All other components within normal limits  CBG MONITORING, ED - Abnormal; Notable for the following:    Glucose-Capillary 127 (*)    All other components within normal limits  CBC WITH DIFFERENTIAL  I-STAT TROPOININ, ED   Imaging Review No results found.  EKG Interpretation    Date/Time:  Tuesday  February 14 2014 15:07:26 EST Ventricular Rate:  86 PR Interval:  134 QRS Duration: 82 QT Interval:  390 QTC Calculation: 466 R Axis:   64 Text Interpretation:  Normal sinus rhythm Normal ECG Confirmed by BEATON  MD, ROBERT ([id number]) on 02/14/2014 3:43:49 PM            MDM   Final diagnoses:  None   68 yo F hx of HTN, HLD, bilateral carotid artery stenosis presents with CC of syncope.   Filed Vitals:   02/14/14 1518  BP: 115/59  Pulse: 86  Temp: 96.7 F (35.9 C)  Resp: 20   Physical exam as above.  Pt currently normotensive, and rest of vitals WNL.  Neuro exam demonstrates no focal deficits.  EKG as above, NSR, no ischemic changes.  CBG 127.  CBC, CMP, Troponin, UA all WNL.  Pt received NS bolus, zofran, with significant improvement in symptoms.  Pt has ambulated in the ED without difficulty, denies lightheadedness, CP, SOB, and states feels okay to go home.  Diagnosis of syncope 2/2 blood pressure medication increase.  Pt to be d/c home in good condition.  Encouraged to continue supportive care.  Pt advised to continue new medication as prescribed, but to take Doxazosin at night and Losartan in AM, to prevent hypotensive episodes.  F/u with PCP in 1 week.  Return precautions given.  Pt understands and agrees with plan.  I have discussed pt's care plan with Dr. Audie Pinto.  Sinda Du, MD      Sinda Du, MD 02/14/14 [phone]

## 2014-02-14 NOTE — ED Notes (Signed)
Pt here for Unresponsive episode, nausea, clammy, vomiting and left arm numbness,recent changes in bp meds., pulled from car in triage

## 2014-02-14 NOTE — Telephone Encounter (Signed)
Pt called stating that her BP is running high - 172/95,175/84,170/74,180/90,160/81,147/83,183/94. What would you like her to do?

## 2014-02-14 NOTE — Discharge Instructions (Signed)
Syncope  Syncope is a fainting spell. This means the person loses consciousness and drops to the ground. The person is generally unconscious for less than 5 minutes. The person may have some muscle twitches for up to 15 seconds before waking up and returning to normal. Syncope occurs more often in elderly people, but it can happen to anyone. While most causes of syncope are not dangerous, syncope can be a sign of a serious medical problem. It is important to seek medical care.   CAUSES   Syncope is caused by a sudden decrease in blood flow to the brain. The specific cause is often not determined. Factors that can trigger syncope include:   Taking medicines that lower blood pressure.   Sudden changes in posture, such as standing up suddenly.   Taking more medicine than prescribed.   Standing in one place for too long.   Seizure disorders.   Dehydration and excessive exposure to heat.   Low blood sugar (hypoglycemia).   Straining to have a bowel movement.   Heart disease, irregular heartbeat, or other circulatory problems.   Fear, emotional distress, seeing blood, or severe pain.  SYMPTOMS   Right before fainting, you may:   Feel dizzy or lightheaded.   Feel nauseous.   See all white or all black in your field of vision.   Have cold, clammy skin.  DIAGNOSIS   Your caregiver will ask about your symptoms, perform a physical exam, and perform electrocardiography (ECG) to record the electrical activity of your heart. Your caregiver may also perform other heart or blood tests to determine the cause of your syncope.  TREATMENT   In most cases, no treatment is needed. Depending on the cause of your syncope, your caregiver may recommend changing or stopping some of your medicines.  HOME CARE INSTRUCTIONS   Have someone stay with you until you feel stable.   Do not drive, operate machinery, or play sports until your caregiver says it is okay.   Keep all follow-up appointments as directed by your  caregiver.   Lie down right away if you start feeling like you might faint. Breathe deeply and steadily. Wait until all the symptoms have passed.   Drink enough fluids to keep your urine clear or pale yellow.   If you are taking blood pressure or heart medicine, get up slowly, taking several minutes to sit and then stand. This can reduce dizziness.  SEEK IMMEDIATE MEDICAL CARE IF:    You have a severe headache.   You have unusual pain in the chest, abdomen, or back.   You are bleeding from the mouth or rectum, or you have black or tarry stool.   You have an irregular or very fast heartbeat.   You have pain with breathing.   You have repeated fainting or seizure-like jerking during an episode.   You faint when sitting or lying down.   You have confusion.   You have difficulty walking.   You have severe weakness.   You have vision problems.  If you fainted, call your local emergency services (911 in [place] Do not drive yourself to the hospital.   MAKE SURE YOU:   Understand these instructions.   Will watch your condition.   Will get help right away if you are not doing well or get worse.  Document Released: 12/08/2005 Document Revised: 06/08/2012 Document Reviewed: 02/06/2012  [employer] Patient Information 2014 [employer].

## 2014-02-14 NOTE — ED Notes (Signed)
Family at bedside. 

## 2014-02-14 NOTE — Telephone Encounter (Signed)
Patient aware and med  Sent to pharm.

## 2014-02-14 NOTE — Telephone Encounter (Signed)
Received call from patient daughter. Reported that pt followed MD advise in regards to elevated BP (continue HCTZ, increase losartan 100 mg poqday and start cardura 4 mg poqday and recheck BP here in 2 weeks). Stated that Allayne Stack passed out and went to ER. Admitting BP in ER noted 115/59. Advised to continue with current F/U appointment.

## 2014-02-15 ENCOUNTER — Telehealth: Payer: Self-pay | Admitting: *Deleted

## 2014-02-15 NOTE — Telephone Encounter (Signed)
Stacey Moody states that in January, 2015 she "passed out" and fell down stairs and was hospitalized with "no injuries."  States she had carotid duplex done during her January 2015 hospitalization and that no changes were no changes were noted from carotid studies done at [facility] in July, 2014.  States she experienced elevated BP yesterday morning (180/90) and called her PCP who instructed her to continue her Losartan 100 mg daily and to start Cardura 4 mg daily which she took about 1120AM.  On 02-14-2014 pt. said she took BP 1-2 PM and it was 90/50.  She states yesterday after following changing her BP meds, she "blacked out for 3-5 minutes."  States she had "blurry vision" before blacking out yesterday.  States she went to [facility] ER yesterday (02-14-2014) and was seen by Dr. Sinda Du.  Pt. denied headache, lightheadedness, vertigo, palpitations, SOB, chest pain.  Admitting BP in ER was 115/59.  Dr. Justin Mend diagnosed syncope secondary to BP medication increase and advised [name] to continue new medication as prescribed by PCP but to take Doxazosin at night and Losartan in the morning to prevent hypotensive episodes and to follow up with PCP in one week. Stacey Moody followed up with her PCP following ER visit on 02-14-2014.  Reviewed carotid duplex done at [facility] 01-01-2014 and it showed no changes from carotid duplex from 06-2012 office visit with Dr. Donnetta Hutching.   Reviewed Dr. Jason Nest findings and instructions regarding medications with Stacey Moody and reviewed those with Stacey Moody who did not understand that she needed to take the Losartan in the morning and the Doxazosin at night to prevent hypotensive episodes.  Stacey Moody verbalized understanding of medication instructions.  Stacey Moody is to follow up with PCP in 1 week.  Reviewed signs/symptoms of stoke with Stacey Moody and asked that she call [facility] if she experienced any further problems or had any additional questions or concerns.

## 2014-02-19 NOTE — ED Provider Notes (Signed)
I saw and evaluated the patient, reviewed the resident's note and I agree with the findings and plan.   .Face to face Exam:  General:  Awake HEENT:  Atraumatic Resp:  Normal effort Abd:  Nondistended Neuro:No focal weakness   EKG was reviewed and discussed with resident   Dot Lanes, MD 02/19/14 1517

## 2014-02-27 ENCOUNTER — Encounter: Payer: Self-pay | Admitting: *Deleted

## 2014-02-28 ENCOUNTER — Ambulatory Visit (INDEPENDENT_AMBULATORY_CARE_PROVIDER_SITE_OTHER): Payer: Medicare Other | Admitting: Family Medicine

## 2014-02-28 ENCOUNTER — Encounter: Payer: Self-pay | Admitting: Family Medicine

## 2014-02-28 VITALS — BP 150/60 | HR 80 | Temp 97.5°F | Resp 14 | Ht 64.0 in | Wt 147.0 lb

## 2014-02-28 DIAGNOSIS — I10 Essential (primary) hypertension: Secondary | ICD-10-CM | POA: Diagnosis not present

## 2014-02-28 DIAGNOSIS — I6529 Occlusion and stenosis of unspecified carotid artery: Secondary | ICD-10-CM

## 2014-02-28 NOTE — Progress Notes (Signed)
Subjective:    Patient ID: Stacey Moody, female    DOB: 06-25-1946, 68 y.o.   MRN: [id number]  HPI  Patient's blood pressure has recently been elevated 150-180/60-90. We added doxazosin 4 mg by mouth daily to her other blood pressure medication. When she started taking that medication her blood pressure dropped too much to 90-100 over 60s. She felt lethargic. She has since stopped taking medication and her blood pressure has risen again. She denies any dietary changes. She's not taking any herbal supplements that may raise her blood pressure. She is undergoing stress. When recently checked her blood work in February and has no kidney problems. There is no explanation for her rise in blood pressure aside from her stress. Past Medical History  Diagnosis Date  . Carotid artery occlusion   . Osteopenia   . Hypertension   . Hyperlipidemia   . GERD (gastroesophageal reflux disease)   . Influenza A 01/01/2014  . Syncope 12/31/2013    Vasovagal or hypovolemia/orthostatic   Current Outpatient Prescriptions on File Prior to Visit  Medication Sig Dispense Refill  . aspirin 325 MG tablet Take 325 mg by mouth daily.      . cholecalciferol (VITAMIN D) 1000 UNITS tablet Take 1,000 Units by mouth daily.      . hydrochlorothiazide (HYDRODIURIL) 25 MG tablet Take 25 mg by mouth daily.      Marland Kitchen losartan (COZAAR) 50 MG tablet Take 1 tablet (50 mg total) by mouth daily.  30 tablet  11  . rosuvastatin (CRESTOR) 20 MG tablet Take 20 mg by mouth every evening.      Marland Kitchen doxazosin (CARDURA) 4 MG tablet Take 1 tablet (4 mg total) by mouth daily.  30 tablet  3   No current facility-administered medications on file prior to visit.   Allergies  Allergen Reactions  . Tetanus Toxoids Swelling   History   Social History  . Marital Status: Widowed    Spouse Name: N/A    Number of Children: N/A  . Years of Education: N/A   Occupational History  . Not on file.   Social History Main Topics  . Smoking status:  Former Smoker    Quit date: 12/22/1996  . Smokeless tobacco: Never Used  . Alcohol Use: No  . Drug Use: No  . Sexual Activity: Not on file   Other Topics Concern  . Not on file   Social History Narrative  . No narrative on file     Review of Systems  All other systems reviewed and are negative.       Objective:   Physical Exam  Vitals reviewed. Constitutional: She is oriented to person, place, and time. She appears well-developed and well-nourished.  Neck: Neck supple.  Cardiovascular: Normal rate, regular rhythm and normal heart sounds.   Pulmonary/Chest: Effort normal and breath sounds normal. No respiratory distress. She has no wheezes. She has no rales.  Abdominal: Soft. Bowel sounds are normal.  Lymphadenopathy:    She has no cervical adenopathy.  Neurological: She is alert and oriented to person, place, and time. She has normal reflexes. She displays normal reflexes. No cranial nerve deficit. She exhibits normal muscle tone. Coordination normal.  Psychiatric: She has a normal mood and affect. Her behavior is normal. Judgment and thought content normal.          Assessment & Plan:  1. HTN (hypertension) Continue losartan and hydrochlorothiazide. Decreased doxazosin 2 mg by mouth daily and recheck blood pressure in 2-3  weeks. Abstract on 02/27/2014  Component Date Value Ref Range Status  . HM Colonoscopy 12/22/2006 nml    Final  . HM Dexa Scan 05/03/2011 done    Final  Admission on 02/14/2014, Discharged on 02/14/2014  Component Date Value Ref Range Status  . Glucose-Capillary 02/14/2014 127* 70 - 99 mg/dL Final  . WBC 02/14/2014 5.6  4.0 - 10.5 K/uL Final  . RBC 02/14/2014 3.92  3.87 - 5.11 MIL/uL Final  . Hemoglobin 02/14/2014 12.7  12.0 - 15.0 g/dL Final  . HCT 02/14/2014 36.2  36.0 - 46.0 % Final  . MCV 02/14/2014 92.3  78.0 - 100.0 fL Final  . MCH 02/14/2014 32.4  26.0 - 34.0 pg Final  . MCHC 02/14/2014 35.1  30.0 - 36.0 g/dL Final  . RDW 02/14/2014  13.0  11.5 - 15.5 % Final  . Platelets 02/14/2014 216  150 - 400 K/uL Final  . Neutrophils Relative % 02/14/2014 59  43 - 77 % Final  . Neutro Abs 02/14/2014 3.3  1.7 - 7.7 K/uL Final  . Lymphocytes Relative 02/14/2014 32  12 - 46 % Final  . Lymphs Abs 02/14/2014 1.8  0.7 - 4.0 K/uL Final  . Monocytes Relative 02/14/2014 8  3 - 12 % Final  . Monocytes Absolute 02/14/2014 0.5  0.1 - 1.0 K/uL Final  . Eosinophils Relative 02/14/2014 1  0 - 5 % Final  . Eosinophils Absolute 02/14/2014 0.0  0.0 - 0.7 K/uL Final  . Basophils Relative 02/14/2014 0  0 - 1 % Final  . Basophils Absolute 02/14/2014 0.0  0.0 - 0.1 K/uL Final  . Sodium 02/14/2014 141  137 - 147 mEq/L Final  . Potassium 02/14/2014 3.4* 3.7 - 5.3 mEq/L Final  . Chloride 02/14/2014 100  96 - 112 mEq/L Final  . CO2 02/14/2014 24  19 - 32 mEq/L Final  . Glucose, Bld 02/14/2014 119* 70 - 99 mg/dL Final  . BUN 02/14/2014 14  6 - 23 mg/dL Final  . Creatinine, Ser 02/14/2014 0.74  0.50 - 1.10 mg/dL Final  . Calcium 02/14/2014 10.0  8.4 - 10.5 mg/dL Final  . Total Protein 02/14/2014 7.2  6.0 - 8.3 g/dL Final  . Albumin 02/14/2014 4.4  3.5 - 5.2 g/dL Final  . AST 02/14/2014 22  0 - 37 U/L Final  . ALT 02/14/2014 15  0 - 35 U/L Final  . Alkaline Phosphatase 02/14/2014 68  39 - 117 U/L Final  . Total Bilirubin 02/14/2014 0.3  0.3 - 1.2 mg/dL Final  . GFR calc non Af Amer 02/14/2014 85* >90 mL/min Final  . GFR calc Af Amer 02/14/2014 >90  >90 mL/min Final   Comment: (NOTE)                          The eGFR has been calculated using the CKD EPI equation.                          This calculation has not been validated in all clinical situations.                          eGFR's persistently <90 mL/min signify possible Chronic Kidney                          Disease.  . Troponin i, poc 02/14/2014 0.02  0.00 -  0.08 ng/mL Final  . Comment 3 02/14/2014          Final   Comment: Due to the release kinetics of cTnI,                          a  negative result within the first hours                          of the onset of symptoms does not rule out                          myocardial infarction with certainty.                          If myocardial infarction is still suspected,                          repeat the test at appropriate intervals.  . Color, Urine 02/14/2014 YELLOW  YELLOW Final  . APPearance 02/14/2014 CLEAR  CLEAR Final  . Specific Gravity, Urine 02/14/2014 1.014  1.005 - 1.030 Final  . pH 02/14/2014 7.5  5.0 - 8.0 Final  . Glucose, UA 02/14/2014 NEGATIVE  NEGATIVE mg/dL Final  . Hgb urine dipstick 02/14/2014 NEGATIVE  NEGATIVE Final  . Bilirubin Urine 02/14/2014 NEGATIVE  NEGATIVE Final  . Ketones, ur 02/14/2014 NEGATIVE  NEGATIVE mg/dL Final  . Protein, ur 02/14/2014 NEGATIVE  NEGATIVE mg/dL Final  . Urobilinogen, UA 02/14/2014 1.0  0.0 - 1.0 mg/dL Final  . Nitrite 02/14/2014 NEGATIVE  NEGATIVE Final  . Leukocytes, UA 02/14/2014 SMALL* NEGATIVE Final  . Squamous Epithelial / LPF 02/14/2014 RARE  RARE Final  . WBC, UA 02/14/2014 7-10  <3 WBC/hpf Final  . RBC / HPF 02/14/2014 0-2  <3 RBC/hpf Final  . Bacteria, UA 02/14/2014 RARE  RARE Final  . Casts 02/14/2014 HYALINE CASTS* NEGATIVE Final

## 2014-03-20 ENCOUNTER — Ambulatory Visit (INDEPENDENT_AMBULATORY_CARE_PROVIDER_SITE_OTHER): Payer: Medicare Other | Admitting: Family Medicine

## 2014-03-20 ENCOUNTER — Encounter: Payer: Self-pay | Admitting: Family Medicine

## 2014-03-20 VITALS — BP 126/60 | HR 78 | Temp 97.0°F | Resp 16 | Ht 64.0 in | Wt 148.0 lb

## 2014-03-20 DIAGNOSIS — I6529 Occlusion and stenosis of unspecified carotid artery: Secondary | ICD-10-CM | POA: Diagnosis not present

## 2014-03-20 DIAGNOSIS — J309 Allergic rhinitis, unspecified: Secondary | ICD-10-CM

## 2014-03-20 MED ORDER — FLUTICASONE PROPIONATE 50 MCG/ACT NA SUSP: 2.0000 | Freq: Every day | NASAL | Status: DC

## 2014-03-20 MED ORDER — PREDNISONE 20 MG PO TABS: ORAL_TABLET | ORAL | Status: DC

## 2014-03-20 NOTE — Progress Notes (Signed)
Subjective:    Patient ID: Stacey Moody, female    DOB: 13-Feb-1946, 68 y.o.   MRN: [id number]  HPI Patient has had 3 days of severe allergic rhinitis. She is having severe rhinorrhea. She is having sneezing. She is having left maxillary and left file sinus pressure. She is having a pressure sensation behind her left ear. She denies any fevers or chills. She denies any sinus pain. She is having postnasal drip causing a cough. She denies any shortness of breath. Past Medical History  Diagnosis Date  . Carotid artery occlusion   . Osteopenia   . Hypertension   . Hyperlipidemia   . GERD (gastroesophageal reflux disease)   . Influenza A 01/01/2014  . Syncope 12/31/2013    Vasovagal or hypovolemia/orthostatic   Current Outpatient Prescriptions on File Prior to Visit  Medication Sig Dispense Refill  . aspirin 325 MG tablet Take 325 mg by mouth daily.      . cholecalciferol (VITAMIN D) 1000 UNITS tablet Take 1,000 Units by mouth daily.      Marland Kitchen doxazosin (CARDURA) 4 MG tablet Take 1 tablet (4 mg total) by mouth daily.  30 tablet  3  . hydrochlorothiazide (HYDRODIURIL) 25 MG tablet Take 25 mg by mouth daily.      Marland Kitchen losartan (COZAAR) 50 MG tablet Take 1 tablet (50 mg total) by mouth daily.  30 tablet  11  . rosuvastatin (CRESTOR) 20 MG tablet Take 20 mg by mouth every evening.       No current facility-administered medications on file prior to visit.   Allergies  Allergen Reactions  . Tetanus Toxoids Swelling   History   Social History  . Marital Status: Widowed    Spouse Name: N/A    Number of Children: N/A  . Years of Education: N/A   Occupational History  . Not on file.   Social History Main Topics  . Smoking status: Former Smoker    Quit date: 12/22/1996  . Smokeless tobacco: Never Used  . Alcohol Use: No  . Drug Use: No  . Sexual Activity: Not on file   Other Topics Concern  . Not on file   Social History Narrative  . No narrative on file      Review of Systems    All other systems reviewed and are negative.       Objective:   Physical Exam  Vitals reviewed. Constitutional: She appears well-developed and well-nourished. No distress.  HENT:  Right Ear: External ear and ear canal normal. Tympanic membrane is bulging.  Left Ear: Tympanic membrane, external ear and ear canal normal.  Nose: Mucosal edema and rhinorrhea present. No nose lacerations. Right sinus exhibits no maxillary sinus tenderness and no frontal sinus tenderness. Left sinus exhibits no maxillary sinus tenderness and no frontal sinus tenderness.  Mouth/Throat: Oropharynx is clear and moist. No oropharyngeal exudate.  Neck: Neck supple.  Cardiovascular: Normal rate, regular rhythm and normal heart sounds.   No murmur heard. Pulmonary/Chest: Effort normal and breath sounds normal. No respiratory distress. She has no wheezes. She has no rales.  Lymphadenopathy:    She has no cervical adenopathy.  Skin: She is not diaphoretic.          Assessment & Plan:  1. Allergic rhinosinusitis Begin prednisone taper pack to suppress her sinusitis.  Afterwards, start Flonase 2 sprays each nostril daily to maintain suppression. - fluticasone (FLONASE) 50 MCG/ACT nasal spray; Place 2 sprays into both nostrils daily.  Dispense: 16 g;  Refill: 6 - predniSONE (DELTASONE) 20 MG tablet; 3 tabs poqday 1-2, 2 tabs poqday 3-4, 1 tab poqday 5-6  Dispense: 12 tablet; Refill: 0

## 2014-06-04 ENCOUNTER — Other Ambulatory Visit: Payer: Self-pay | Admitting: Family Medicine

## 2014-06-21 HISTORY — PX: CT SCAN: SHX5351

## 2014-06-27 ENCOUNTER — Encounter: Payer: Self-pay | Admitting: Family Medicine

## 2014-06-27 ENCOUNTER — Ambulatory Visit (INDEPENDENT_AMBULATORY_CARE_PROVIDER_SITE_OTHER): Payer: Medicare Other | Admitting: Family Medicine

## 2014-06-27 VITALS — BP 130/58 | HR 62 | Temp 98.0°F | Resp 14 | Ht 64.0 in | Wt 135.0 lb

## 2014-06-27 DIAGNOSIS — R599 Enlarged lymph nodes, unspecified: Secondary | ICD-10-CM | POA: Diagnosis not present

## 2014-06-27 DIAGNOSIS — L299 Pruritus, unspecified: Secondary | ICD-10-CM

## 2014-06-27 DIAGNOSIS — I6529 Occlusion and stenosis of unspecified carotid artery: Secondary | ICD-10-CM

## 2014-06-27 LAB — CBC WITH DIFFERENTIAL/PLATELET
Basophils Absolute: 0.1 10*3/uL (ref 0.0–0.1)
Basophils Relative: 1 % (ref 0–1)
Eosinophils Absolute: 0.1 10*3/uL (ref 0.0–0.7)
Eosinophils Relative: 1 % (ref 0–5)
HCT: 38.1 % (ref 36.0–46.0)
Hemoglobin: 13.3 g/dL (ref 12.0–15.0)
LYMPHS ABS: 1.7 10*3/uL (ref 0.7–4.0)
LYMPHS PCT: 28 % (ref 12–46)
MCH: 31.7 pg (ref 26.0–34.0)
MCHC: 34.9 g/dL (ref 30.0–36.0)
MCV: 90.7 fL (ref 78.0–100.0)
Monocytes Absolute: 0.6 10*3/uL (ref 0.1–1.0)
Monocytes Relative: 9 % (ref 3–12)
NEUTROS ABS: 3.8 10*3/uL (ref 1.7–7.7)
NEUTROS PCT: 61 % (ref 43–77)
PLATELETS: 246 10*3/uL (ref 150–400)
RBC: 4.2 MIL/uL (ref 3.87–5.11)
RDW: 13.3 % (ref 11.5–15.5)
WBC: 6.2 10*3/uL (ref 4.0–10.5)

## 2014-06-27 LAB — COMPLETE METABOLIC PANEL WITHOUT GFR
ALT: 16 U/L (ref 0–35)
AST: 20 U/L (ref 0–37)
Albumin: 4.7 g/dL (ref 3.5–5.2)
Alkaline Phosphatase: 58 U/L (ref 39–117)
BUN: 16 mg/dL (ref 6–23)
CO2: 25 meq/L (ref 19–32)
Calcium: 9.7 mg/dL (ref 8.4–10.5)
Chloride: 100 meq/L (ref 96–112)
Creat: 0.69 mg/dL (ref 0.50–1.10)
GFR, Est African American: >89 mL/min
GFR, Est Non African American: >89 mL/min
Glucose, Bld: 89 mg/dL (ref 70–99)
Potassium: 3.7 meq/L (ref 3.5–5.3)
Sodium: 136 meq/L (ref 135–145)
Total Bilirubin: 0.6 mg/dL (ref 0.2–1.2)
Total Protein: 6.6 g/dL (ref 6.0–8.3)

## 2014-06-27 NOTE — Progress Notes (Signed)
Subjective:    Patient ID: Stacey Moody, female    DOB: 12-29-1945, 68 y.o.   MRN: [id number]  HPI Patient reports 2 weeks of palpable subcutaneous nodules in the upper right chest just below her right clavicle.  I can appreciate to these nodules today. Is approximately 7 mm. The other is approximately 8 mm.  They have the palpable characteristics of a lymph node or possibly a hard cyst. Patient states that she has been itching in the same area these nodules are located although the itching is mild. She denies any fevers or chills or weight loss. She denies any bruising or night sweats or fevers. There are no other palpable lymph nodes on exam today. There are no palpable cervical lymph nodes. No palpable supraclavicular lymph nodes. There is no hepatosplenomegaly.  There is no visible rash. Past Medical History  Diagnosis Date  . Carotid artery occlusion   . Osteopenia   . Hypertension   . Hyperlipidemia   . GERD (gastroesophageal reflux disease)   . Influenza A 01/01/2014  . Syncope 12/31/2013    Vasovagal or hypovolemia/orthostatic   Current Outpatient Prescriptions on File Prior to Visit  Medication Sig Dispense Refill  . aspirin 325 MG tablet Take 325 mg by mouth daily.      . cholecalciferol (VITAMIN D) 1000 UNITS tablet Take 1,000 Units by mouth daily.      Marland Kitchen doxazosin (CARDURA) 4 MG tablet Take 1 tablet (4 mg total) by mouth daily.  30 tablet  3  . fluticasone (FLONASE) 50 MCG/ACT nasal spray Place 2 sprays into both nostrils daily.  16 g  6  . hydrochlorothiazide (HYDRODIURIL) 25 MG tablet Take 25 mg by mouth daily.      Marland Kitchen losartan (COZAAR) 50 MG tablet TAKE 1 TABLET BY MOUTH EVERY DAY  30 tablet  5  . rosuvastatin (CRESTOR) 20 MG tablet Take 20 mg by mouth every evening.       No current facility-administered medications on file prior to visit.   Allergies  Allergen Reactions  . Tetanus Toxoids Swelling   History   Social History  . Marital Status: Widowed    Spouse  Name: N/A    Number of Children: N/A  . Years of Education: N/A   Occupational History  . Not on file.   Social History Main Topics  . Smoking status: Former Smoker    Quit date: 12/22/1996  . Smokeless tobacco: Never Used  . Alcohol Use: No  . Drug Use: No  . Sexual Activity: Not on file   Other Topics Concern  . Not on file   Social History Narrative  . No narrative on file      Review of Systems  All other systems reviewed and are negative.      Objective:   Physical Exam  Cardiovascular: Normal rate, regular rhythm and normal heart sounds.   Pulmonary/Chest: Effort normal and breath sounds normal. No respiratory distress. She has no wheezes. She has no rales. She exhibits no tenderness.  Abdominal: Soft. Bowel sounds are normal. She exhibits no distension. There is no tenderness. There is no rebound and no guarding.  Lymphadenopathy:       Head (right side): No submental, no submandibular, no tonsillar, no preauricular, no posterior auricular and no occipital adenopathy present.       Head (left side): No submental, no submandibular, no tonsillar, no preauricular and no posterior auricular adenopathy present.    She has no cervical adenopathy.  Right cervical: No superficial cervical, no deep cervical and no posterior cervical adenopathy present.      Left cervical: No superficial cervical, no deep cervical and no posterior cervical adenopathy present.    She has axillary adenopathy.       Right axillary: Pectoral adenopathy present.       Right: No inguinal and no supraclavicular adenopathy present.       Left: No inguinal and no supraclavicular adenopathy present.          Assessment & Plan:  Pruritus - Plan: CBC with Differential, COMPLETE METABOLIC PANEL WITH GFR  Enlargement of lymph nodes - Plan: CT Chest W Contrast  I believe the lymph nodes in her anterior right chest are reacting to whatever is making her itch. I will check CBC and CMP. I will  also order imaging of the chest rule out more significant lymphadenopathy deep within the chest. If the lesions continue to grow/enlarged, consider excisional biopsy.

## 2014-06-29 ENCOUNTER — Encounter: Payer: Self-pay | Admitting: Family Medicine

## 2014-07-03 ENCOUNTER — Encounter (HOSPITAL_COMMUNITY): Payer: Self-pay

## 2014-07-03 ENCOUNTER — Ambulatory Visit (HOSPITAL_COMMUNITY)
Admission: RE | Admit: 2014-07-03 | Discharge: 2014-07-03 | Disposition: A | Discharge status: Home / Self Care | Payer: Medicare Other | Source: Ambulatory Visit | Attending: Family Medicine | Admitting: Family Medicine

## 2014-07-03 DIAGNOSIS — I2510 Atherosclerotic heart disease of native coronary artery without angina pectoris: Secondary | ICD-10-CM | POA: Insufficient documentation

## 2014-07-03 DIAGNOSIS — R918 Other nonspecific abnormal finding of lung field: Secondary | ICD-10-CM | POA: Insufficient documentation

## 2014-07-03 DIAGNOSIS — A154 Tuberculosis of intrathoracic lymph nodes: Secondary | ICD-10-CM | POA: Insufficient documentation

## 2014-07-03 DIAGNOSIS — R599 Enlarged lymph nodes, unspecified: Secondary | ICD-10-CM

## 2014-07-03 DIAGNOSIS — I7090 Unspecified atherosclerosis: Secondary | ICD-10-CM | POA: Diagnosis not present

## 2014-07-03 DIAGNOSIS — J984 Other disorders of lung: Secondary | ICD-10-CM | POA: Diagnosis not present

## 2014-07-03 MED ORDER — IOHEXOL 300 MG/ML  SOLN
80.0000 mL | Freq: Once | INTRAMUSCULAR | Status: AC | PRN
  Administered 2014-07-03: 80 mL via INTRAVENOUS

## 2014-07-04 ENCOUNTER — Other Ambulatory Visit: Payer: Self-pay | Admitting: Vascular Surgery

## 2014-07-04 ENCOUNTER — Ambulatory Visit: Payer: Medicare Other | Admitting: Vascular Surgery

## 2014-07-04 ENCOUNTER — Other Ambulatory Visit (HOSPITAL_COMMUNITY): Payer: Medicare Other

## 2014-07-04 DIAGNOSIS — I6529 Occlusion and stenosis of unspecified carotid artery: Secondary | ICD-10-CM

## 2014-07-05 ENCOUNTER — Encounter: Payer: Self-pay | Admitting: Family Medicine

## 2014-07-17 ENCOUNTER — Encounter: Payer: Self-pay | Admitting: Vascular Surgery

## 2014-07-18 ENCOUNTER — Ambulatory Visit (HOSPITAL_COMMUNITY)
Admission: RE | Admit: 2014-07-18 | Discharge: 2014-07-18 | Disposition: A | Discharge status: Home / Self Care | Payer: Medicare Other | Source: Ambulatory Visit | Attending: Vascular Surgery | Admitting: Vascular Surgery

## 2014-07-18 ENCOUNTER — Encounter: Payer: Self-pay | Admitting: Vascular Surgery

## 2014-07-18 ENCOUNTER — Ambulatory Visit (INDEPENDENT_AMBULATORY_CARE_PROVIDER_SITE_OTHER): Payer: Medicare Other | Admitting: Vascular Surgery

## 2014-07-18 VITALS — BP 132/66 | HR 66 | Resp 16 | Ht 64.0 in | Wt 134.0 lb

## 2014-07-18 DIAGNOSIS — I6529 Occlusion and stenosis of unspecified carotid artery: Secondary | ICD-10-CM

## 2014-07-18 NOTE — Addendum Note (Signed)
Addended by: Mena Goes on: 07/18/2014 03:12 PM   Modules accepted: Orders

## 2014-07-18 NOTE — Progress Notes (Signed)
Patient name: Stacey Moody MRN: [id number] DOB: May 02, 1946 Sex: female   Referred by: Dennard Schaumann  Reason for referral:  Chief Complaint  Patient presents with  . Re-evaluation    1 yr  CAROTID  f/u with vascular lab duplex.    HISTORY OF PRESENT ILLNESS: The patient presents today for followup of her known asymptomatic extracranial cerebrovascular occlusive disease. She remains quite active in excellent health with no neurologic deficits. She specifically denies any history of amaurosis fugax, transient ischemic attack or stroke. She has no cardiac disease  Past Medical History  Diagnosis Date  . Carotid artery occlusion   . Osteopenia   . Hypertension   . Hyperlipidemia   . GERD (gastroesophageal reflux disease)   . Influenza A 01/01/2014  . Syncope 12/31/2013    Vasovagal or hypovolemia/orthostatic    Past Surgical History  Procedure Laterality Date  . Abdominal hysterectomy  1982    Partial hysterectomy  . Small intestine surgery    . Ct scan  July 2015    Chest    History   Social History  . Marital Status: Widowed    Spouse Name: N/A    Number of Children: N/A  . Years of Education: N/A   Occupational History  . Not on file.   Social History Main Topics  . Smoking status: Former Smoker    Quit date: 12/22/1996  . Smokeless tobacco: Never Used  . Alcohol Use: No  . Drug Use: No  . Sexual Activity: Not on file   Other Topics Concern  . Not on file   Social History Narrative  . No narrative on file    Family History  Problem Relation Age of Onset  . Cancer Mother     pancratic  . Heart attack Mother   . Cancer Father     stomach  . Diabetes Father   . Hyperlipidemia Father   . Heart disease Father     NOT  before age 33  . Cancer Sister     lung  . Hyperlipidemia Sister   . Cancer Brother     thyroid, prostate  . Hyperlipidemia Brother   . Heart attack Brother   . Cancer Sister     colon  . Hyperlipidemia Sister   . Cancer Sister       colon  . Hyperlipidemia Sister     Allergies as of 07/18/2014 - Review Complete 07/18/2014  Allergen Reaction Noted  . Tetanus toxoids Swelling 05/07/2011    Current Outpatient Prescriptions on File Prior to Visit  Medication Sig Dispense Refill  . aspirin 325 MG tablet Take 325 mg by mouth daily.      . cholecalciferol (VITAMIN D) 1000 UNITS tablet Take 1,000 Units by mouth daily.      . hydrochlorothiazide (HYDRODIURIL) 25 MG tablet Take 25 mg by mouth daily.      Marland Kitchen losartan (COZAAR) 50 MG tablet TAKE 1 TABLET BY MOUTH EVERY DAY  30 tablet  5  . rosuvastatin (CRESTOR) 20 MG tablet Take 20 mg by mouth every evening.      Marland Kitchen doxazosin (CARDURA) 4 MG tablet Take 1 tablet (4 mg total) by mouth daily.  30 tablet  3   No current facility-administered medications on file prior to visit.       PHYSICAL EXAMINATION:  General: The patient is a well-nourished female, in no acute distress. Vital signs are BP 132/66  Pulse 66  Resp 16  Ht 5'  4" (1.626 m)  Wt 134 lb (60.782 kg)  BMI 22.99 kg/m2  SpO2 96% Pulmonary: There is a good air exchange bilaterally   Musculoskeletal: There are no major deformities.  There is no significant extremity pain. Neurologic: No focal weakness or paresthesias are detected, Skin: There are no ulcer or rashes noted. Psychiatric: The patient has normal affect. Cardiovascular: There is a regular rate and rhythm without significant murmur appreciated. Carotid arteries without bruits bilaterally   [facility] Vascular Lab Studies:  Ordered and Independently Reviewed . This was discussed with the patient. Does show no significant change. She has 40-59% stenosis and left internal carotid artery and less than 40% stenosis in the right internal carotid artery.  Impression and Plan:  Asymptomatic moderate carotid disease. Again discussed symptoms with patient. She will notify us if he should this occur in this severe will present immediately to the emergency  room. We will see her again in one year with repeat carotid duplex    Sherrill Buikema [facility] Office: [phone]

## 2014-08-05 ENCOUNTER — Other Ambulatory Visit: Payer: Self-pay | Admitting: Family Medicine

## 2014-08-07 NOTE — Telephone Encounter (Signed)
Refill appropriate and filled per protocol. 

## 2014-11-29 ENCOUNTER — Other Ambulatory Visit: Payer: Self-pay | Admitting: Family Medicine

## 2014-11-29 DIAGNOSIS — Z1231 Encounter for screening mammogram for malignant neoplasm of breast: Secondary | ICD-10-CM

## 2014-11-30 ENCOUNTER — Ambulatory Visit (HOSPITAL_COMMUNITY)
Admission: RE | Admit: 2014-11-30 | Discharge: 2014-11-30 | Disposition: A | Discharge status: Home / Self Care | Payer: Medicare Other | Source: Ambulatory Visit | Attending: Family Medicine | Admitting: Family Medicine

## 2014-11-30 DIAGNOSIS — Z1231 Encounter for screening mammogram for malignant neoplasm of breast: Secondary | ICD-10-CM | POA: Insufficient documentation

## 2014-12-06 ENCOUNTER — Other Ambulatory Visit: Payer: Self-pay | Admitting: Family Medicine

## 2014-12-06 NOTE — Telephone Encounter (Signed)
Medication refilled per protocol. 

## 2014-12-29 ENCOUNTER — Emergency Department (HOSPITAL_COMMUNITY)
Admission: EM | Admit: 2014-12-29 | Discharge: 2014-12-29 | Disposition: A | Discharge status: Home / Self Care | Payer: Medicare Other | Source: Home / Self Care | Attending: Emergency Medicine | Admitting: Emergency Medicine

## 2014-12-29 ENCOUNTER — Encounter (HOSPITAL_COMMUNITY): Payer: Self-pay | Admitting: *Deleted

## 2014-12-29 DIAGNOSIS — E860 Dehydration: Secondary | ICD-10-CM | POA: Diagnosis present

## 2014-12-29 DIAGNOSIS — A481 Legionnaires' disease: Principal | ICD-10-CM | POA: Diagnosis present

## 2014-12-29 DIAGNOSIS — R197 Diarrhea, unspecified: Principal | ICD-10-CM

## 2014-12-29 DIAGNOSIS — E785 Hyperlipidemia, unspecified: Secondary | ICD-10-CM | POA: Diagnosis not present

## 2014-12-29 DIAGNOSIS — Z87891 Personal history of nicotine dependence: Secondary | ICD-10-CM

## 2014-12-29 DIAGNOSIS — Z8249 Family history of ischemic heart disease and other diseases of the circulatory system: Secondary | ICD-10-CM

## 2014-12-29 DIAGNOSIS — Z7982 Long term (current) use of aspirin: Secondary | ICD-10-CM | POA: Insufficient documentation

## 2014-12-29 DIAGNOSIS — I10 Essential (primary) hypertension: Secondary | ICD-10-CM | POA: Insufficient documentation

## 2014-12-29 DIAGNOSIS — Z7951 Long term (current) use of inhaled steroids: Secondary | ICD-10-CM

## 2014-12-29 DIAGNOSIS — Z8719 Personal history of other diseases of the digestive system: Secondary | ICD-10-CM

## 2014-12-29 DIAGNOSIS — E876 Hypokalemia: Secondary | ICD-10-CM | POA: Diagnosis present

## 2014-12-29 DIAGNOSIS — Z90710 Acquired absence of both cervix and uterus: Secondary | ICD-10-CM

## 2014-12-29 DIAGNOSIS — Z800 Family history of malignant neoplasm of digestive organs: Secondary | ICD-10-CM

## 2014-12-29 DIAGNOSIS — Z8739 Personal history of other diseases of the musculoskeletal system and connective tissue: Secondary | ICD-10-CM | POA: Insufficient documentation

## 2014-12-29 DIAGNOSIS — K529 Noninfective gastroenteritis and colitis, unspecified: Secondary | ICD-10-CM | POA: Diagnosis present

## 2014-12-29 DIAGNOSIS — E871 Hypo-osmolality and hyponatremia: Secondary | ICD-10-CM | POA: Diagnosis present

## 2014-12-29 DIAGNOSIS — R112 Nausea with vomiting, unspecified: Secondary | ICD-10-CM | POA: Insufficient documentation

## 2014-12-29 DIAGNOSIS — R0989 Other specified symptoms and signs involving the circulatory and respiratory systems: Secondary | ICD-10-CM | POA: Diagnosis not present

## 2014-12-29 DIAGNOSIS — R42 Dizziness and giddiness: Secondary | ICD-10-CM | POA: Diagnosis not present

## 2014-12-29 DIAGNOSIS — Z833 Family history of diabetes mellitus: Secondary | ICD-10-CM

## 2014-12-29 DIAGNOSIS — D649 Anemia, unspecified: Secondary | ICD-10-CM | POA: Diagnosis present

## 2014-12-29 DIAGNOSIS — Z8639 Personal history of other endocrine, nutritional and metabolic disease: Secondary | ICD-10-CM

## 2014-12-29 DIAGNOSIS — K219 Gastro-esophageal reflux disease without esophagitis: Secondary | ICD-10-CM | POA: Diagnosis present

## 2014-12-29 DIAGNOSIS — M8580 Other specified disorders of bone density and structure, unspecified site: Secondary | ICD-10-CM | POA: Diagnosis present

## 2014-12-29 DIAGNOSIS — R5383 Other fatigue: Secondary | ICD-10-CM

## 2014-12-29 DIAGNOSIS — Z801 Family history of malignant neoplasm of trachea, bronchus and lung: Secondary | ICD-10-CM

## 2014-12-29 DIAGNOSIS — R509 Fever, unspecified: Secondary | ICD-10-CM | POA: Diagnosis not present

## 2014-12-29 DIAGNOSIS — Z8709 Personal history of other diseases of the respiratory system: Secondary | ICD-10-CM | POA: Insufficient documentation

## 2014-12-29 DIAGNOSIS — J189 Pneumonia, unspecified organism: Secondary | ICD-10-CM | POA: Diagnosis present

## 2014-12-29 DIAGNOSIS — I700 Atherosclerosis of aorta: Secondary | ICD-10-CM | POA: Diagnosis not present

## 2014-12-29 DIAGNOSIS — R1110 Vomiting, unspecified: Secondary | ICD-10-CM

## 2014-12-29 LAB — URINALYSIS, ROUTINE W REFLEX MICROSCOPIC
GLUCOSE, UA: NEGATIVE mg/dL
Ketones, ur: 40 mg/dL — AB
Leukocytes, UA: NEGATIVE
Nitrite: NEGATIVE
Protein, ur: 100 mg/dL — AB
Specific Gravity, Urine: >1.03 — ABNORMAL HIGH (ref 1.005–1.030)
UROBILINOGEN UA: 1 mg/dL (ref 0.0–1.0)
pH: 5.5 (ref 5.0–8.0)

## 2014-12-29 LAB — CBC WITH DIFFERENTIAL/PLATELET
BASOS ABS: 0 10*3/uL (ref 0.0–0.1)
Basophils Relative: 0 % (ref 0–1)
EOS ABS: 0 10*3/uL (ref 0.0–0.7)
EOS PCT: 0 % (ref 0–5)
HCT: 37.4 % (ref 36.0–46.0)
Hemoglobin: 12.7 g/dL (ref 12.0–15.0)
LYMPHS ABS: 0.6 10*3/uL — AB (ref 0.7–4.0)
LYMPHS PCT: 5 % — AB (ref 12–46)
MCH: 32.2 pg (ref 26.0–34.0)
MCHC: 34 g/dL (ref 30.0–36.0)
MCV: 94.7 fL (ref 78.0–100.0)
MONO ABS: 1 10*3/uL (ref 0.1–1.0)
MONOS PCT: 9 % (ref 3–12)
Neutro Abs: 10.2 10*3/uL — ABNORMAL HIGH (ref 1.7–7.7)
Neutrophils Relative %: 86 % — ABNORMAL HIGH (ref 43–77)
Platelets: 176 10*3/uL (ref 150–400)
RBC: 3.95 MIL/uL (ref 3.87–5.11)
RDW: 12.3 % (ref 11.5–15.5)
WBC: 11.9 10*3/uL — ABNORMAL HIGH (ref 4.0–10.5)

## 2014-12-29 LAB — BASIC METABOLIC PANEL
ANION GAP: 9 (ref 5–15)
BUN: 17 mg/dL (ref 6–23)
CALCIUM: 8.5 mg/dL (ref 8.4–10.5)
CHLORIDE: 96 meq/L (ref 96–112)
CO2: 24 mmol/L (ref 19–32)
Creatinine, Ser: 0.81 mg/dL (ref 0.50–1.10)
GFR, EST AFRICAN AMERICAN: 84 mL/min — AB (ref >90)
GFR, EST NON AFRICAN AMERICAN: 72 mL/min — AB (ref >90)
Glucose, Bld: 134 mg/dL — ABNORMAL HIGH (ref 70–99)
Potassium: 3.4 mmol/L — ABNORMAL LOW (ref 3.5–5.1)
SODIUM: 129 mmol/L — AB (ref 135–145)

## 2014-12-29 LAB — URINE MICROSCOPIC-ADD ON

## 2014-12-29 MED ORDER — SODIUM CHLORIDE 0.9 % IV BOLUS (SEPSIS)
500.0000 mL | Freq: Once | INTRAVENOUS | Status: AC
  Administered 2014-12-29: 500 mL via INTRAVENOUS

## 2014-12-29 MED ORDER — ONDANSETRON HCL 4 MG/2ML IJ SOLN
4.0000 mg | Freq: Once | INTRAMUSCULAR | Status: AC
  Administered 2014-12-29: 4 mg via INTRAVENOUS

## 2014-12-29 MED ORDER — ONDANSETRON HCL 4 MG/2ML IJ SOLN
4.0000 mg | Freq: Once | INTRAMUSCULAR | Status: DC
  Filled 2014-12-29: qty 2

## 2014-12-29 MED ORDER — ONDANSETRON HCL 8 MG PO TABS: 8.0000 mg | ORAL_TABLET | Freq: Three times a day (TID) | ORAL | Status: DC | PRN

## 2014-12-29 MED ORDER — SODIUM CHLORIDE 0.9 % IV BOLUS (SEPSIS)
1000.0000 mL | Freq: Once | INTRAVENOUS | Status: AC
  Administered 2014-12-29: 1000 mL via INTRAVENOUS

## 2014-12-29 NOTE — ED Provider Notes (Signed)
CSN: [id number]     Arrival date & time 12/29/14  6283 History  This chart was scribed for Sharyon Cable, MD by Dellis Filbert, ED Scribe. The patient was seen in [facility] and the patient's care was started at 9:46 AM.  Chief Complaint  Patient presents with  . Dizziness   Patient is a 69 y.o. female presenting with dizziness and diarrhea. The history is provided by the patient. No language interpreter was used.  Dizziness Severity:  Mild Onset quality:  Sudden Duration:  1 day Timing:  Constant Chronicity:  New Relieved by:  Nothing Worsened by:  Nothing tried Ineffective treatments:  None tried Associated symptoms: diarrhea, nausea and vomiting   Associated symptoms: no chest pain   Diarrhea Number of episodes:  7 Duration:  1 day Relieved by:  Nothing Worsened by:  Nothing tried Associated symptoms: vomiting     HPI Comments: Stacey Moody is a 69 y.o. female who presents to the Emergency Department complaining of fatigue and weakness, onset 2 days ago. Then last night pt has 7 episodes of diarrhea and vomiting. She has dizziness and nausea as associated symptoms. Pt has not had sick contacts, she has not traveled out of the country and has not taken antibiotics. She denies fever, CP, abdominal pain and cough.   Past Medical History  Diagnosis Date  . Carotid artery occlusion   . Osteopenia   . Hypertension   . Hyperlipidemia   . GERD (gastroesophageal reflux disease)   . Influenza A 01/01/2014  . Syncope 12/31/2013    Vasovagal or hypovolemia/orthostatic   Past Surgical History  Procedure Laterality Date  . Abdominal hysterectomy  1982    Partial hysterectomy  . Small intestine surgery    . Ct scan  July 2015    Chest   Family History  Problem Relation Age of Onset  . Cancer Mother     pancratic  . Heart attack Mother   . Cancer Father     stomach  . Diabetes Father   . Hyperlipidemia Father   . Heart disease Father     NOT  before age 31  . Cancer  Sister     lung  . Hyperlipidemia Sister   . Cancer Brother     thyroid, prostate  . Hyperlipidemia Brother   . Heart attack Brother   . Cancer Sister     colon  . Hyperlipidemia Sister   . Cancer Sister     colon  . Hyperlipidemia Sister    History  Substance Use Topics  . Smoking status: Former Smoker    Quit date: 12/22/1996  . Smokeless tobacco: Never Used  . Alcohol Use: No   OB History    No data available     Review of Systems  Constitutional: Positive for fatigue.  Respiratory: Negative for cough.   Cardiovascular: Negative for chest pain.  Gastrointestinal: Positive for nausea, vomiting and diarrhea.  Neurological: Positive for dizziness and weakness.  All other systems reviewed and are negative.     Allergies  Tetanus toxoids  Home Medications   Prior to Admission medications   Medication Sig Start Date End Date Taking? Authorizing Provider  aspirin 325 MG tablet Take 325 mg by mouth daily.    Historical Provider, MD  cholecalciferol (VITAMIN D) 1000 UNITS tablet Take 1,000 Units by mouth daily.    Historical Provider, MD  CRESTOR 20 MG tablet TAKE 1 TABLET BY MOUTH EVERY DAY 08/07/14   Cletus Gash  Avel Peace, MD  doxazosin (CARDURA) 4 MG tablet Take 1 tablet (4 mg total) by mouth daily. 02/14/14   Susy Frizzle, MD  fluticasone (FLONASE) 50 MCG/ACT nasal spray Place 2 sprays into both nostrils as needed. 03/20/14   Susy Frizzle, MD  hydrochlorothiazide (HYDRODIURIL) 25 MG tablet TAKE 1 TABLET BY MOUTH EVERY DAY 08/07/14   Susy Frizzle, MD  losartan (COZAAR) 50 MG tablet TAKE 1 TABLET BY MOUTH EVERY DAY 12/06/14   Susy Frizzle, MD   BP 155/58 mmHg  Pulse 92  Temp(Src) 100 F (37.8 C) (Oral)  Resp 17  Ht 5\' 3"  (1.6 m)  Wt 130 lb (58.968 kg)  BMI 23.03 kg/m2  SpO2 95% Physical Exam CONSTITUTIONAL: Well developed/well nourished HEAD: Normocephalic/atraumatic EYES: EOMI/PERRL, no scleral icterus ENMT: Mucous membranes dry NECK: supple no  meningeal signs SPINE/BACK:entire spine nontender CV: S1/S2 noted, no murmurs/rubs/gallops noted LUNGS: Lungs are clear to auscultation bilaterally, no apparent distress ABDOMEN: soft, nontender, no rebound or guarding, bowel sounds noted throughout abdomen GU:no cva tenderness NEURO: Pt is awake/alert/appropriate, moves all extremitiesx4.  No facial droop.   EXTREMITIES: pulses normal/equal, full ROM SKIN: warm, color normal PSYCH: no abnormalities of mood noted, alert and oriented to situation  ED Course  Procedures  DIAGNOSTIC STUDIES: Oxygen Saturation is 95% on room air, normal by my interpretation.    COORDINATION OF CARE: 9:50 AM Discussed treatment plan with pt at bedside and pt agreed to plan.   Pt monitored for several hours She felt improved after IV fluids She was ambulatory without issue.  No vomiting/diarrhea here. She denies abd pain With vomiting/diarrhea, suspect viral illness I feel she is safe/apprpriate for d/c home   Labs Review Labs Reviewed  CBC WITH DIFFERENTIAL - Abnormal; Notable for the following:    WBC 11.9 (*)    Neutrophils Relative % 86 (*)    Neutro Abs 10.2 (*)    Lymphocytes Relative 5 (*)    Lymphs Abs 0.6 (*)    All other components within normal limits  BASIC METABOLIC PANEL - Abnormal; Notable for the following:    Sodium 129 (*)    Potassium 3.4 (*)    Glucose, Bld 134 (*)    GFR calc non Af Amer 72 (*)    GFR calc Af Amer 84 (*)    All other components within normal limits  URINALYSIS, ROUTINE W REFLEX MICROSCOPIC - Abnormal; Notable for the following:    Color, Urine AMBER (*)    APPearance HAZY (*)    Specific Gravity, Urine >1.030 (*)    Hgb urine dipstick MODERATE (*)    Bilirubin Urine SMALL (*)    Ketones, ur 40 (*)    Protein, ur 100 (*)    All other components within normal limits  URINE MICROSCOPIC-ADD ON - Abnormal; Notable for the following:    Bacteria, UA FEW (*)    Casts GRANULAR CAST (*)    All other  components within normal limits    EKG Interpretation   Date/Time:  Friday December 29 2014 10:01:42 EST Ventricular Rate:  91 PR Interval:  137 QRS Duration: 77 QT Interval:  342 QTC Calculation: 421 R Axis:   63 Text Interpretation:  Sinus rhythm Minimal ST depression, anterolateral  leads No significant change since last tracing Confirmed by Christy Gentles  MD,  Oley Lahaie ([id number]) on 12/29/2014 10:05:11 AM     Medications  sodium chloride 0.9 % bolus 1,000 mL (0 mLs Intravenous Stopped 12/29/14 1153)  sodium chloride 0.9 % bolus 500 mL (0 mLs Intravenous Stopped 12/29/14 1455)  ondansetron (ZOFRAN) injection 4 mg (4 mg Intravenous Given 12/29/14 1238)    MDM   Final diagnoses:  Vomiting and diarrhea  Dehydration  Non-intractable vomiting with nausea, vomiting of unspecified type      Nursing notes including past medical history and social history reviewed and considered in documentation Labs/vital reviewed myself and considered during evaluation   I personally performed the services described in this documentation, which was scribed in my presence. The recorded information has been reviewed and is accurate.      Sharyon Cable, MD 12/29/14 [phone]

## 2014-12-29 NOTE — ED Notes (Signed)
Pt states that she has had diarrhea and emesis that started last night, emesis has ceased, nausea is constant, diarrhea has decreased but is still present, pt co dizziness that started Tues.

## 2014-12-29 NOTE — ED Notes (Signed)
Ambulated patient, patient asymptomatic during and after walk. VSS after walk: HR 88 SPO2 94 RA, BP 141/57 RR 20

## 2014-12-30 ENCOUNTER — Emergency Department (HOSPITAL_COMMUNITY): Payer: Medicare Other

## 2014-12-30 ENCOUNTER — Observation Stay (HOSPITAL_COMMUNITY): Payer: Medicare Other

## 2014-12-30 ENCOUNTER — Inpatient Hospital Stay (HOSPITAL_COMMUNITY)
Admission: EM | Admit: 2014-12-30 | Discharge: 2015-01-02 | DRG: 178 | Disposition: A | Discharge status: Home / Self Care | Payer: Medicare Other | Attending: Internal Medicine | Admitting: Internal Medicine

## 2014-12-30 ENCOUNTER — Encounter (HOSPITAL_COMMUNITY): Payer: Self-pay | Admitting: Emergency Medicine

## 2014-12-30 DIAGNOSIS — E876 Hypokalemia: Secondary | ICD-10-CM | POA: Diagnosis not present

## 2014-12-30 DIAGNOSIS — R509 Fever, unspecified: Secondary | ICD-10-CM | POA: Diagnosis present

## 2014-12-30 DIAGNOSIS — R109 Unspecified abdominal pain: Secondary | ICD-10-CM | POA: Insufficient documentation

## 2014-12-30 DIAGNOSIS — R1110 Vomiting, unspecified: Secondary | ICD-10-CM | POA: Diagnosis not present

## 2014-12-30 DIAGNOSIS — E860 Dehydration: Secondary | ICD-10-CM | POA: Diagnosis present

## 2014-12-30 DIAGNOSIS — I700 Atherosclerosis of aorta: Secondary | ICD-10-CM | POA: Diagnosis not present

## 2014-12-30 DIAGNOSIS — R112 Nausea with vomiting, unspecified: Secondary | ICD-10-CM | POA: Diagnosis present

## 2014-12-30 DIAGNOSIS — K529 Noninfective gastroenteritis and colitis, unspecified: Secondary | ICD-10-CM | POA: Diagnosis present

## 2014-12-30 DIAGNOSIS — R0989 Other specified symptoms and signs involving the circulatory and respiratory systems: Secondary | ICD-10-CM | POA: Diagnosis not present

## 2014-12-30 DIAGNOSIS — E871 Hypo-osmolality and hyponatremia: Secondary | ICD-10-CM | POA: Diagnosis not present

## 2014-12-30 DIAGNOSIS — R740 Nonspecific elevation of levels of transaminase and lactic acid dehydrogenase [LDH]: Secondary | ICD-10-CM

## 2014-12-30 DIAGNOSIS — R197 Diarrhea, unspecified: Secondary | ICD-10-CM

## 2014-12-30 DIAGNOSIS — R7401 Elevation of levels of liver transaminase levels: Secondary | ICD-10-CM

## 2014-12-30 DIAGNOSIS — J189 Pneumonia, unspecified organism: Secondary | ICD-10-CM

## 2014-12-30 DIAGNOSIS — A481 Legionnaires' disease: Secondary | ICD-10-CM | POA: Insufficient documentation

## 2014-12-30 LAB — COMPREHENSIVE METABOLIC PANEL
ALK PHOS: 49 U/L (ref 39–117)
ALT: 70 U/L — ABNORMAL HIGH (ref 0–35)
ANION GAP: 9 (ref 5–15)
AST: 107 U/L — ABNORMAL HIGH (ref 0–37)
Albumin: 3.1 g/dL — ABNORMAL LOW (ref 3.5–5.2)
BILIRUBIN TOTAL: 0.7 mg/dL (ref 0.3–1.2)
BUN: 15 mg/dL (ref 6–23)
CO2: 20 mmol/L (ref 19–32)
Calcium: 7.7 mg/dL — ABNORMAL LOW (ref 8.4–10.5)
Chloride: 102 mEq/L (ref 96–112)
Creatinine, Ser: 0.83 mg/dL (ref 0.50–1.10)
GFR calc Af Amer: 82 mL/min — ABNORMAL LOW (ref >90)
GFR calc non Af Amer: 70 mL/min — ABNORMAL LOW (ref >90)
Glucose, Bld: 115 mg/dL — ABNORMAL HIGH (ref 70–99)
Potassium: 3.1 mmol/L — ABNORMAL LOW (ref 3.5–5.1)
SODIUM: 131 mmol/L — AB (ref 135–145)
TOTAL PROTEIN: 6 g/dL (ref 6.0–8.3)

## 2014-12-30 LAB — TROPONIN I: Troponin I: 0.03 ng/mL (ref <0.031)

## 2014-12-30 LAB — CBC WITH DIFFERENTIAL/PLATELET
BASOS ABS: 0 10*3/uL (ref 0.0–0.1)
BASOS PCT: 0 % (ref 0–1)
EOS ABS: 0 10*3/uL (ref 0.0–0.7)
EOS PCT: 0 % (ref 0–5)
HEMATOCRIT: 32.4 % — AB (ref 36.0–46.0)
Hemoglobin: 11.1 g/dL — ABNORMAL LOW (ref 12.0–15.0)
LYMPHS ABS: 0.5 10*3/uL — AB (ref 0.7–4.0)
Lymphocytes Relative: 5 % — ABNORMAL LOW (ref 12–46)
MCH: 32.4 pg (ref 26.0–34.0)
MCHC: 34.3 g/dL (ref 30.0–36.0)
MCV: 94.5 fL (ref 78.0–100.0)
MONOS PCT: 8 % (ref 3–12)
Monocytes Absolute: 0.8 10*3/uL (ref 0.1–1.0)
NEUTROS PCT: 87 % — AB (ref 43–77)
Neutro Abs: 8.8 10*3/uL — ABNORMAL HIGH (ref 1.7–7.7)
PLATELETS: 157 10*3/uL (ref 150–400)
RBC: 3.43 MIL/uL — AB (ref 3.87–5.11)
RDW: 12.4 % (ref 11.5–15.5)
WBC: 10.1 10*3/uL (ref 4.0–10.5)

## 2014-12-30 MED ORDER — IOHEXOL 300 MG/ML  SOLN
50.0000 mL | Freq: Once | INTRAMUSCULAR | Status: AC | PRN
  Administered 2014-12-30: 50 mL via ORAL

## 2014-12-30 MED ORDER — ONDANSETRON HCL 4 MG PO TABS: 4.0000 mg | ORAL_TABLET | Freq: Four times a day (QID) | ORAL | Status: DC | PRN

## 2014-12-30 MED ORDER — POTASSIUM CHLORIDE 10 MEQ/100ML IV SOLN
10.0000 meq | Freq: Once | INTRAVENOUS | Status: AC
  Administered 2014-12-30: 10 meq via INTRAVENOUS
  Filled 2014-12-30: qty 100

## 2014-12-30 MED ORDER — ONDANSETRON HCL 4 MG/2ML IJ SOLN
4.0000 mg | Freq: Once | INTRAMUSCULAR | Status: AC
  Administered 2014-12-30: 4 mg via INTRAVENOUS
  Filled 2014-12-30: qty 2

## 2014-12-30 MED ORDER — ACETAMINOPHEN 325 MG PO TABS
650.0000 mg | ORAL_TABLET | Freq: Once | ORAL | Status: AC
  Administered 2014-12-30: 650 mg via ORAL
  Filled 2014-12-30: qty 2

## 2014-12-30 MED ORDER — INFLUENZA VAC SPLIT QUAD 0.5 ML IM SUSY
0.5000 mL | PREFILLED_SYRINGE | INTRAMUSCULAR | Status: AC
  Administered 2014-12-31: 0.5 mL via INTRAMUSCULAR
  Filled 2014-12-30: qty 0.5

## 2014-12-30 MED ORDER — SODIUM CHLORIDE 0.9 % IV BOLUS (SEPSIS)
1000.0000 mL | Freq: Once | INTRAVENOUS | Status: AC
  Administered 2014-12-30: 1000 mL via INTRAVENOUS

## 2014-12-30 MED ORDER — LEVOFLOXACIN IN D5W 750 MG/150ML IV SOLN
750.0000 mg | Freq: Every day | INTRAVENOUS | Status: DC
  Administered 2014-12-30 – 2015-01-01 (×3): 750 mg via INTRAVENOUS
  Filled 2014-12-30 (×3): qty 150

## 2014-12-30 MED ORDER — POTASSIUM CHLORIDE IN NACL 20-0.9 MEQ/L-% IV SOLN
INTRAVENOUS | Status: DC
  Administered 2014-12-30: 23:00:00 via INTRAVENOUS

## 2014-12-30 MED ORDER — ONDANSETRON HCL 4 MG/2ML IJ SOLN
4.0000 mg | Freq: Four times a day (QID) | INTRAMUSCULAR | Status: DC | PRN
  Administered 2014-12-31 (×2): 4 mg via INTRAVENOUS
  Filled 2014-12-30 (×2): qty 2

## 2014-12-30 MED ORDER — IOHEXOL 300 MG/ML  SOLN
100.0000 mL | Freq: Once | INTRAMUSCULAR | Status: AC | PRN
  Administered 2014-12-30: 100 mL via INTRAVENOUS

## 2014-12-30 NOTE — ED Notes (Signed)
Dr. Roderic Palau notified of temp 103.3 .  Orders received.

## 2014-12-30 NOTE — ED Notes (Signed)
PT family stated pt was seen in ED yesterday and told to come back if no subsided on vomiting. PT c/o fever, dizziness, nausea, vomiting and diarrhea. PT reports vomiting x4 today and unable to stomach nausea medication subscribed.

## 2014-12-30 NOTE — H&P (Signed)
PCP:   Odette Fraction, MD   Chief Complaint:  N/v/d x 3 days  HPI: 69 yo healthy female comes in with persistent n/v/d for the last 3 days.  She came to ED yesterday, given some ivf and zofran to go home with and advised if symptoms persisted to come back.  She started spiking fever since then.  Minimal cough.  No sob.  No abd pain no cp.  No swelling anywhere no dysuria.  No nasal congestion.  No sick contacts.  No blood in vomit or diarrhea.  No recent abx.  We were asked to obs pt for dehydration from viral gastroenteritis, ct scan was requested due to the longetivity of her symptoms with new fever now.    Review of Systems:  Positive and negative as per HPI otherwise all other systems are negative  Past Medical History: Past Medical History  Diagnosis Date  . Carotid artery occlusion   . Osteopenia   . Hypertension   . Hyperlipidemia   . GERD (gastroesophageal reflux disease)   . Influenza A 01/01/2014  . Syncope 12/31/2013    Vasovagal or hypovolemia/orthostatic   Past Surgical History  Procedure Laterality Date  . Abdominal hysterectomy  1982    Partial hysterectomy  . Small intestine surgery    . Ct scan  July 2015    Chest    Medications: Prior to Admission medications   Medication Sig Start Date End Date Taking? Authorizing Provider  aspirin 325 MG tablet Take 325 mg by mouth at bedtime.    Yes Historical Provider, MD  cholecalciferol (VITAMIN D) 1000 UNITS tablet Take 1,000 Units by mouth daily.   Yes Historical Provider, MD  CRESTOR 20 MG tablet TAKE 1 TABLET BY MOUTH EVERY DAY Patient taking differently: takes 1 tablet at bedtime 08/07/14  Yes Susy Frizzle, MD  fluticasone [facility]) 50 MCG/ACT nasal spray Place 2 sprays into both nostrils as needed. 03/20/14  Yes Susy Frizzle, MD  hydrochlorothiazide (HYDRODIURIL) 25 MG tablet TAKE 1 TABLET BY MOUTH EVERY DAY 08/07/14  Yes Susy Frizzle, MD  losartan (COZAAR) 50 MG tablet TAKE 1 TABLET BY MOUTH EVERY  DAY 12/06/14  Yes Susy Frizzle, MD  ondansetron (ZOFRAN) 8 MG tablet Take 1 tablet (8 mg total) by mouth every 8 (eight) hours as needed. 12/29/14  Yes Sharyon Cable, MD    Allergies:   Allergies  Allergen Reactions  . Tetanus Toxoids Swelling    Social History:  reports that she quit smoking about 18 years ago. She has never used smokeless tobacco. She reports that she does not drink alcohol or use illicit drugs.  Family History: Family History  Problem Relation Age of Onset  . Cancer Mother     pancratic  . Heart attack Mother   . Cancer Father     stomach  . Diabetes Father   . Hyperlipidemia Father   . Heart disease Father     NOT  before age 56  . Cancer Sister     lung  . Hyperlipidemia Sister   . Cancer Brother     thyroid, prostate  . Hyperlipidemia Brother   . Heart attack Brother   . Cancer Sister     colon  . Hyperlipidemia Sister   . Cancer Sister     colon  . Hyperlipidemia Sister     Physical Exam: Filed Vitals:   12/30/14 1718 12/30/14 1741 12/30/14 1800 12/30/14 1812  BP: 123/38 144/50 142/49  Pulse: 92 92    Temp: 102.8 F (39.3 C)   103.3 F (39.6 C)  TempSrc: Core (Comment)   Oral  Resp: 18     Height: 5\' 3"  (1.6 m)     Weight: 58.968 kg (130 lb)     SpO2: 97% 93%     General appearance: alert, cooperative and no distress Head: Normocephalic, without obvious abnormality, atraumatic Eyes: negative Nose: Nares normal. Septum midline. Mucosa normal. No drainage or sinus tenderness. Neck: no JVD and supple, symmetrical, trachea midline Lungs: clear to auscultation bilaterally Heart: regular rate and rhythm, S1, S2 normal, no murmur, click, rub or gallop Abdomen: soft, non-tender; bowel sounds normal; no masses,  no organomegaly Extremities: extremities normal, atraumatic, no cyanosis or edema Pulses: 2+ and symmetric Skin: Skin color, texture, turgor normal. No rashes or lesions Neurologic: Grossly normal    Labs on  Admission:   Recent Labs  12/29/14 0953 12/30/14 1745  NA 129* 131*  K 3.4* 3.1*  CL 96 102  CO2 24 20  GLUCOSE 134* 115*  BUN 17 15  CREATININE 0.81 0.83  CALCIUM 8.5 7.7*    Recent Labs  12/30/14 1745  AST 107*  ALT 70*  ALKPHOS 49  BILITOT 0.7  PROT 6.0  ALBUMIN 3.1*    Recent Labs  12/29/14 0953 12/30/14 1745  WBC 11.9* 10.1  NEUTROABS 10.2* 8.8*  HGB 12.7 11.1*  HCT 37.4 32.4*  MCV 94.7 94.5  PLT 176 157   Radiological Exams on Admission: Dg Abd Acute W/chest  12/30/2014   CLINICAL DATA:  69 year old female with 3 day history of vomiting and fever to 103 degrees.  EXAM: ACUTE ABDOMEN SERIES (ABDOMEN 2 VIEW & CHEST 1 VIEW)  COMPARISON:  Chest x-ray 12/31/2013.  FINDINGS: No acute consolidative airspace disease. No pleural effusions. Cephalization of the pulmonary vasculature, without frank pulmonary edema. Heart size appears borderline enlarged. Atherosclerosis in the thoracic aorta.  Supine and upright views of the abdomen demonstrate gas and stool scattered throughout the colon extending to the level of the distal rectum. Several nondilated loops of gas-filled small bowel are noted, which are highly nonspecific. No pathologic dilatation of small bowel. No pneumoperitoneum.  IMPRESSION: 1. Nonobstructive bowel gas pattern. 2. No pneumoperitoneum. 3. Borderline cardiomegaly with pulmonary venous congestion, but no frank pulmonary edema. 4. Atherosclerosis.   Electronically Signed   By: Vinnie Langton M.D.   On: 12/30/2014 19:05    Assessment/Plan  69 yo female with 3 days of n/v/d, fever over 101 since yesterday with mild dehydration  Principal Problem:   Dehydration-  Ivf.  Replete lytes.  Suspect she may have pna per ct scan results.  Start on levaquin.  probable pna-  levaquin as above  Active Problems:  Stable unless o/w noted   Hypertension     Hyperlipidemia   Diarrhea   Nausea & vomiting   Hypokalemia--ivf replete   Hyponatremia  ivf ns    Fever  obs on med.  Full code.  Clarify home meds.  Keidrick Murty A 12/30/2014, 7:56 PM

## 2014-12-30 NOTE — Progress Notes (Signed)
ANTIBIOTIC CONSULT NOTE - INITIAL  Pharmacy Consult for levofloxacin Indication:  No indication on request - probably R/O urosepsis   Allergies  Allergen Reactions  . Tetanus Toxoids Swelling    Patient Measurements: Height: 5\' 3"  (160 cm) Weight: 139 lb 12.4 oz (63.4 kg) IBW/kg (Calculated) : 52.4 Adjusted Body Weight: 56 kg  Vital Signs: Temp: 99.1 F (37.3 C) (01/09 2225) Temp Source: Oral (01/09 2225) BP: 126/50 mmHg (01/09 2225) Pulse Rate: 86 (01/09 2225) Intake/Output from previous day:   Intake/Output from this shift:    Labs:  Recent Labs  12/29/14 0953 12/30/14 1745  WBC 11.9* 10.1  HGB 12.7 11.1*  PLT 176 157  CREATININE 0.81 0.83   Estimated Creatinine Clearance: 57.4 mL/min (by C-G formula based on Cr of 0.83). No results for input(s): VANCOTROUGH, VANCOPEAK, VANCORANDOM, GENTTROUGH, GENTPEAK, GENTRANDOM, TOBRATROUGH, TOBRAPEAK, TOBRARND, AMIKACINPEAK, AMIKACINTROU, AMIKACIN in the last 72 hours.   Microbiology: No results found for this or any previous visit (from the past 720 hour(s)).  Medical History: Past Medical History  Diagnosis Date  . Carotid artery occlusion   . Osteopenia   . Hypertension   . Hyperlipidemia   . GERD (gastroesophageal reflux disease)   . Influenza A 01/01/2014  . Syncope 12/31/2013    Vasovagal or hypovolemia/orthostatic    Medications:  Scheduled:  . [START ON 12/31/2014] Influenza vac split quadrivalent PF  0.5 mL Intramuscular Tomorrow-1000  . levofloxacin (LEVAQUIN) IV  750 mg Intravenous QHS   Infusions:  . 0.9 % NaCl with KCl 20 mEq / L 75 mL/hr at 12/30/14 2230   PRN: ondansetron **OR** ondansetron (ZOFRAN) IV   Assessment: 69 yr female with 3 day hx of severe vomiting / diarrhea / fever - admitted for hydration and W/U .  No notes in chart yet with indication for levofloxacin, assuming R/O UTI, possible sepsis, possible bacterial gut infection  Goal of Therapy:  Since patient has CrCl >66ml/min, will  utilize more aggressive levofloxacin standard regimen ( colitis regimen is only 500mg  daily)  Plan:  1.  Levofloxacin 750mg  q24h 2.  Monitor for indices of infection and renal function  Noel Henandez, Milta Deiters E 12/30/2014,10:35 PM

## 2014-12-30 NOTE — ED Provider Notes (Signed)
CSN: [id number]     Arrival date & time 12/30/14  1651 History   First MD Initiated Contact with Patient 12/30/14 1723     Chief Complaint  Patient presents with  . Emesis  . Diarrhea  . Fever     (Consider location/radiation/quality/duration/timing/severity/associated sxs/prior Treatment) Patient is a 69 y.o. female presenting with vomiting, diarrhea, and fever. The history is provided by the patient (the pt has had vomiting and diarrhea for 3 days.  she was treated yesterday in the er with zofran.  no help).  Emesis Severity:  Severe Timing:  Constant Quality:  Undigested food Able to tolerate:  Liquids Progression:  Worsening Chronicity:  Recurrent Recent urination:  Increased Associated symptoms: diarrhea   Associated symptoms: no abdominal pain and no headaches   Diarrhea Associated symptoms: fever and vomiting   Associated symptoms: no abdominal pain and no headaches   Fever Associated symptoms: diarrhea and vomiting   Associated symptoms: no chest pain, no congestion, no cough, no headaches and no rash     Past Medical History  Diagnosis Date  . Carotid artery occlusion   . Osteopenia   . Hypertension   . Hyperlipidemia   . GERD (gastroesophageal reflux disease)   . Influenza A 01/01/2014  . Syncope 12/31/2013    Vasovagal or hypovolemia/orthostatic   Past Surgical History  Procedure Laterality Date  . Abdominal hysterectomy  1982    Partial hysterectomy  . Small intestine surgery    . Ct scan  July 2015    Chest   Family History  Problem Relation Age of Onset  . Cancer Mother     pancratic  . Heart attack Mother   . Cancer Father     stomach  . Diabetes Father   . Hyperlipidemia Father   . Heart disease Father     NOT  before age 32  . Cancer Sister     lung  . Hyperlipidemia Sister   . Cancer Brother     thyroid, prostate  . Hyperlipidemia Brother   . Heart attack Brother   . Cancer Sister     colon  . Hyperlipidemia Sister   . Cancer  Sister     colon  . Hyperlipidemia Sister    History  Substance Use Topics  . Smoking status: Former Smoker    Quit date: 12/22/1996  . Smokeless tobacco: Never Used  . Alcohol Use: No   OB History    No data available     Review of Systems  Constitutional: Positive for fever. Negative for appetite change and fatigue.  HENT: Negative for congestion, ear discharge and sinus pressure.   Eyes: Negative for discharge.  Respiratory: Negative for cough.   Cardiovascular: Negative for chest pain.  Gastrointestinal: Positive for vomiting and diarrhea. Negative for abdominal pain.  Genitourinary: Negative for frequency and hematuria.  Musculoskeletal: Negative for back pain.  Skin: Negative for rash.  Neurological: Negative for seizures and headaches.  Psychiatric/Behavioral: Negative for hallucinations.      Allergies  Tetanus toxoids  Home Medications   Prior to Admission medications   Medication Sig Start Date End Date Taking? Authorizing Provider  aspirin 325 MG tablet Take 325 mg by mouth at bedtime.    Yes Historical Provider, MD  cholecalciferol (VITAMIN D) 1000 UNITS tablet Take 1,000 Units by mouth daily.   Yes Historical Provider, MD  CRESTOR 20 MG tablet TAKE 1 TABLET BY MOUTH EVERY DAY Patient taking differently: takes 1 tablet at bedtime 08/07/14  Yes Susy Frizzle, MD  fluticasone [facility]) 50 MCG/ACT nasal spray Place 2 sprays into both nostrils as needed. 03/20/14  Yes Susy Frizzle, MD  hydrochlorothiazide (HYDRODIURIL) 25 MG tablet TAKE 1 TABLET BY MOUTH EVERY DAY 08/07/14  Yes Susy Frizzle, MD  losartan (COZAAR) 50 MG tablet TAKE 1 TABLET BY MOUTH EVERY DAY 12/06/14  Yes Susy Frizzle, MD  ondansetron (ZOFRAN) 8 MG tablet Take 1 tablet (8 mg total) by mouth every 8 (eight) hours as needed. 12/29/14  Yes Sharyon Cable, MD   BP 142/49 mmHg  Pulse 92  Temp(Src) 103.3 F (39.6 C) (Oral)  Resp 18  Ht 5\' 3"  (1.6 m)  Wt 130 lb (58.968 kg)  BMI  23.03 kg/m2  SpO2 93% Physical Exam  Constitutional: She is oriented to person, place, and time. She appears well-developed.  HENT:  Head: Normocephalic.  Eyes: Conjunctivae and EOM are normal. No scleral icterus.  Neck: Neck supple. No thyromegaly present.  Cardiovascular: Normal rate and regular rhythm.  Exam reveals no gallop and no friction rub.   No murmur heard. Pulmonary/Chest: No stridor. She has no wheezes. She has no rales. She exhibits no tenderness.  Abdominal: She exhibits no distension. There is no tenderness. There is no rebound.  Musculoskeletal: Normal range of motion. She exhibits no edema.  Lymphadenopathy:    She has no cervical adenopathy.  Neurological: She is oriented to person, place, and time. She exhibits normal muscle tone. Coordination normal.  Skin: No rash noted. No erythema.  Psychiatric: She has a normal mood and affect. Her behavior is normal.    ED Course  Procedures (including critical care time) Labs Review Labs Reviewed  CBC WITH DIFFERENTIAL - Abnormal; Notable for the following:    RBC 3.43 (*)    Hemoglobin 11.1 (*)    HCT 32.4 (*)    Neutrophils Relative % 87 (*)    Neutro Abs 8.8 (*)    Lymphocytes Relative 5 (*)    Lymphs Abs 0.5 (*)    All other components within normal limits  COMPREHENSIVE METABOLIC PANEL - Abnormal; Notable for the following:    Sodium 131 (*)    Potassium 3.1 (*)    Glucose, Bld 115 (*)    Calcium 7.7 (*)    Albumin 3.1 (*)    AST 107 (*)    ALT 70 (*)    GFR calc non Af Amer 70 (*)    GFR calc Af Amer 82 (*)    All other components within normal limits    Imaging Review Dg Abd Acute W/chest  12/30/2014   CLINICAL DATA:  69 year old female with 3 day history of vomiting and fever to 103 degrees.  EXAM: ACUTE ABDOMEN SERIES (ABDOMEN 2 VIEW & CHEST 1 VIEW)  COMPARISON:  Chest x-ray 12/31/2013.  FINDINGS: No acute consolidative airspace disease. No pleural effusions. Cephalization of the pulmonary  vasculature, without frank pulmonary edema. Heart size appears borderline enlarged. Atherosclerosis in the thoracic aorta.  Supine and upright views of the abdomen demonstrate gas and stool scattered throughout the colon extending to the level of the distal rectum. Several nondilated loops of gas-filled small bowel are noted, which are highly nonspecific. No pathologic dilatation of small bowel. No pneumoperitoneum.  IMPRESSION: 1. Nonobstructive bowel gas pattern. 2. No pneumoperitoneum. 3. Borderline cardiomegaly with pulmonary venous congestion, but no frank pulmonary edema. 4. Atherosclerosis.   Electronically Signed   By: Vinnie Langton M.D.   On: 12/30/2014 19:05  EKG Interpretation None      MDM   Final diagnoses:  Vomiting  Diarrhea       Maudry Diego, MD 12/30/14 2000

## 2014-12-31 ENCOUNTER — Observation Stay (HOSPITAL_COMMUNITY): Payer: Medicare Other

## 2014-12-31 DIAGNOSIS — Z8249 Family history of ischemic heart disease and other diseases of the circulatory system: Secondary | ICD-10-CM | POA: Diagnosis not present

## 2014-12-31 DIAGNOSIS — I10 Essential (primary) hypertension: Secondary | ICD-10-CM | POA: Diagnosis present

## 2014-12-31 DIAGNOSIS — Z801 Family history of malignant neoplasm of trachea, bronchus and lung: Secondary | ICD-10-CM | POA: Diagnosis not present

## 2014-12-31 DIAGNOSIS — J189 Pneumonia, unspecified organism: Secondary | ICD-10-CM

## 2014-12-31 DIAGNOSIS — R197 Diarrhea, unspecified: Secondary | ICD-10-CM | POA: Diagnosis not present

## 2014-12-31 DIAGNOSIS — Z7951 Long term (current) use of inhaled steroids: Secondary | ICD-10-CM | POA: Diagnosis not present

## 2014-12-31 DIAGNOSIS — K529 Noninfective gastroenteritis and colitis, unspecified: Secondary | ICD-10-CM | POA: Diagnosis present

## 2014-12-31 DIAGNOSIS — A481 Legionnaires' disease: Secondary | ICD-10-CM | POA: Diagnosis present

## 2014-12-31 DIAGNOSIS — E871 Hypo-osmolality and hyponatremia: Secondary | ICD-10-CM | POA: Diagnosis not present

## 2014-12-31 DIAGNOSIS — Z87891 Personal history of nicotine dependence: Secondary | ICD-10-CM | POA: Diagnosis not present

## 2014-12-31 DIAGNOSIS — Z7982 Long term (current) use of aspirin: Secondary | ICD-10-CM | POA: Diagnosis not present

## 2014-12-31 DIAGNOSIS — E876 Hypokalemia: Secondary | ICD-10-CM | POA: Diagnosis present

## 2014-12-31 DIAGNOSIS — D649 Anemia, unspecified: Secondary | ICD-10-CM | POA: Diagnosis present

## 2014-12-31 DIAGNOSIS — Z833 Family history of diabetes mellitus: Secondary | ICD-10-CM | POA: Diagnosis not present

## 2014-12-31 DIAGNOSIS — K219 Gastro-esophageal reflux disease without esophagitis: Secondary | ICD-10-CM | POA: Diagnosis present

## 2014-12-31 DIAGNOSIS — Z800 Family history of malignant neoplasm of digestive organs: Secondary | ICD-10-CM | POA: Diagnosis not present

## 2014-12-31 DIAGNOSIS — K838 Other specified diseases of biliary tract: Secondary | ICD-10-CM | POA: Diagnosis not present

## 2014-12-31 DIAGNOSIS — R740 Nonspecific elevation of levels of transaminase and lactic acid dehydrogenase [LDH]: Secondary | ICD-10-CM | POA: Diagnosis not present

## 2014-12-31 DIAGNOSIS — E785 Hyperlipidemia, unspecified: Secondary | ICD-10-CM | POA: Diagnosis present

## 2014-12-31 DIAGNOSIS — M8580 Other specified disorders of bone density and structure, unspecified site: Secondary | ICD-10-CM | POA: Diagnosis present

## 2014-12-31 DIAGNOSIS — Z90710 Acquired absence of both cervix and uterus: Secondary | ICD-10-CM | POA: Diagnosis not present

## 2014-12-31 DIAGNOSIS — E860 Dehydration: Secondary | ICD-10-CM | POA: Diagnosis present

## 2014-12-31 DIAGNOSIS — R112 Nausea with vomiting, unspecified: Secondary | ICD-10-CM | POA: Diagnosis not present

## 2014-12-31 DIAGNOSIS — R1013 Epigastric pain: Secondary | ICD-10-CM | POA: Diagnosis not present

## 2014-12-31 DIAGNOSIS — R7401 Elevation of levels of liver transaminase levels: Secondary | ICD-10-CM

## 2014-12-31 LAB — COMPREHENSIVE METABOLIC PANEL
ALK PHOS: 52 U/L (ref 39–117)
ALT: 143 U/L — ABNORMAL HIGH (ref 0–35)
ANION GAP: 7 (ref 5–15)
AST: 269 U/L — ABNORMAL HIGH (ref 0–37)
Albumin: 2.6 g/dL — ABNORMAL LOW (ref 3.5–5.2)
BUN: 13 mg/dL (ref 6–23)
CHLORIDE: 104 meq/L (ref 96–112)
CO2: 20 mmol/L (ref 19–32)
Calcium: 7.3 mg/dL — ABNORMAL LOW (ref 8.4–10.5)
Creatinine, Ser: 0.71 mg/dL (ref 0.50–1.10)
GFR calc Af Amer: >90 mL/min (ref >90)
GFR calc non Af Amer: 86 mL/min — ABNORMAL LOW (ref >90)
Glucose, Bld: 118 mg/dL — ABNORMAL HIGH (ref 70–99)
POTASSIUM: 3.2 mmol/L — AB (ref 3.5–5.1)
Sodium: 131 mmol/L — ABNORMAL LOW (ref 135–145)
Total Bilirubin: 0.7 mg/dL (ref 0.3–1.2)
Total Protein: 5.5 g/dL — ABNORMAL LOW (ref 6.0–8.3)

## 2014-12-31 LAB — CBC
HCT: 29.3 % — ABNORMAL LOW (ref 36.0–46.0)
Hemoglobin: 10.4 g/dL — ABNORMAL LOW (ref 12.0–15.0)
MCH: 32.8 pg (ref 26.0–34.0)
MCHC: 35.5 g/dL (ref 30.0–36.0)
MCV: 92.4 fL (ref 78.0–100.0)
PLATELETS: 154 10*3/uL (ref 150–400)
RBC: 3.17 MIL/uL — AB (ref 3.87–5.11)
RDW: 12.4 % (ref 11.5–15.5)
WBC: 8.9 10*3/uL (ref 4.0–10.5)

## 2014-12-31 LAB — EXPECTORATED SPUTUM ASSESSMENT W REFEX TO RESP CULTURE

## 2014-12-31 LAB — INFLUENZA PANEL BY PCR (TYPE A & B)
H1N1 flu by pcr: NOT DETECTED
INFLAPCR: NEGATIVE
Influenza B By PCR: NEGATIVE

## 2014-12-31 LAB — STREP PNEUMONIAE URINARY ANTIGEN: Strep Pneumo Urinary Antigen: NEGATIVE

## 2014-12-31 MED ORDER — SODIUM CHLORIDE 0.9 % IV SOLN
INTRAVENOUS | Status: DC
  Administered 2014-12-31 – 2015-01-01 (×2): via INTRAVENOUS

## 2014-12-31 MED ORDER — ACETAMINOPHEN 325 MG PO TABS
650.0000 mg | ORAL_TABLET | Freq: Four times a day (QID) | ORAL | Status: DC | PRN
  Administered 2014-12-31 – 2015-01-01 (×2): 650 mg via ORAL
  Filled 2014-12-31 (×3): qty 2

## 2014-12-31 MED ORDER — POTASSIUM CHLORIDE 10 MEQ/100ML IV SOLN
10.0000 meq | INTRAVENOUS | Status: AC
  Administered 2014-12-31 (×4): 10 meq via INTRAVENOUS
  Filled 2014-12-31 (×4): qty 100

## 2014-12-31 MED ORDER — ZOLPIDEM TARTRATE 5 MG PO TABS
5.0000 mg | ORAL_TABLET | Freq: Once | ORAL | Status: AC
  Administered 2014-12-31: 5 mg via ORAL
  Filled 2014-12-31: qty 1

## 2014-12-31 NOTE — Progress Notes (Signed)
PROGRESS NOTE  Stacey Moody IOE:[id number] DOB: 1946-06-22 DOA: 12/30/2014 PCP: Odette Fraction, MD  Summary: 69 year old woman presented with history of nausea, vomiting, diarrhea for 3 days, seen in the emergency department 1/8 for the same and discharged, who developed fever and return to the emergency department 1/9. Absolutely no abdominal pain. Imaging including acute abdominal series and CT abdomen and pelvis relatively unrevealing and she was admitted for further evaluation.  Assessment/Plan: 1. N/V/Diarrhea, no recent abx. Suspect viral gastroenteritis, note elevated LFTs of unclear significance, she has no right upper quadrant pain, alkaline phosphatase and total bilirubin are within normal limits and her right upper quadrant ultrasound was unrevealing with mild proximal and dilated common bile duct of doubtful significance. 2. Possible CAP. No hypoxia or pulmonary symptoms CT suggested. 3. Elevated transaminases. Etiology unclear. Differential would include viral gastroenteritis, legionella pneumonia, acute hepatitis. 4. Hypokalemia. 5. Hyponatremia. Improving. Likely secondary to dehydration. 6. Normocytic anemia. Follow clinically.   Somewhat better, nontoxic in appearance. Continue clear liquid diet.  Continue empiric Levaquin for pneumonia, check Legionella screen, sputum culture. No clinical findings to suggest cholecystitis.  GI pathogen panel  Hepatitis panel  Replete potassium   CBC and CMP in AM  Code Status: full code DVT prophylaxis: SCDs Family Communication: Discuss with multiple family members at bedside  Disposition Plan: [place], MD  [facility]  Pager [phone] If 7PM-7AM, please contact night-coverage at [email or web address], password [facility] 12/31/2014, 8:55 AM  LOS: 1 day   Consultants:    Procedures:    Antibiotics:  Levaquin 1/9 >>  HPI/Subjective: Feels better. No vomiting, no nausea, no abdominal pain. No cough, no SOB,  no sick contacts.  Sleeping: no sleep Eating/GI: hungry, wants to eat; diarrhea continues, multiple episodes, no blood Breathing: fine  Objective: Filed Vitals:   12/30/14 2100 12/30/14 2218 12/30/14 2225 12/31/14 0522  BP: 138/56  126/50 151/48  Pulse:   86 100  Temp:   99.1 F (37.3 C) 99.9 F (37.7 C)  TempSrc:   Oral Oral  Resp:   18 18  Height:  5\' 3"  (1.6 m)    Weight:   63.4 kg (139 lb 12.4 oz)   SpO2:   96% 93%    Intake/Output Summary (Last 24 hours) at 12/31/14 0855 Last data filed at 12/31/14 0600  Gross per 24 hour  Intake  712.5 ml  Output      0 ml  Net  712.5 ml     Filed Weights   12/30/14 1718 12/30/14 2225  Weight: 58.968 kg (130 lb) 63.4 kg (139 lb 12.4 oz)    Exam:     Afebrile, VSS, no hypoxia General:  Appears calm and comfortable Eyes: PERRL, normal lids, irises & conjunctiva Cardiovascular: RRR, no m/r/g. No LE edema. Respiratory: CTA bilaterally, no w/r/r. Normal respiratory effort. Abdomen: soft, ntnd, hyperactive bowel sounds Psychiatric: grossly normal mood and affect, speech fluent and appropriate  Data Reviewed:  BM x5  Potassium 3.2  Transaminases elevated, AST 269, ALT 143  Hemoglobin 10.4  Pertinent data:  Labs  Sodium 129 >> 131  AST 107 >> to 69  ALT 70 >> 143  Troponin negative  Hemoglobin 11.1 >> 10.4 Imaging   Acute abdominal series: Nonobstructive bowel gas pattern.  CT abdomen and pelvis: Bilateral lobe consolidation, consider pneumonia or pneumonitis. Equivocal inflammation around the gallbladder.  Gallbladder ultrasound: Mild dilatation of the proximal common bile duct of unclear etiology and doubtful significance, no gallstones or choledocholithiasis. Gallbladder  unremarkable. Other  Influenza PCR negative  Pending data:  Blood cultures  Sputum culture  Scheduled Meds: . Influenza vac split quadrivalent PF  0.5 mL Intramuscular Tomorrow-1000  . levofloxacin (LEVAQUIN) IV  750 mg  Intravenous QHS   Continuous Infusions: . 0.9 % NaCl with KCl 20 mEq / L 75 mL/hr at 12/30/14 2230    Principal Problem:   Nausea vomiting and diarrhea Active Problems:   Gastroenteritis   Hypokalemia   Hyponatremia   Dehydration   Fever   Elevated transaminase level   CAP (community acquired pneumonia)   Time spent 25 minutes

## 2014-12-31 NOTE — Progress Notes (Signed)
Patient/Family oriented to room. Information packet given to patient/family. Admission inpatient armband information verified with patient/family to include name and date of birth and placed on patient arm. Side rails up x 2, fall assessment and education completed with patient/family. Call light within reach. Patient/family able to voice and demonstrate understanding of unit orientation instructions  

## 2014-12-31 NOTE — Progress Notes (Signed)
Pt temperature 102.2 at 1323.  Notified Dr Sarajane Jews, order placed for Tylenol PRN.  Will administer and continue to monitor pt.

## 2014-12-31 NOTE — Progress Notes (Signed)
UR completed 

## 2015-01-01 LAB — CBC
HEMATOCRIT: 33.4 % — AB (ref 36.0–46.0)
HEMOGLOBIN: 11.6 g/dL — AB (ref 12.0–15.0)
MCH: 32.1 pg (ref 26.0–34.0)
MCHC: 34.7 g/dL (ref 30.0–36.0)
MCV: 92.5 fL (ref 78.0–100.0)
Platelets: 187 10*3/uL (ref 150–400)
RBC: 3.61 MIL/uL — AB (ref 3.87–5.11)
RDW: 12.8 % (ref 11.5–15.5)
WBC: 8.3 10*3/uL (ref 4.0–10.5)

## 2015-01-01 LAB — HEPATITIS PANEL, ACUTE
HCV AB: NEGATIVE
HEP A IGM: NONREACTIVE
HEP B C IGM: NONREACTIVE
HEP B S AG: NEGATIVE

## 2015-01-01 LAB — COMPREHENSIVE METABOLIC PANEL
ALBUMIN: 2.6 g/dL — AB (ref 3.5–5.2)
ALT: 142 U/L — AB (ref 0–35)
ANION GAP: 8 (ref 5–15)
AST: 212 U/L — AB (ref 0–37)
Alkaline Phosphatase: 60 U/L (ref 39–117)
BUN: 14 mg/dL (ref 6–23)
CO2: 22 mmol/L (ref 19–32)
Calcium: 7.9 mg/dL — ABNORMAL LOW (ref 8.4–10.5)
Chloride: 105 mEq/L (ref 96–112)
Creatinine, Ser: 0.69 mg/dL (ref 0.50–1.10)
GFR calc Af Amer: >90 mL/min (ref >90)
GFR, EST NON AFRICAN AMERICAN: 87 mL/min — AB (ref >90)
GLUCOSE: 101 mg/dL — AB (ref 70–99)
POTASSIUM: 3.8 mmol/L (ref 3.5–5.1)
Sodium: 135 mmol/L (ref 135–145)
TOTAL PROTEIN: 5.6 g/dL — AB (ref 6.0–8.3)
Total Bilirubin: 0.6 mg/dL (ref 0.3–1.2)

## 2015-01-01 MED ORDER — ZOLPIDEM TARTRATE 5 MG PO TABS
5.0000 mg | ORAL_TABLET | Freq: Every evening | ORAL | Status: DC | PRN
Start: 2015-01-01 — End: 2015-01-02
  Administered 2015-01-01: 5 mg via ORAL
  Filled 2015-01-01: qty 1

## 2015-01-01 MED ORDER — ACETAMINOPHEN 325 MG PO TABS
650.0000 mg | ORAL_TABLET | Freq: Four times a day (QID) | ORAL | Status: DC | PRN
  Administered 2015-01-01: 650 mg via ORAL

## 2015-01-01 NOTE — Care Management Note (Addendum)
    Page 1 of 1   01/02/2015     11:51:05 AM CARE MANAGEMENT NOTE 01/02/2015  Patient:  Stacey Moody, Stacey Moody   Account Number:  [id number]  Date Initiated:  01/01/2015  Documentation initiated by:  Theophilus Kinds  Subjective/Objective Assessment:   Pt admitted from home with vomiting and diarrhea. Pt lives alone and will return home at discharge. Pt is independent with ADl's.     Action/Plan:   No CM needs noted.   Anticipated DC Date:  01/03/2015   Anticipated DC Plan:  [place]  CM consult      Choice offered to / List presented to:             Status of service:  Completed, signed off Medicare Important Message given?  YES (If response is "NO", the following Medicare IM given date fields will be blank) Date Medicare IM given:  01/02/2015 Medicare IM given by:  Theophilus Kinds Date Additional Medicare IM given:   Additional Medicare IM given by:    Discharge Disposition:  HOME/SELF CARE  Per UR Regulation:    If discussed at Long Length of Stay Meetings, dates discussed:    Comments:  01/02/15 Francis, RN BSN CM Pt discharged home today. No CM needs noted.  01/01/15 Parker, RN BSN CM

## 2015-01-01 NOTE — Progress Notes (Signed)
PROGRESS NOTE  Stacey Moody FGH:[id number] DOB: 08-15-1946 DOA: 12/30/2014 PCP: Odette Fraction, MD  Summary: 69 year old woman presented with history of nausea, vomiting, diarrhea for 3 days, seen in the emergency department 1/8 for the same and discharged, who developed fever and return to the emergency department 1/9. Absolutely no abdominal pain. Imaging including acute abdominal series and CT abdomen and pelvis relatively unrevealing and she was admitted for further evaluation.  Assessment/Plan: 1. N/V/Diarrhea, no recent abx. Suspect viral gastroenteritis, note elevated LFTs of unclear significance, she has no right upper quadrant pain, alkaline phosphatase and total bilirubin are within normal limits and her right upper quadrant ultrasound was unrevealing with mild proximal and dilated common bile duct of doubtful significance. 2. Possible CAP. No hypoxia or pulmonary symptoms CT suggested. 3. Elevated transaminases. Trending down. Etiology unclear. Differential would include viral gastroenteritis, legionella pneumonia. Acute hepatitis panel negative. 4. Hypokalemia. 5. Hyponatremia. Resolved.  Likely secondary to dehydration. 6. Normocytic anemia. Remains stable.    Better today, n/v/d has resolved. Advance to bland diet  Monitor fever curve, afebrile 24 hours  Continue empiric Levaquin though no pulm symptoms  Check CMP in AM  Code Status: full code DVT prophylaxis: SCDs Family Communication: Discuss with family at bedside  Disposition Plan: Home   Murray Hodgkins, MD  [facility]  Pager [phone] If 7PM-7AM, please contact night-coverage at [email or web address], password  [facility] 01/01/2015, 2:48 PM  LOS: 2 days   Consultants:    Procedures:    Antibiotics:  Levaquin 1/9 >>  HPI/Subjective: Feels better. Nausea, vomiting have resolved. No abdominal pain, never had any. Tolerating ice and sips. Slept well. Diarrhea has slowed down, 3 today, less  volume.  Objective: Filed Vitals:   12/31/14 1625 01/01/15 0007 01/01/15 0613 01/01/15 1417  BP:  137/45 121/20 140/52  Pulse:  93 68 84  Temp: 98.2 F (36.8 C) 99.3 F (37.4 C) 98.9 F (37.2 C) 100.4 F (38 C)  TempSrc: Oral Oral Oral Oral  Resp:  18 18 18   Height:      Weight:      SpO2:  94% 96% 94%    Intake/Output Summary (Last 24 hours) at 01/01/15 1448 Last data filed at 01/01/15 1400  Gross per 24 hour  Intake 1444.16 ml  Output    200 ml  Net 1244.16 ml     Filed Weights   12/30/14 1718 12/30/14 2225  Weight: 58.968 kg (130 lb) 63.4 kg (139 lb 12.4 oz)    Exam:     TM 102.3; VSS, no hypoxia General:  Appears calm and comfortable Cardiovascular: RRR, no m/r/g. No LE edema. Respiratory: CTA bilaterally, no w/r/r. Normal respiratory effort. Abdomen: soft, ntnd, no pain with firm palpation, skin appears unremarkable Psychiatric: grossly normal mood and affect, speech fluent and appropriate  Data Reviewed:  BMP unremarkable  Transaminases slightly improved  Hemoglobin stable 11.6  Pertinent data:  Labs  Sodium 129 >> 131 >> 135  AST 107 >> 269 >> 142  ALT 70 >> 143 >> 142  Troponin negative  Hemoglobin 11.1 >> 10.4 >>11.6  Hepatitis panel negative Imaging   Acute abdominal series: Nonobstructive bowel gas pattern.  CT abdomen and pelvis: Bilateral lobe consolidation, consider pneumonia or pneumonitis. Equivocal inflammation around the gallbladder.  Gallbladder ultrasound: Mild dilatation of the proximal common bile duct of unclear etiology and doubtful significance, no gallstones or choledocholithiasis. Gallbladder unremarkable. Other  Influenza PCR negative  Pending data:  Blood cultures  Sputum culture  Scheduled Meds: .  levofloxacin (LEVAQUIN) IV  750 mg Intravenous QHS   Continuous Infusions: . sodium chloride 50 mL/hr at 01/01/15 1329    Principal Problem:   Nausea vomiting and diarrhea Active Problems:    Gastroenteritis   Hypokalemia   Hyponatremia   Dehydration   Fever   Elevated transaminase level   CAP (community acquired pneumonia)   Time spent 20 minutes

## 2015-01-02 DIAGNOSIS — A481 Legionnaires' disease: Secondary | ICD-10-CM | POA: Insufficient documentation

## 2015-01-02 DIAGNOSIS — R1013 Epigastric pain: Secondary | ICD-10-CM

## 2015-01-02 DIAGNOSIS — R109 Unspecified abdominal pain: Secondary | ICD-10-CM | POA: Insufficient documentation

## 2015-01-02 LAB — FECAL LACTOFERRIN, QUANT: Fecal Lactoferrin: POSITIVE

## 2015-01-02 LAB — COMPREHENSIVE METABOLIC PANEL
ALBUMIN: 2.3 g/dL — AB (ref 3.5–5.2)
ALK PHOS: 88 U/L (ref 39–117)
ALT: 142 U/L — ABNORMAL HIGH (ref 0–35)
ANION GAP: 8 (ref 5–15)
AST: 208 U/L — AB (ref 0–37)
BUN: 14 mg/dL (ref 6–23)
CO2: 22 mmol/L (ref 19–32)
Calcium: 7.7 mg/dL — ABNORMAL LOW (ref 8.4–10.5)
Chloride: 105 mEq/L (ref 96–112)
Creatinine, Ser: 0.6 mg/dL (ref 0.50–1.10)
GFR calc non Af Amer: >90 mL/min (ref >90)
Glucose, Bld: 90 mg/dL (ref 70–99)
Potassium: 3.2 mmol/L — ABNORMAL LOW (ref 3.5–5.1)
Sodium: 135 mmol/L (ref 135–145)
TOTAL PROTEIN: 5.1 g/dL — AB (ref 6.0–8.3)
Total Bilirubin: 0.8 mg/dL (ref 0.3–1.2)

## 2015-01-02 LAB — OVA AND PARASITE EXAMINATION: Ova and parasites: NONE SEEN

## 2015-01-02 MED ORDER — LEVOFLOXACIN 750 MG PO TABS: 750.0000 mg | ORAL_TABLET | Freq: Every day | ORAL | Status: DC

## 2015-01-02 MED ORDER — ZOLPIDEM TARTRATE 5 MG PO TABS: 5.0000 mg | ORAL_TABLET | Freq: Every evening | ORAL | Status: DC | PRN

## 2015-01-02 MED ORDER — POTASSIUM CHLORIDE CRYS ER 20 MEQ PO TBCR
40.0000 meq | EXTENDED_RELEASE_TABLET | ORAL | Status: AC
  Administered 2015-01-02 (×2): 40 meq via ORAL
  Filled 2015-01-02 (×2): qty 2

## 2015-01-02 NOTE — Progress Notes (Signed)
Patient and family received discharge instructions and scripts and had no further questions or concerns.  Patient was in stable condition at discharge.  Patient's IV removed and was clean, dry, and intact at removal.   Patient was escorted to main entrance via wheel chair by nurse tech.

## 2015-01-02 NOTE — Progress Notes (Signed)
CRITICAL VALUE ALERT  Critical value received:  Positive for Legionella Antigen  Date of notification:  01/02/2015   Time of notification:  9:52 AM   Critical value read back:Yes.    Nurse who received alert:  Jovita Kussmaul, RN  MD notified (1st page):  Rizwan  Time of first page:  9:52 AM   MD notified (2nd page):  Time of second page:  Responding MD:  Wynelle Cleveland  Time MD responded:  10:01

## 2015-01-02 NOTE — Discharge Summary (Signed)
Physician Discharge Summary  Stacey Moody PFX:[id number] DOB: 09/24/1946 DOA: 12/30/2014  PCP: Odette Fraction, MD  Admit date: 12/30/2014 Discharge date: 01/02/2015  Time spent: 55 minutes     Discharge Condition: stable Diet recommendation: heart healthy  Discharge Diagnoses:  Principal Problem:   Nausea vomiting and diarrhea- Legionella infection Active Problems:   Hypokalemia   Hyponatremia   Dehydration   Fever   Gastroenteritis   Elevated transaminase level   History of present illness:  69 year old woman presented with history of nausea, vomiting, diarrhea for 3 days, seen in the emergency department 1/8 for the same and discharged, who developed fever and return to the emergency department 1/9. Absolutely no abdominal pain. Imaging including acute abdominal series and CT abdomen and pelvis relatively unrevealing and she was admitted for further evaluation.  Hospital Course:  1. N/V/Diarrhea- found to be Legionella antigen positive. Symptoms improved on Levaquin therefore with prescribe enough to complete a 7 day course. Noted elevated LFTs-  she has no right upper quadrant pain, alkaline phosphatase and total bilirubin are within normal limits and her right upper quadrant ultrasound was unrevealing with mild proximal and dilated common bile duct of doubtful significance. 2. Possible CAP. Has a mild cough which may be related to above. No consolidation noted on CXR. No hypoxia or pulmonary symptoms CT suggested. 3. Elevated transaminases. Trending down. Etiology likely legionella. Acute hepatitis panel negative. 4. Hypokalemia- have replaced 5. Hyponatremia. Resolved. Likely secondary to dehydration. 6. Normocytic anemia. Remains stable.   Discharge Exam: Filed Weights   12/30/14 1718 12/30/14 2225  Weight: 58.968 kg (130 lb) 63.4 kg (139 lb 12.4 oz)   Filed Vitals:   01/02/15 0631  BP: 141/53  Pulse: 78  Temp: 98.1 F (36.7 C)  Resp: 18    General: AAO x 3,  no distress Cardiovascular: RRR, no murmurs  Respiratory: clear to auscultation bilaterally GI: soft, non-tender, non-distended, bowel sound positive  Discharge Instructions You were cared for by a hospitalist during your hospital stay. If you have any questions about your discharge medications or the care you received while you were in the hospital after you are discharged, you can call the unit and asked to speak with the hospitalist on call if the hospitalist that took care of you is not available. Once you are discharged, your primary care physician will handle any further medical issues. Please note that NO REFILLS for any discharge medications will be authorized once you are discharged, as it is imperative that you return to your primary care physician (or establish a relationship with a primary care physician if you do not have one) for your aftercare needs so that they can reassess your need for medications and monitor your lab values.     Medication List    TAKE these medications        aspirin 325 MG tablet  Take 325 mg by mouth at bedtime.     cholecalciferol 1000 UNITS tablet  Commonly known as:  VITAMIN D  Take 1,000 Units by mouth daily.     CRESTOR 20 MG tablet  Generic drug:  rosuvastatin  TAKE 1 TABLET BY MOUTH EVERY DAY     fluticasone 50 MCG/ACT nasal spray  Commonly known as:  FLONASE  Place 2 sprays into both nostrils as needed.     hydrochlorothiazide 25 MG tablet  Commonly known as:  HYDRODIURIL  TAKE 1 TABLET BY MOUTH EVERY DAY     levofloxacin 750 MG tablet  Commonly known  asMack Hook  Take 1 tablet (750 mg total) by mouth daily.     losartan 50 MG tablet  Commonly known as:  COZAAR  TAKE 1 TABLET BY MOUTH EVERY DAY     ondansetron 8 MG tablet  Commonly known as:  ZOFRAN  Take 1 tablet (8 mg total) by mouth every 8 (eight) hours as needed.     zolpidem 5 MG tablet  Commonly known as:  AMBIEN  Take 1 tablet (5 mg total) by mouth at bedtime as  needed for sleep.       Allergies  Allergen Reactions  . Tetanus Toxoids Swelling      The results of significant diagnostics from this hospitalization (including imaging, microbiology, ancillary and laboratory) are listed below for reference.    Significant Diagnostic Studies: Ct Abdomen Pelvis W Contrast  12/30/2014   CLINICAL DATA:  Initial evaluation for vomiting diarrhea and fever for 3 day  EXAM: CT ABDOMEN AND PELVIS WITH CONTRAST  TECHNIQUE: Multidetector CT imaging of the abdomen and pelvis was performed using the standard protocol following bolus administration of intravenous contrast.  CONTRAST:  17mL OMNIPAQUE IOHEXOL 300 MG/ML SOLN, 11mL OMNIPAQUE IOHEXOL 300 MG/ML SOLN  COMPARISON:  None.  FINDINGS: There is extensive posterior left lower lobe consolidation with a small left pleural effusion. There is less extensive consolidation right lower lobe with a small right pleural effusion. There are foci of calcification posteriorly in both consolidated lungs showing rounded appearance and measuring about 3 mm. This could represent aspirated material or calcified granulomas. Heart size is normal.  Liver is normal except for mild focal fatty infiltration around the falciform ligament. Minimally increased attenuation of around the gallbladder wall in the adjacent peritoneal fat. No gallstones. Spleen is normal. Pancreas is normal.  Adrenal glands are normal. Left kidney is normal. Right kidney shows a mildly prominent extra renal pelvis with equivocal mild perinephric inflammatory change.  Heavy calcification of the abdominal aorta. Proximal iliac arteries calcified.  Stomach demonstrates small hiatal hernia. Small bowel is normal. Mild diverticulosis sigmoid colon. Appendix not identified.  Bladder is normal. Reproductive organs not identified. No free fluid. No adenopathy in the abdomen or pelvis. No acute musculoskeletal findings.  IMPRESSION: *Bilateral lower lobe consolidation with small  pleural effusions. Pneumonia or pneumonitis suspected. *Equivocal mild perinephric inflammatory change on the right with mild prominence of the renal pelvis and ureter. No calculi identified. Pyelonephritis not excluded. Correlate clinically. *Equivocal inflammation around the gallbladder. Correlate for evidence of cholecystitis.   Electronically Signed   By: Skipper Cliche M.D.   On: 12/30/2014 21:51   Dg Abd Acute W/chest  12/30/2014   CLINICAL DATA:  69 year old female with 3 day history of vomiting and fever to 103 degrees.  EXAM: ACUTE ABDOMEN SERIES (ABDOMEN 2 VIEW & CHEST 1 VIEW)  COMPARISON:  Chest x-ray 12/31/2013.  FINDINGS: No acute consolidative airspace disease. No pleural effusions. Cephalization of the pulmonary vasculature, without frank pulmonary edema. Heart size appears borderline enlarged. Atherosclerosis in the thoracic aorta.  Supine and upright views of the abdomen demonstrate gas and stool scattered throughout the colon extending to the level of the distal rectum. Several nondilated loops of gas-filled small bowel are noted, which are highly nonspecific. No pathologic dilatation of small bowel. No pneumoperitoneum.  IMPRESSION: 1. Nonobstructive bowel gas pattern. 2. No pneumoperitoneum. 3. Borderline cardiomegaly with pulmonary venous congestion, but no frank pulmonary edema. 4. Atherosclerosis.   Electronically Signed   By: Mauri Brooklyn.D.  On: 12/30/2014 19:05   US Abdomen Limited Ruq  12/31/2014   CLINICAL DATA:  69 year old female with history of abdominal pain, nausea, vomiting and diarrhea for the past 5 days.  EXAM: US ABDOMEN LIMITED - RIGHT UPPER QUADRANT  COMPARISON:  CT of the abdomen and pelvis 12/30/2014.  FINDINGS: Gallbladder:  No gallstones or wall thickening visualized. No sonographic Murphy sign noted.  Common bile duct:  Diameter: Mild focal fusiform dilatation of the proximal duct measuring up to 8.6 mm. However, this immediately normalizes, in the mid to  distal duct is otherwise normal in caliber measuring 4-5 mm. No ductal stone identified.  Liver:  No focal lesion identified. No intrahepatic biliary ductal dilatation. Within normal limits in parenchymal echogenicity. Normal hepatopetal flow in the portal vein.  Other: Trace amount of right perinephric fluid. Trace right pleural effusion.  IMPRESSION: 1. Mild fusiform dilatation of the proximal common bile duct which measures up to 8.6 mm. This is of uncertain etiology and doubtful clinical significance, as the mid to distal common bile duct is normal in caliber. No gallstones or definite choledocholithiasis noted. No intrahepatic biliary ductal dilatation. 2. Trace amount of right perinephric fluid. 3. Trace right pleural effusion.   Electronically Signed   By: Vinnie Langton M.D.   On: 12/31/2014 12:12    Microbiology: Recent Results (from the past 240 hour(s))  Stool culture     Status: None (Preliminary result)   Collection Time: 12/30/14 10:44 PM  Result Value Ref Range Status   Specimen Description STOOL  Final   Special Requests NONE  Final   Culture   Final    NO SUSPICIOUS COLONIES, CONTINUING TO HOLD Performed at [facility]    Report Status PENDING  Incomplete  Culture, sputum-assessment     Status: None   Collection Time: 12/31/14 12:10 PM  Result Value Ref Range Status   Specimen Description SPUTUM  Final   Special Requests NONE  Final   Sputum evaluation   Final    MICROSCOPIC FINDINGS SUGGEST THAT THIS SPECIMEN IS NOT REPRESENTATIVE OF LOWER RESPIRATORY SECRETIONS. PLEASE RECOLLECT. NOTIFIED BROWER B. AT [id number]  ON [id number] BY THOMPSON S.    Report Status 12/31/2014 FINAL  Final  Culture, blood (routine x 2)     Status: None (Preliminary result)   Collection Time: 12/31/14  2:08 PM  Result Value Ref Range Status   Specimen Description BLOOD RIGHT ANTECUBITAL  Final   Special Requests BOTTLES DRAWN AEROBIC AND ANAEROBIC 12CC  Final   Culture NO GROWTH 2 DAYS   Final   Report Status PENDING  Incomplete  Culture, blood (routine x 2)     Status: None (Preliminary result)   Collection Time: 12/31/14  2:15 PM  Result Value Ref Range Status   Specimen Description BLOOD RIGHT HAND  Final   Special Requests BOTTLES DRAWN AEROBIC AND ANAEROBIC 12CC  Final   Culture NO GROWTH 2 DAYS  Final   Report Status PENDING  Incomplete     Labs: Basic Metabolic Panel:  Recent Labs Lab 12/29/14 0953 12/30/14 1745 12/31/14 0640 01/01/15 0632 01/02/15 0620  NA 129* 131* 131* 135 135  K 3.4* 3.1* 3.2* 3.8 3.2*  CL 96 102 104 105 105  CO2 24 20 20 22 22   GLUCOSE 134* 115* 118* 101* 90  BUN 17 15 13 14 14   CREATININE 0.81 0.83 0.71 0.69 0.60  CALCIUM 8.5 7.7* 7.3* 7.9* 7.7*   Liver Function Tests:  Recent Labs Lab 12/30/14  1745 12/31/14 0640 01/01/15 0632 01/02/15 0620  AST 107* 269* 212* 208*  ALT 70* 143* 142* 142*  ALKPHOS 49 52 60 88  BILITOT 0.7 0.7 0.6 0.8  PROT 6.0 5.5* 5.6* 5.1*  ALBUMIN 3.1* 2.6* 2.6* 2.3*   No results for input(s): LIPASE, AMYLASE in the last 168 hours. No results for input(s): AMMONIA in the last 168 hours. CBC:  Recent Labs Lab 12/29/14 0953 12/30/14 1745 12/31/14 0640 01/01/15 0632  WBC 11.9* 10.1 8.9 8.3  NEUTROABS 10.2* 8.8*  --   --   HGB 12.7 11.1* 10.4* 11.6*  HCT 37.4 32.4* 29.3* 33.4*  MCV 94.7 94.5 92.4 92.5  PLT 176 157 154 187   Cardiac Enzymes:  Recent Labs Lab 12/30/14 2106  TROPONINI 0.03   BNP: BNP (last 3 results) No results for input(s): PROBNP in the last 8760 hours. CBG: No results for input(s): GLUCAP in the last 168 hours.     SignedDebbe Odea, MD [facility] 01/02/2015, 11:39 AM

## 2015-01-03 LAB — STOOL CULTURE

## 2015-01-04 ENCOUNTER — Encounter: Payer: Self-pay | Admitting: Family Medicine

## 2015-01-05 LAB — LEGIONELLA ANTIGEN, URINE

## 2015-01-06 LAB — CULTURE, BLOOD (ROUTINE X 2)
Culture: NO GROWTH
Culture: NO GROWTH

## 2015-01-08 LAB — GI PATHOGEN PANEL BY PCR, STOOL

## 2015-01-08 LAB — MISCELLANEOUS TEST

## 2015-03-07 ENCOUNTER — Other Ambulatory Visit: Payer: Self-pay | Admitting: Family Medicine

## 2015-03-14 ENCOUNTER — Other Ambulatory Visit: Payer: Medicare Other

## 2015-03-14 DIAGNOSIS — I10 Essential (primary) hypertension: Secondary | ICD-10-CM | POA: Diagnosis not present

## 2015-03-14 DIAGNOSIS — Z01419 Encounter for gynecological examination (general) (routine) without abnormal findings: Secondary | ICD-10-CM | POA: Diagnosis not present

## 2015-03-14 DIAGNOSIS — E785 Hyperlipidemia, unspecified: Secondary | ICD-10-CM

## 2015-03-14 LAB — CBC WITH DIFFERENTIAL/PLATELET
Basophils Absolute: 0.1 10*3/uL (ref 0.0–0.1)
Basophils Relative: 1 % (ref 0–1)
EOS PCT: 2 % (ref 0–5)
Eosinophils Absolute: 0.1 10*3/uL (ref 0.0–0.7)
HCT: 39.7 % (ref 36.0–46.0)
Hemoglobin: 13.2 g/dL (ref 12.0–15.0)
Lymphocytes Relative: 30 % (ref 12–46)
Lymphs Abs: 1.8 10*3/uL (ref 0.7–4.0)
MCH: 31.8 pg (ref 26.0–34.0)
MCHC: 33.2 g/dL (ref 30.0–36.0)
MCV: 95.7 fL (ref 78.0–100.0)
MONO ABS: 0.5 10*3/uL (ref 0.1–1.0)
MPV: 9.3 fL (ref 8.6–12.4)
Monocytes Relative: 9 % (ref 3–12)
Neutro Abs: 3.5 10*3/uL (ref 1.7–7.7)
Neutrophils Relative %: 58 % (ref 43–77)
PLATELETS: 240 10*3/uL (ref 150–400)
RBC: 4.15 MIL/uL (ref 3.87–5.11)
RDW: 13.6 % (ref 11.5–15.5)
WBC: 6 10*3/uL (ref 4.0–10.5)

## 2015-03-14 LAB — COMPLETE METABOLIC PANEL WITH GFR
ALK PHOS: 50 U/L (ref 39–117)
ALT: 15 U/L (ref 0–35)
AST: 18 U/L (ref 0–37)
Albumin: 4 g/dL (ref 3.5–5.2)
BUN: 18 mg/dL (ref 6–23)
CALCIUM: 9 mg/dL (ref 8.4–10.5)
CO2: 26 mEq/L (ref 19–32)
Chloride: 104 mEq/L (ref 96–112)
Creat: 0.61 mg/dL (ref 0.50–1.10)
GFR, Est African American: >89 mL/min
GFR, Est Non African American: >89 mL/min
GLUCOSE: 85 mg/dL (ref 70–99)
Potassium: 3.9 mEq/L (ref 3.5–5.3)
SODIUM: 141 meq/L (ref 135–145)
TOTAL PROTEIN: 6 g/dL (ref 6.0–8.3)
Total Bilirubin: 0.4 mg/dL (ref 0.2–1.2)

## 2015-03-14 LAB — TSH: TSH: 2.974 u[IU]/mL (ref 0.350–4.500)

## 2015-03-14 LAB — LIPID PANEL
CHOLESTEROL: 137 mg/dL (ref 0–200)
HDL: 62 mg/dL (ref >=46)
LDL CALC: 56 mg/dL (ref 0–99)
Total CHOL/HDL Ratio: 2.2 Ratio
Triglycerides: 94 mg/dL (ref <150)
VLDL: 19 mg/dL (ref 0–40)

## 2015-03-15 ENCOUNTER — Encounter: Payer: Self-pay | Admitting: Family Medicine

## 2015-03-15 ENCOUNTER — Ambulatory Visit (INDEPENDENT_AMBULATORY_CARE_PROVIDER_SITE_OTHER): Payer: Medicare Other | Admitting: Family Medicine

## 2015-03-15 VITALS — BP 122/60 | HR 66 | Temp 98.4°F | Resp 14 | Ht 64.0 in | Wt 141.0 lb

## 2015-03-15 DIAGNOSIS — Z23 Encounter for immunization: Secondary | ICD-10-CM

## 2015-03-15 DIAGNOSIS — Z0000 Encounter for general adult medical examination without abnormal findings: Secondary | ICD-10-CM | POA: Diagnosis not present

## 2015-03-15 NOTE — Progress Notes (Signed)
Subjective:    Patient ID: Stacey Moody, female    DOB: 1946/09/27, 69 y.o.   MRN: [id number]  HPI Patient is here today for complete physical exam. Her only concern is she still has some fatigue from when she was in the hospital recently with legionnaires disease. She also appreciates a small nodule in her breast at approximately the 6:00 position. It is located in the right breast. I performed a manual exam today and I cannot appreciate any mass or lump or nodule. Patient had a mammogram which was normal in December. She does have heterogeneously dense breast which makes it difficult to rule out all masses on mammogram. Colonoscopy was performed in 2008 and is up-to-date. Past medical history is significant for hysterectomy so that Pap smears are no longer necessary. Recent lab work as listed below: Lab on 03/14/2015  Component Date Value Ref Range Status  . Sodium 03/14/2015 141  135 - 145 mEq/L Final  . Potassium 03/14/2015 3.9  3.5 - 5.3 mEq/L Final  . Chloride 03/14/2015 104  96 - 112 mEq/L Final  . CO2 03/14/2015 26  19 - 32 mEq/L Final  . Glucose, Bld 03/14/2015 85  70 - 99 mg/dL Final  . BUN 03/14/2015 18  6 - 23 mg/dL Final  . Creat 03/14/2015 0.61  0.50 - 1.10 mg/dL Final  . Total Bilirubin 03/14/2015 0.4  0.2 - 1.2 mg/dL Final  . Alkaline Phosphatase 03/14/2015 50  39 - 117 U/L Final  . AST 03/14/2015 18  0 - 37 U/L Final  . ALT 03/14/2015 15  0 - 35 U/L Final  . Total Protein 03/14/2015 6.0  6.0 - 8.3 g/dL Final  . Albumin 03/14/2015 4.0  3.5 - 5.2 g/dL Final  . Calcium 03/14/2015 9.0  8.4 - 10.5 mg/dL Final  . GFR, Est African American 03/14/2015 >89   Final  . GFR, Est Non African American 03/14/2015 >89   Final   Comment:   The estimated GFR is a calculation valid for adults (>=41 years old) that uses the CKD-EPI algorithm to adjust for age and sex. It is   not to be used for children, pregnant women, hospitalized patients,    patients on dialysis, or with rapidly  changing kidney function. According to the NKDEP, eGFR >89 is normal, 60-89 shows mild impairment, 30-59 shows moderate impairment, 15-29 shows severe impairment and <15 is ESRD.     . WBC 03/14/2015 6.0  4.0 - 10.5 K/uL Final  . RBC 03/14/2015 4.15  3.87 - 5.11 MIL/uL Final  . Hemoglobin 03/14/2015 13.2  12.0 - 15.0 g/dL Final  . HCT 03/14/2015 39.7  36.0 - 46.0 % Final  . MCV 03/14/2015 95.7  78.0 - 100.0 fL Final  . MCH 03/14/2015 31.8  26.0 - 34.0 pg Final  . MCHC 03/14/2015 33.2  30.0 - 36.0 g/dL Final  . RDW 03/14/2015 13.6  11.5 - 15.5 % Final  . Platelets 03/14/2015 240  150 - 400 K/uL Final  . MPV 03/14/2015 9.3  8.6 - 12.4 fL Final  . Neutrophils Relative % 03/14/2015 58  43 - 77 % Final  . Neutro Abs 03/14/2015 3.5  1.7 - 7.7 K/uL Final  . Lymphocytes Relative 03/14/2015 30  12 - 46 % Final  . Lymphs Abs 03/14/2015 1.8  0.7 - 4.0 K/uL Final  . Monocytes Relative 03/14/2015 9  3 - 12 % Final  . Monocytes Absolute 03/14/2015 0.5  0.1 - 1.0 K/uL Final  .  Eosinophils Relative 03/14/2015 2  0 - 5 % Final  . Eosinophils Absolute 03/14/2015 0.1  0.0 - 0.7 K/uL Final  . Basophils Relative 03/14/2015 1  0 - 1 % Final  . Basophils Absolute 03/14/2015 0.1  0.0 - 0.1 K/uL Final  . Smear Review 03/14/2015 Criteria for review not met   Final  . Cholesterol 03/14/2015 137  0 - 200 mg/dL Final   Comment: ATP III Classification:       < 200        mg/dL        Desirable      200 - 239     mg/dL        Borderline High      >= 240        mg/dL        High     . Triglycerides 03/14/2015 94  <150 mg/dL Final  . HDL 03/14/2015 62  >=46 mg/dL Final   ** Please note change in reference range(s). **  . Total CHOL/HDL Ratio 03/14/2015 2.2   Final  . VLDL 03/14/2015 19  0 - 40 mg/dL Final  . LDL Cholesterol 03/14/2015 56  0 - 99 mg/dL Final   Comment:   Total Cholesterol/HDL Ratio:CHD Risk                        Coronary Heart Disease Risk Table                                        Men        Women          1/2 Average Risk              3.4        3.3              Average Risk              5.0        4.4           2X Average Risk              9.6        7.1           3X Average Risk             23.4       11.0 Use the calculated Patient Ratio above and the CHD Risk table  to determine the patient's CHD Risk. ATP III Classification (LDL):       < 100        mg/dL         Optimal      100 - 129     mg/dL         Near or Above Optimal      130 - 159     mg/dL         Borderline High      160 - 189     mg/dL         High       > 190        mg/dL         Very High     . TSH 03/14/2015 2.974  0.350 - 4.500 uIU/mL Final   Past Medical History  Diagnosis Date  .  Carotid artery occlusion   . Osteopenia   . Hypertension   . Hyperlipidemia   . GERD (gastroesophageal reflux disease)   . Influenza A 01/01/2014  . Syncope 12/31/2013    Vasovagal or hypovolemia/orthostatic   Past Surgical History  Procedure Laterality Date  . Abdominal hysterectomy  1982    Partial hysterectomy  . Small intestine surgery    . Ct scan  July 2015    Chest   Current Outpatient Prescriptions on File Prior to Visit  Medication Sig Dispense Refill  . aspirin 325 MG tablet Take 325 mg by mouth at bedtime.     . cholecalciferol (VITAMIN D) 1000 UNITS tablet Take 1,000 Units by mouth daily.    . CRESTOR 20 MG tablet TAKE 1 TABLET BY MOUTH EVERY DAY 30 tablet 3  . fluticasone (FLONASE) 50 MCG/ACT nasal spray Place 2 sprays into both nostrils as needed.    . hydrochlorothiazide (HYDRODIURIL) 25 MG tablet TAKE 1 TABLET BY MOUTH EVERY DAY 30 tablet 6  . losartan (COZAAR) 50 MG tablet TAKE 1 TABLET BY MOUTH EVERY DAY 30 tablet 2  . [DISCONTINUED] doxazosin (CARDURA) 4 MG tablet Take 1 tablet (4 mg total) by mouth daily. (Patient not taking: Reported on 12/29/2014) 30 tablet 3   No current facility-administered medications on file prior to visit.   Allergies  Allergen Reactions  . Tetanus Toxoids  Swelling   History   Social History  . Marital Status: Widowed    Spouse Name: N/A  . Number of Children: N/A  . Years of Education: N/A   Occupational History  . Not on file.   Social History Main Topics  . Smoking status: Former Smoker    Quit date: 12/22/1996  . Smokeless tobacco: Never Used  . Alcohol Use: No  . Drug Use: No  . Sexual Activity: Not on file   Other Topics Concern  . Not on file   Social History Narrative   Family History  Problem Relation Age of Onset  . Cancer Mother     pancratic  . Heart attack Mother   . Cancer Father     stomach  . Diabetes Father   . Hyperlipidemia Father   . Heart disease Father     NOT  before age 76  . Cancer Sister     lung  . Hyperlipidemia Sister   . Cancer Brother     thyroid, prostate  . Hyperlipidemia Brother   . Heart attack Brother   . Cancer Sister     colon  . Hyperlipidemia Sister   . Cancer Sister     colon  . Hyperlipidemia Sister       Review of Systems  All other systems reviewed and are negative.      Objective:   Physical Exam  Constitutional: She is oriented to person, place, and time. She appears well-developed and well-nourished. No distress.  HENT:  Head: Normocephalic and atraumatic.  Right Ear: External ear normal.  Left Ear: External ear normal.  Nose: Nose normal.  Mouth/Throat: Oropharynx is clear and moist. No oropharyngeal exudate.  Eyes: Conjunctivae are normal. Pupils are equal, round, and reactive to light. Right eye exhibits no discharge. Left eye exhibits no discharge. No scleral icterus.  Neck: Normal range of motion. Neck supple. No JVD present. No tracheal deviation present. No thyromegaly present.  Cardiovascular: Normal rate, regular rhythm, normal heart sounds and intact distal pulses.  Exam reveals no gallop and no friction rub.  No murmur heard. Pulmonary/Chest: Effort normal and breath sounds normal. No stridor. No respiratory distress. She has no wheezes.  She has no rales. She exhibits no tenderness.  Abdominal: Soft. Bowel sounds are normal. She exhibits no distension and no mass. There is no tenderness. There is no rebound and no guarding.  Musculoskeletal: Normal range of motion. She exhibits no edema or tenderness.  Lymphadenopathy:    She has no cervical adenopathy.  Neurological: She is alert and oriented to person, place, and time. She has normal reflexes. She displays normal reflexes. No cranial nerve deficit. She exhibits normal muscle tone. Coordination normal.  Skin: Skin is warm. No rash noted. She is not diaphoretic. No erythema. No pallor.  Psychiatric: She has a normal mood and affect. Her behavior is normal. Judgment and thought content normal.  Vitals reviewed.         Assessment & Plan:  Routine general medical examination at a health care facility  Patient's physical exam today is completely normal. Her lab work is excellent. I believe her fatigue is likely related to her recent legionnaires disease. Patient was extremely sick. I do feel it is normal for the patient to still feel somewhat fatigued only 2 months removed. She has no other concerning symptoms. The remainder of her review of systems is negative. Her lab work is excellent. The remainder of her cancer screening is up-to-date. She will received Prevnar 13 today in office. Otherwise Pneumovax, shingles vaccine are both up-to-date. Patient and I agreed to monitor the lump in her breast for the next month. If the lump persists or grows I will schedule the patient for an ultrasound of the right breast. Today I cannot appreciate any mass on exam.

## 2015-04-09 ENCOUNTER — Other Ambulatory Visit: Payer: Self-pay | Admitting: Family Medicine

## 2015-04-10 NOTE — Telephone Encounter (Signed)
Medication refilled per protocol. 

## 2015-04-25 ENCOUNTER — Telehealth: Payer: Self-pay | Admitting: Family Medicine

## 2015-04-25 NOTE — Telephone Encounter (Signed)
Patient is calling to say that last week she fell off a stool, she was fine, but now has bruising and it is hurting would like a call back, thinks that maybe she needs an xray  [phone]

## 2015-04-25 NOTE — Telephone Encounter (Signed)
LMTRC

## 2015-04-25 NOTE — Telephone Encounter (Signed)
Spoke to patient and she fell last Sunday and now her leg is very achy - informed pt that it would be a good idea to be seen in office - she agreed and appt made

## 2015-04-26 ENCOUNTER — Ambulatory Visit (INDEPENDENT_AMBULATORY_CARE_PROVIDER_SITE_OTHER): Payer: Medicare Other | Admitting: Family Medicine

## 2015-04-26 ENCOUNTER — Ambulatory Visit (HOSPITAL_COMMUNITY)
Admission: RE | Admit: 2015-04-26 | Discharge: 2015-04-26 | Disposition: A | Discharge status: Home / Self Care | Payer: Medicare Other | Source: Ambulatory Visit | Attending: Family Medicine | Admitting: Family Medicine

## 2015-04-26 ENCOUNTER — Encounter: Payer: Self-pay | Admitting: Family Medicine

## 2015-04-26 VITALS — BP 170/68 | HR 76 | Temp 98.0°F | Resp 16 | Ht 64.0 in | Wt 142.0 lb

## 2015-04-26 DIAGNOSIS — I70208 Unspecified atherosclerosis of native arteries of extremities, other extremity: Secondary | ICD-10-CM | POA: Diagnosis not present

## 2015-04-26 DIAGNOSIS — Z87828 Personal history of other (healed) physical injury and trauma: Secondary | ICD-10-CM | POA: Diagnosis not present

## 2015-04-26 DIAGNOSIS — M79604 Pain in right leg: Secondary | ICD-10-CM

## 2015-04-26 NOTE — Progress Notes (Signed)
Subjective:    Patient ID: Stacey Moody, female    DOB: [birth year], 69 y.o.   MRN: [id number]  HPI Last week, the patient was standing on a ladder.  She went to step down off a ladder and missed the step and struck the anterior portion of her right shin on the [place]. There is a superficial tear in the skin approximately 5 cm long. It is linear. It is healing well. There is no evidence of infection. However the patient has severe tenderness to palpation just lateral to the tibia over the anterior muscle compartment. Pain is out of proportion to visible examination. There is a large ecchymosis on the anterior ankle distal to the injury site which appears to be more blood has tracked down in the leg due to gravity and accumulated at the ankle. She has no pain in the ankle. She has normal range of motion without pain in the knee and ankle. She is able to bear weight on her leg but it causes mod pain over the anterior shin. Past Medical History  Diagnosis Date  . Carotid artery occlusion   . Osteopenia   . Hypertension   . Hyperlipidemia   . GERD (gastroesophageal reflux disease)   . Influenza A 01/01/2014  . Syncope 12/31/2013    Vasovagal or hypovolemia/orthostatic   Past Surgical History  Procedure Laterality Date  . Abdominal hysterectomy  1982    Partial hysterectomy  . Small intestine surgery    . Ct scan  July 2015    Chest   Current Outpatient Prescriptions on File Prior to Visit  Medication Sig Dispense Refill  . aspirin 325 MG tablet Take 325 mg by mouth at bedtime.     . cholecalciferol (VITAMIN D) 1000 UNITS tablet Take 1,000 Units by mouth daily.    . CRESTOR 20 MG tablet TAKE 1 TABLET BY MOUTH EVERY DAY 30 tablet 3  . fluticasone (FLONASE) 50 MCG/ACT nasal spray Place 2 sprays into both nostrils as needed.    . hydrochlorothiazide (HYDRODIURIL) 25 MG tablet TAKE 1 TABLET BY MOUTH EVERY DAY 30 tablet 6  . losartan (COZAAR) 50 MG tablet TAKE 1 TABLET BY MOUTH EVERY DAY 30  tablet 5  . [DISCONTINUED] doxazosin (CARDURA) 4 MG tablet Take 1 tablet (4 mg total) by mouth daily. (Patient not taking: Reported on 12/29/2014) 30 tablet 3   No current facility-administered medications on file prior to visit.   Allergies  Allergen Reactions  . Tetanus Toxoids Swelling   History   Social History  . Marital Status: Widowed    Spouse Name: N/A  . Number of Children: N/A  . Years of Education: N/A   Occupational History  . Not on file.   Social History Main Topics  . Smoking status: Former Smoker    Quit date: 12/22/1996  . Smokeless tobacco: Never Used  . Alcohol Use: No  . Drug Use: No  . Sexual Activity: Not on file   Other Topics Concern  . Not on file   Social History Narrative       Review of Systems  All other systems reviewed and are negative.      Objective:   Physical Exam  Cardiovascular: Normal rate, regular rhythm, normal heart sounds and intact distal pulses.   No murmur heard. Pulmonary/Chest: Effort normal and breath sounds normal. No respiratory distress. She has no wheezes. She has no rales.  Abdominal: Soft.  Musculoskeletal: She exhibits no edema.  Right knee: Normal.       Right ankle: Normal.  Vitals reviewed.  please see the description in the history of present illness        Assessment & Plan:  Right leg pain - Plan: DG Tibia/Fibula Right  I believe the patient has a hematoma in the muscle compartment just lateral to the shin adjacent to where she suffered a small laceration. I believe this hematoma is that is causing her pain. I believe the ecchymosis over her ankle is due to the accumulation of blood from the initial injury. There is no visible or physical injury to the ankle. I will obtain an x-ray of the tibia and fibula to rule out a stress fracture. If the x-ray is normal I will treat this symptomatically with elevation, ice, compression, and ibuprofen. I anticipate gradual improvement over the next 2  weeks

## 2015-05-07 ENCOUNTER — Other Ambulatory Visit: Payer: Self-pay | Admitting: Family Medicine

## 2015-05-08 NOTE — Telephone Encounter (Signed)
Medication refill per protocol °

## 2015-07-07 ENCOUNTER — Other Ambulatory Visit: Payer: Self-pay | Admitting: Family Medicine

## 2015-07-09 NOTE — Telephone Encounter (Signed)
Refill appropriate and filled per protocol. 

## 2015-07-19 ENCOUNTER — Encounter: Payer: Self-pay | Admitting: Family

## 2015-07-24 ENCOUNTER — Encounter: Payer: Self-pay | Admitting: Family

## 2015-07-24 ENCOUNTER — Ambulatory Visit (INDEPENDENT_AMBULATORY_CARE_PROVIDER_SITE_OTHER): Payer: Medicare Other | Admitting: Family

## 2015-07-24 ENCOUNTER — Ambulatory Visit (HOSPITAL_COMMUNITY)
Admission: RE | Admit: 2015-07-24 | Discharge: 2015-07-24 | Disposition: A | Discharge status: Home / Self Care | Payer: Medicare Other | Source: Ambulatory Visit | Attending: Vascular Surgery | Admitting: Vascular Surgery

## 2015-07-24 VITALS — BP 145/67 | HR 63 | Temp 97.5°F | Resp 14 | Ht 63.0 in | Wt 143.0 lb

## 2015-07-24 DIAGNOSIS — I6523 Occlusion and stenosis of bilateral carotid arteries: Secondary | ICD-10-CM

## 2015-07-24 NOTE — Progress Notes (Signed)
Established Carotid Patient   History of Present Illness  Stacey Moody is a 69 y.o. female patient of Dr. Donnetta Hutching who presents today for followup of her known asymptomatic extracranial cerebrovascular occlusive disease. She remains quite active in excellent health with no neurologic deficits.  She has no cardiac disease  She states her blood pressure at home usually runs 140's/60's-70's and always increases in a medical office. She stays physically active, denies ETOH use. She denies claudication symptoms with walking.  Patient has not had previous carotid artery intervention.  The patient denies any history of TIA or stroke symptoms, specifically the patient denies a history of amaurosis fugax or monocular blindness, denies a history unilateral facial drooping, denies a history of hemiplegia, and denies a history of receptive or expressive aphasia.    The patient denies New Medical or Surgical History.  Pt Diabetic: no Pt smoker: former smoker, quit about 1998  Pt meds include: Statin : yes ASA: yes Other anticoagulants/antiplatelets: no   Past Medical History  Diagnosis Date  . Carotid artery occlusion   . Osteopenia   . Hypertension   . Hyperlipidemia   . GERD (gastroesophageal reflux disease)   . Influenza A 01/01/2014  . Syncope 12/31/2013    Vasovagal or hypovolemia/orthostatic    Social History History  Substance Use Topics  . Smoking status: Former Smoker    Quit date: 12/22/1996  . Smokeless tobacco: Never Used  . Alcohol Use: No    Family History Family History  Problem Relation Age of Onset  . Cancer Mother     pancratic  . Heart attack Mother   . Cancer Father     stomach  . Diabetes Father   . Hyperlipidemia Father   . Heart disease Father     NOT  before age 60  . Cancer Sister     lung  . Hyperlipidemia Sister   . Cancer Brother     thyroid, prostate  . Hyperlipidemia Brother   . Heart attack Brother   . Cancer Sister     colon  .  Hyperlipidemia Sister   . Cancer Sister     colon  . Hyperlipidemia Sister     Surgical History Past Surgical History  Procedure Laterality Date  . Abdominal hysterectomy  1982    Partial hysterectomy  . Small intestine surgery    . Ct scan  July 2015    Chest    Allergies  Allergen Reactions  . Tetanus Toxoids Swelling    Current Outpatient Prescriptions  Medication Sig Dispense Refill  . aspirin 325 MG tablet Take 325 mg by mouth at bedtime.     . cholecalciferol (VITAMIN D) 1000 UNITS tablet Take 1,000 Units by mouth daily.    . Cyanocobalamin (VITAMIN B 12 PO) Take 1,000 mg by mouth daily.    . fluticasone (FLONASE) 50 MCG/ACT nasal spray Place 2 sprays into both nostrils as needed.    . hydrochlorothiazide (HYDRODIURIL) 25 MG tablet TAKE 1 TABLET BY MOUTH EVERY DAY 30 tablet 4  . losartan (COZAAR) 50 MG tablet TAKE 1 TABLET BY MOUTH EVERY DAY 30 tablet 5  . rosuvastatin (CRESTOR) 20 MG tablet TAKE 1 TABLET BY MOUTH EVERY DAY 30 tablet 3  . [DISCONTINUED] doxazosin (CARDURA) 4 MG tablet Take 1 tablet (4 mg total) by mouth daily. (Patient not taking: Reported on 12/29/2014) 30 tablet 3   No current facility-administered medications for this visit.    Review of Systems : See HPI for  pertinent positives and negatives.  Physical Examination  Filed Vitals:   07/24/15 1034 07/24/15 1036 07/24/15 1038  BP: 155/61 150/72 145/67  Pulse: 62 63 63  Temp: 97.5 F (36.4 C)    Resp: 14    Height: 5\' 3"  (1.6 m)    Weight: 143 lb (64.864 kg)    SpO2: 98%     Body mass index is 25.34 kg/(m^2).  General: WDWN female in NAD GAIT: normal Eyes: PERRLA Pulmonary:  Non-labored, CTAB, Negative  Rales, Negative rhonchi, & Negative wheezing.  Cardiac: regular rhythm, no detected murmur.  VASCULAR EXAM Carotid Bruits Right Left   Negative Negative    Aorta is not palpable. Radial pulses are 2+ palpable and equal.                                                                                                                             LE Pulses Right Left       POPLITEAL  not palpable   not palpable       POSTERIOR TIBIAL  2+ palpable   2+ palpable        DORSALIS PEDIS      ANTERIOR TIBIAL not palpable  not palpable     Gastrointestinal: soft, nontender, BS WNL, no r/g,  no palpable masses.  Musculoskeletal: No muscle atrophy/wasting. M/S 5/5 throughout, extremities without ischemic changes.  Neurologic: A&O X 3; Appropriate Affect, Speech is normal CN 2-12 intact, pain and light touch intact in extremities, Motor exam as listed above.   Non-Invasive Vascular Imaging CAROTID DUPLEX 07/24/2015   CEREBROVASCULAR DUPLEX EVALUATION    INDICATION: Carotid artery disease    PREVIOUS INTERVENTION(S): None    DUPLEX EXAM: Carotid duplex    RIGHT  LEFT  Peak Systolic Velocities (cm/s) End Diastolic Velocities (cm/s) Plaque LOCATION Peak Systolic Velocities (cm/s) End Diastolic Velocities (cm/s) Plaque  93 15 - CCA PROXIMAL 86 17 -  91 20 - CCA MID 84 19 -  86 25 HT CCA DISTAL 83 25 HT  270 26 HT ECA 242 18 HT  170 36 HT ICA PROXIMAL 212 51 HT  136 38 - ICA MID 127 27 -  90 25 - ICA DISTAL 91 25 -    1.97 ICA / CCA Ratio (PSV) 2.55  Antegrade Vertebral Flow Antegrade   Brachial Systolic Pressure (mmHg)   Triphasic Brachial Artery Waveforms Triphasic    Plaque Morphology:  HM = Homogeneous, HT = Heterogeneous, CP = Calcific Plaque, SP = Smooth Plaque, IP = Irregular Plaque  ADDITIONAL FINDINGS:     IMPRESSION: 1. Less than 40% right internal carotid artery stenosis. 2. 40 - 59% left internal carotid artery stenosis. 3. Bilateral external carotid artery stenosis.    Compared to the previous exam:  No significant change.      Assessment: Stacey Moody is a 69 y.o. female who has a history of mild/moderate carotid artery stenosis. She has no history of stroke  or TIA. Today's carotid Duplex suggests less than 40% right internal carotid artery  stenosis and 40 - 59% left internal carotid artery stenosis. Bilateral external carotid artery stenosis.  No significant change since carotid Duplex on 07/18/14.   Plan: Follow-up in 1 year with Carotid Duplex.   I discussed in depth with the patient the nature of atherosclerosis, and emphasized the importance of maximal medical management including strict control of blood pressure, blood glucose, and lipid levels, obtaining regular exercise, and continued cessation of smoking.  The patient is aware that without maximal medical management the underlying atherosclerotic disease process will progress, limiting the benefit of any interventions. The patient was given information about stroke prevention and what symptoms should prompt the patient to seek immediate medical care. Thank you for allowing [place] to participate in this patient's care.  Clemon Chambers, RN, MSN, FNP-C [facility] Office: [phone]  Clinic Physician: Early  07/24/2015 11:02 AM

## 2015-07-24 NOTE — Patient Instructions (Signed)
Stroke Prevention Some medical conditions and behaviors are associated with an increased chance of having a stroke. You may prevent a stroke by making healthy choices and managing medical conditions. HOW CAN I REDUCE MY RISK OF HAVING A STROKE?   Stay physically active. Get at least 30 minutes of activity on most or all days.  Do not smoke. It may also be helpful to avoid exposure to secondhand smoke.  Limit alcohol use. Moderate alcohol use is considered to be:  No more than 2 drinks per day for men.  No more than 1 drink per day for nonpregnant women.  Eat healthy foods. This involves:  Eating 5 or more servings of fruits and vegetables a day.  Making dietary changes that address high blood pressure (hypertension), high cholesterol, diabetes, or obesity.  Manage your cholesterol levels.  Making food choices that are high in fiber and low in saturated fat, trans fat, and cholesterol may control cholesterol levels.  Take any prescribed medicines to control cholesterol as directed by your health care provider.  Manage your diabetes.  Controlling your carbohydrate and sugar intake is recommended to manage diabetes.  Take any prescribed medicines to control diabetes as directed by your health care provider.  Control your hypertension.  Making food choices that are low in salt (sodium), saturated fat, trans fat, and cholesterol is recommended to manage hypertension.  Take any prescribed medicines to control hypertension as directed by your health care provider.  Maintain a healthy weight.  Reducing calorie intake and making food choices that are low in sodium, saturated fat, trans fat, and cholesterol are recommended to manage weight.  Stop drug abuse.  Avoid taking birth control pills.  Talk to your health care provider about the risks of taking birth control pills if you are over 35 years old, smoke, get migraines, or have ever had a blood clot.  Get evaluated for sleep  disorders (sleep apnea).  Talk to your health care provider about getting a sleep evaluation if you snore a lot or have excessive sleepiness.  Take medicines only as directed by your health care provider.  For some people, aspirin or blood thinners (anticoagulants) are helpful in reducing the risk of forming abnormal blood clots that can lead to stroke. If you have the irregular heart rhythm of atrial fibrillation, you should be on a blood thinner unless there is a good reason you cannot take them.  Understand all your medicine instructions.  Make sure that other conditions (such as anemia or atherosclerosis) are addressed. SEEK IMMEDIATE MEDICAL CARE IF:   You have sudden weakness or numbness of the face, arm, or leg, especially on one side of the body.  Your face or eyelid droops to one side.  You have sudden confusion.  You have trouble speaking (aphasia) or understanding.  You have sudden trouble seeing in one or both eyes.  You have sudden trouble walking.  You have dizziness.  You have a loss of balance or coordination.  You have a sudden, severe headache with no known cause.  You have new chest pain or an irregular heartbeat. Any of these symptoms may represent a serious problem that is an emergency. Do not wait to see if the symptoms will go away. Get medical help at once. Call your local emergency services (911 in [place] Do not drive yourself to the hospital. Document Released: 01/15/2005 Document Revised: 04/24/2014 Document Reviewed: 06/10/2013 [employer] Patient Information 2015 [employer]. This information is not intended to replace advice given   to you by your health care provider. Make sure you discuss any questions you have with your health care provider.  

## 2015-07-24 NOTE — Progress Notes (Signed)
Filed Vitals:   07/24/15 1034 07/24/15 1036 07/24/15 1038  BP: 155/61 150/72 145/67  Pulse: 62 63 63  Temp: 97.5 F (36.4 C)    Resp: 14    Height: 5\' 3"  (1.6 m)    Weight: 143 lb (64.864 kg)    SpO2: 98%

## 2015-07-26 DIAGNOSIS — L820 Inflamed seborrheic keratosis: Secondary | ICD-10-CM | POA: Diagnosis not present

## 2015-07-26 DIAGNOSIS — C44622 Squamous cell carcinoma of skin of right upper limb, including shoulder: Secondary | ICD-10-CM | POA: Diagnosis not present

## 2015-07-26 DIAGNOSIS — Z85828 Personal history of other malignant neoplasm of skin: Secondary | ICD-10-CM | POA: Diagnosis not present

## 2015-07-26 DIAGNOSIS — C44311 Basal cell carcinoma of skin of nose: Secondary | ICD-10-CM | POA: Diagnosis not present

## 2015-07-26 DIAGNOSIS — Z08 Encounter for follow-up examination after completed treatment for malignant neoplasm: Secondary | ICD-10-CM | POA: Diagnosis not present

## 2015-08-16 DIAGNOSIS — C44311 Basal cell carcinoma of skin of nose: Secondary | ICD-10-CM | POA: Diagnosis not present

## 2015-08-26 ENCOUNTER — Emergency Department (HOSPITAL_COMMUNITY)
Admission: EM | Admit: 2015-08-26 | Discharge: 2015-08-26 | Disposition: A | Discharge status: Home / Self Care | Payer: Medicare Other | Attending: Emergency Medicine | Admitting: Emergency Medicine

## 2015-08-26 ENCOUNTER — Encounter (HOSPITAL_COMMUNITY): Payer: Self-pay | Admitting: *Deleted

## 2015-08-26 ENCOUNTER — Emergency Department (HOSPITAL_COMMUNITY): Payer: Medicare Other

## 2015-08-26 DIAGNOSIS — Z7951 Long term (current) use of inhaled steroids: Secondary | ICD-10-CM | POA: Diagnosis not present

## 2015-08-26 DIAGNOSIS — Z8719 Personal history of other diseases of the digestive system: Secondary | ICD-10-CM | POA: Diagnosis not present

## 2015-08-26 DIAGNOSIS — Z8709 Personal history of other diseases of the respiratory system: Secondary | ICD-10-CM | POA: Diagnosis not present

## 2015-08-26 DIAGNOSIS — Z8739 Personal history of other diseases of the musculoskeletal system and connective tissue: Secondary | ICD-10-CM | POA: Diagnosis not present

## 2015-08-26 DIAGNOSIS — E785 Hyperlipidemia, unspecified: Secondary | ICD-10-CM | POA: Diagnosis not present

## 2015-08-26 DIAGNOSIS — Z79899 Other long term (current) drug therapy: Secondary | ICD-10-CM | POA: Diagnosis not present

## 2015-08-26 DIAGNOSIS — Z87891 Personal history of nicotine dependence: Secondary | ICD-10-CM | POA: Insufficient documentation

## 2015-08-26 DIAGNOSIS — Z7982 Long term (current) use of aspirin: Secondary | ICD-10-CM | POA: Insufficient documentation

## 2015-08-26 DIAGNOSIS — I10 Essential (primary) hypertension: Secondary | ICD-10-CM | POA: Diagnosis not present

## 2015-08-26 DIAGNOSIS — R42 Dizziness and giddiness: Secondary | ICD-10-CM | POA: Insufficient documentation

## 2015-08-26 LAB — CBG MONITORING, ED: GLUCOSE-CAPILLARY: 85 mg/dL (ref 65–99)

## 2015-08-26 MED ORDER — ONDANSETRON HCL 4 MG PO TABS: 4.0000 mg | ORAL_TABLET | Freq: Three times a day (TID) | ORAL | Status: DC | PRN

## 2015-08-26 MED ORDER — MECLIZINE HCL 12.5 MG PO TABS
25.0000 mg | ORAL_TABLET | Freq: Once | ORAL | Status: AC
  Administered 2015-08-26: 25 mg via ORAL
  Filled 2015-08-26: qty 2

## 2015-08-26 MED ORDER — MECLIZINE HCL 25 MG PO TABS: 25.0000 mg | ORAL_TABLET | Freq: Three times a day (TID) | ORAL | Status: DC | PRN

## 2015-08-26 NOTE — Discharge Instructions (Signed)
CT scan normal - no signs of bleeding / mass   Please obtain all of your results from medical records or have your doctors office obtain the results - share them with your doctor - you should be seen at your doctors office in the next 2 days. Call today to arrange your follow up. Take the medications as prescribed. Please review all of the medicines and only take them if you do not have an allergy to them. Please be aware that if you are taking birth control pills, taking other prescriptions, ESPECIALLY ANTIBIOTICS may make the birth control ineffective - if this is the case, either do not engage in sexual activity or use alternative methods of birth control such as condoms until you have finished the medicine and your family doctor says it is OK to restart them. If you are on a blood thinner such as COUMADIN, be aware that any other medicine that you take may cause the coumadin to either work too much, or not enough - you should have your coumadin level rechecked in next 7 days if this is the case.  ?  It is also a possibility that you have an allergic reaction to any of the medicines that you have been prescribed - Everybody reacts differently to medications and while MOST people have no trouble with most medicines, you may have a reaction such as nausea, vomiting, rash, swelling, shortness of breath. If this is the case, please stop taking the medicine immediately and contact your physician.  ?  You should return to the ER if you develop severe or worsening symptoms.

## 2015-08-26 NOTE — ED Notes (Signed)
CBG 85

## 2015-08-26 NOTE — ED Notes (Signed)
Dr. Sabra Heck at bedside from 1400 to 1410

## 2015-08-26 NOTE — ED Provider Notes (Signed)
CSN: [id number]     Arrival date & time 08/26/15  1342 History   First MD Initiated Contact with Patient 08/26/15 1348     Chief Complaint  Patient presents with  . Dizziness     (Consider location/radiation/quality/duration/timing/severity/associated sxs/prior Treatment) HPI Comments: The patient is a 69 year old female, she has a history of acid reflux, hypertension, hyperlipidemia and has been under the care of a vascular surgeon for moderate vascular disease of her carotid arteries which has not required surgery. She had vascular studies performed approximately one month ago showing less than 60% occlusions bilaterally. She reports several days of intermittent dizziness that she describes as feeling off balance. She states that it is worse when she stands up and worse with movement. She noted that this morning she was unable to get out of bed because of this dizziness but when she lays still in bed she has no symptoms. She denies any associated neurologic complaints including no fevers, chills, nausea, vomiting, weakness, numbness, vision changes, slurred speech, facial droop. She reports having this intermittently in the past but it only lasted several minutes. Today it has lasted throughout the day whenever she tries to move but again it goes away when she lays still. She denies tinnitus or hearing loss but does have a discomfort in her right ear which is poorly described  Patient is a 69 y.o. female presenting with dizziness. The history is provided by the patient.  Dizziness   Past Medical History  Diagnosis Date  . Carotid artery occlusion   . Osteopenia   . Hypertension   . Hyperlipidemia   . GERD (gastroesophageal reflux disease)   . Influenza A 01/01/2014  . Syncope 12/31/2013    Vasovagal or hypovolemia/orthostatic   Past Surgical History  Procedure Laterality Date  . Abdominal hysterectomy  1982    Partial hysterectomy  . Small intestine surgery    . Ct scan  July 2015   Chest   Family History  Problem Relation Age of Onset  . Cancer Mother     pancratic  . Heart attack Mother   . Cancer Father     stomach  . Diabetes Father   . Hyperlipidemia Father   . Heart disease Father     NOT  before age 63  . Cancer Sister     lung  . Hyperlipidemia Sister   . Cancer Brother     thyroid, prostate  . Hyperlipidemia Brother   . Heart attack Brother   . Cancer Sister     colon  . Hyperlipidemia Sister   . Cancer Sister     colon  . Hyperlipidemia Sister    Social History  Substance Use Topics  . Smoking status: Former Smoker    Quit date: 12/22/1996  . Smokeless tobacco: Never Used  . Alcohol Use: No   OB History    No data available     Review of Systems  Neurological: Positive for dizziness.  All other systems reviewed and are negative.     Allergies  Tetanus toxoids  Home Medications   Prior to Admission medications   Medication Sig Start Date End Date Taking? Authorizing Provider  aspirin 325 MG tablet Take 325 mg by mouth at bedtime.    Yes Historical Provider, MD  cholecalciferol (VITAMIN D) 1000 UNITS tablet Take 1,000 Units by mouth daily.   Yes Historical Provider, MD  Cyanocobalamin (VITAMIN B 12 PO) Take 1,000 mg by mouth daily.   Yes Historical Provider, MD  fluticasone (FLONASE) 50 MCG/ACT nasal spray Place 2 sprays into both nostrils as needed. 03/20/14  Yes Susy Frizzle, MD  hydrochlorothiazide (HYDRODIURIL) 25 MG tablet TAKE 1 TABLET BY MOUTH EVERY DAY 05/08/15  Yes Susy Frizzle, MD  losartan (COZAAR) 50 MG tablet TAKE 1 TABLET BY MOUTH EVERY DAY 04/10/15  Yes Susy Frizzle, MD  rosuvastatin (CRESTOR) 20 MG tablet TAKE 1 TABLET BY MOUTH EVERY DAY Patient taking differently: TAKE 1 TABLET BY MOUTH EVERY NIGHT 07/09/15  Yes Susy Frizzle, MD  meclizine (ANTIVERT) 25 MG tablet Take 1 tablet (25 mg total) by mouth 3 (three) times daily as needed for dizziness. 08/26/15   Noemi Chapel, MD  ondansetron (ZOFRAN) 4 MG  tablet Take 1 tablet (4 mg total) by mouth every 8 (eight) hours as needed for nausea or vomiting. 08/26/15   Noemi Chapel, MD   BP 161/58 mmHg  Pulse 64  Resp 16  SpO2 97% Physical Exam  Constitutional: She appears well-developed and well-nourished. No distress.  HENT:  Head: Normocephalic and atraumatic.  Mouth/Throat: Oropharynx is clear and moist. No oropharyngeal exudate.  Tympanic membranes clear bilaterally  Eyes: Conjunctivae and EOM are normal. Pupils are equal, round, and reactive to light. Right eye exhibits no discharge. Left eye exhibits no discharge. No scleral icterus.  Neck: Normal range of motion. Neck supple. No JVD present. No thyromegaly present.  Cardiovascular: Normal rate, regular rhythm, normal heart sounds and intact distal pulses.  Exam reveals no gallop and no friction rub.   No murmur heard. Pulmonary/Chest: Effort normal and breath sounds normal. No respiratory distress. She has no wheezes. She has no rales.  Abdominal: Soft. Bowel sounds are normal. She exhibits no distension and no mass. There is no tenderness.  Musculoskeletal: Normal range of motion. She exhibits no edema or tenderness.  Lymphadenopathy:    She has no cervical adenopathy.  Neurological: She is alert. Coordination normal.  Normal speech, normal cranial nerves III through XII, normal strength in all 4 extremities, normal finger-nose-finger, normal heel shin, no pronator drift, normal sensation in all 4 extremities, the patient is able to stand at the bedside but this does induce the feeling of dizziness for approximately 1 minute as it fatigues and goes away. When the symptoms go away the patient is able to ambulate without any difficulty whatsoever and can walk heel-to-toe without difficulty.  Skin: Skin is warm and dry. No rash noted. No erythema.  Psychiatric: She has a normal mood and affect. Her behavior is normal.  Nursing note and vitals reviewed.   ED Course  Procedures (including  critical care time) Labs Review Labs Reviewed  CBG MONITORING, ED    Imaging Review Ct Head Wo Contrast  08/26/2015   CLINICAL DATA:  69 year old female with dizziness for 3 days, ataxia and nausea for 1 day.  EXAM: CT HEAD WITHOUT CONTRAST  TECHNIQUE: Contiguous axial images were obtained from the base of the skull through the vertex without intravenous contrast.  COMPARISON:  12/31/2013 CT  FINDINGS: No intracranial abnormalities are identified, including mass lesion or mass effect, hydrocephalus, extra-axial fluid collection, midline shift, hemorrhage, or acute infarction.  The visualized bony calvarium is unremarkable.  IMPRESSION: Unremarkable noncontrast head CT.   Electronically Signed   By: Margarette Canada M.D.   On: 08/26/2015 14:49   I have personally reviewed and evaluated these images and lab results as part of my medical decision-making.    MDM   Final diagnoses:  Dizziness  The patient has inducible vertigo which fatigues appropriately as would be expected with a peripheral vertigo. Vital signs are rather unremarkable, due to her history of vascular problems we'll obtain a CT scan of the brain to rule out any posterior abnormalities though I suspect a peripheral source and will give meclizine. We'll also make sure the patient is taking an aspirin and has close follow-up. The patient and family members are in agreement.  meds given, CT neg, VS unremarkable - stable for d/c - will f/u if worsens.  Noemi Chapel, MD 08/26/15 [phone]

## 2015-08-26 NOTE — ED Notes (Signed)
MD at bedside. 

## 2015-08-26 NOTE — ED Notes (Signed)
Pt comes in with dizziness that started 3 days ago. Pt states dizziness is worse with movement, pt nauseous but no vomiting noted. Denies any weakness. Primary nurse at bedside

## 2015-09-10 ENCOUNTER — Other Ambulatory Visit: Payer: Self-pay | Admitting: Family Medicine

## 2015-09-20 DIAGNOSIS — Z08 Encounter for follow-up examination after completed treatment for malignant neoplasm: Secondary | ICD-10-CM | POA: Diagnosis not present

## 2015-09-20 DIAGNOSIS — Z85828 Personal history of other malignant neoplasm of skin: Secondary | ICD-10-CM | POA: Diagnosis not present

## 2015-10-09 ENCOUNTER — Other Ambulatory Visit: Payer: Self-pay | Admitting: Family Medicine

## 2015-10-09 NOTE — Telephone Encounter (Signed)
Refill appropriate and filled per protocol. 

## 2015-11-08 ENCOUNTER — Other Ambulatory Visit: Payer: Self-pay | Admitting: Family Medicine

## 2015-11-08 ENCOUNTER — Encounter: Payer: Self-pay | Admitting: Family Medicine

## 2015-11-08 NOTE — Telephone Encounter (Signed)
Medication refill for one time only.  Patient needs to be seen.  Letter sent for patient to call and schedule 

## 2015-11-08 NOTE — Telephone Encounter (Signed)
90 refills denied until appt made

## 2015-11-23 ENCOUNTER — Encounter: Payer: Self-pay | Admitting: Family Medicine

## 2015-11-23 ENCOUNTER — Ambulatory Visit (INDEPENDENT_AMBULATORY_CARE_PROVIDER_SITE_OTHER): Payer: Medicare Other | Admitting: Family Medicine

## 2015-11-23 VITALS — BP 132/68 | HR 60 | Temp 98.4°F | Resp 14 | Ht 64.0 in | Wt 145.0 lb

## 2015-11-23 DIAGNOSIS — E785 Hyperlipidemia, unspecified: Secondary | ICD-10-CM

## 2015-11-23 DIAGNOSIS — I10 Essential (primary) hypertension: Secondary | ICD-10-CM

## 2015-11-23 DIAGNOSIS — I6523 Occlusion and stenosis of bilateral carotid arteries: Secondary | ICD-10-CM | POA: Diagnosis not present

## 2015-11-23 LAB — COMPLETE METABOLIC PANEL WITH GFR
ALT: 16 U/L (ref 6–29)
AST: 23 U/L (ref 10–35)
Albumin: 4.7 g/dL (ref 3.6–5.1)
Alkaline Phosphatase: 56 U/L (ref 33–130)
BUN: 17 mg/dL (ref 7–25)
CHLORIDE: 102 mmol/L (ref 98–110)
CO2: 27 mmol/L (ref 20–31)
Calcium: 9.4 mg/dL (ref 8.6–10.4)
Creat: 0.62 mg/dL (ref 0.60–0.93)
Glucose, Bld: 93 mg/dL (ref 70–99)
Potassium: 4.3 mmol/L (ref 3.5–5.3)
Sodium: 137 mmol/L (ref 135–146)
Total Bilirubin: 0.5 mg/dL (ref 0.2–1.2)
Total Protein: 6.8 g/dL (ref 6.1–8.1)

## 2015-11-23 LAB — LIPID PANEL
Cholesterol: 150 mg/dL (ref 125–200)
HDL: 67 mg/dL (ref >=46)
LDL CALC: 69 mg/dL (ref <130)
TRIGLYCERIDES: 72 mg/dL (ref <150)
Total CHOL/HDL Ratio: 2.2 Ratio (ref <=5.0)
VLDL: 14 mg/dL (ref <30)

## 2015-11-23 NOTE — Progress Notes (Signed)
Subjective:    Patient ID: Stacey Moody, female    DOB: 1946-11-14, 69 y.o.   MRN: [id number]  HPI She is here for follow up of her HLD and known carotid artery occlusion.  Has 40-59% blockage in L internal carotid artery and <40% blockage in right ICA.   She is currently on Crestor 20 mg by mouth daily for hyperlipidemia. She denies any myalgias or right upper quadrant pain. Her blood pressureswell controlled at 132/68. She denies any chest pain shortness of breath or dyspnea on exertion Past Medical History  Diagnosis Date  . Carotid artery occlusion   . Osteopenia   . Hypertension   . Hyperlipidemia   . GERD (gastroesophageal reflux disease)   . Influenza A 01/01/2014  . Syncope 12/31/2013    Vasovagal or hypovolemia/orthostatic   Past Surgical History  Procedure Laterality Date  . Abdominal hysterectomy  1982    Partial hysterectomy  . Small intestine surgery    . Ct scan  July 2015    Chest   Current Outpatient Prescriptions on File Prior to Visit  Medication Sig Dispense Refill  . aspirin 325 MG tablet Take 325 mg by mouth at bedtime.     . cholecalciferol (VITAMIN D) 1000 UNITS tablet Take 1,000 Units by mouth daily.    . CRESTOR 20 MG tablet TAKE 1 TABLET BY MOUTH EVERY DAY 30 tablet 0  . Cyanocobalamin (VITAMIN B 12 PO) Take 1,000 mg by mouth daily.    . fluticasone (FLONASE) 50 MCG/ACT nasal spray INSTILL 2 SPRAYS IN EACH NOSTRIL DAILY 16 g 11  . hydrochlorothiazide (HYDRODIURIL) 25 MG tablet TAKE 1 TABLET BY MOUTH EVERY DAY 30 tablet 11  . losartan (COZAAR) 50 MG tablet TAKE 1 TABLET BY MOUTH EVERY DAY 30 tablet 0  . meclizine (ANTIVERT) 25 MG tablet Take 1 tablet (25 mg total) by mouth 3 (three) times daily as needed for dizziness. 30 tablet 0  . ondansetron (ZOFRAN) 4 MG tablet Take 1 tablet (4 mg total) by mouth every 8 (eight) hours as needed for nausea or vomiting. 10 tablet 0  . [DISCONTINUED] doxazosin (CARDURA) 4 MG tablet Take 1 tablet (4 mg total) by mouth  daily. (Patient not taking: Reported on 12/29/2014) 30 tablet 3   No current facility-administered medications on file prior to visit.   Allergies  Allergen Reactions  . Tetanus Toxoids Swelling   Social History   Social History  . Marital Status: Widowed    Spouse Name: N/A  . Number of Children: N/A  . Years of Education: N/A   Occupational History  . Not on file.   Social History Main Topics  . Smoking status: Former Smoker    Quit date: 12/22/1996  . Smokeless tobacco: Never Used  . Alcohol Use: No  . Drug Use: No  . Sexual Activity: Not on file   Other Topics Concern  . Not on file   Social History Narrative   Family History  Problem Relation Age of Onset  . Cancer Mother     pancratic  . Heart attack Mother   . Cancer Father     stomach  . Diabetes Father   . Hyperlipidemia Father   . Heart disease Father     NOT  before age 32  . Cancer Sister     lung  . Hyperlipidemia Sister   . Cancer Brother     thyroid, prostate  . Hyperlipidemia Brother   . Heart attack Brother   .  Cancer Sister     colon  . Hyperlipidemia Sister   . Cancer Sister     colon  . Hyperlipidemia Sister       Review of Systems  All other systems reviewed and are negative.      Objective:   Physical Exam  Constitutional: She is oriented to person, place, and time. She appears well-developed and well-nourished. No distress.  HENT:  Head: Normocephalic and atraumatic.  Right Ear: External ear normal.  Left Ear: External ear normal.  Nose: Nose normal.  Mouth/Throat: Oropharynx is clear and moist. No oropharyngeal exudate.  Eyes: Conjunctivae are normal. Pupils are equal, round, and reactive to light. Right eye exhibits no discharge. Left eye exhibits no discharge. No scleral icterus.  Neck: Normal range of motion. Neck supple. No JVD present. No tracheal deviation present. No thyromegaly present.  Cardiovascular: Normal rate, regular rhythm, normal heart sounds and intact  distal pulses.  Exam reveals no gallop and no friction rub.   No murmur heard. Pulmonary/Chest: Effort normal and breath sounds normal. No stridor. No respiratory distress. She has no wheezes. She has no rales. She exhibits no tenderness.  Abdominal: Soft. Bowel sounds are normal. She exhibits no distension and no mass. There is no tenderness. There is no rebound and no guarding.  Musculoskeletal: Normal range of motion. She exhibits no edema or tenderness.  Lymphadenopathy:    She has no cervical adenopathy.  Neurological: She is alert and oriented to person, place, and time. She has normal reflexes. No cranial nerve deficit. She exhibits normal muscle tone. Coordination normal.  Skin: Skin is warm. No rash noted. She is not diaphoretic. No erythema. No pallor.  Psychiatric: She has a normal mood and affect. Her behavior is normal. Judgment and thought content normal.  Vitals reviewed.         Assessment & Plan:  HLD (hyperlipidemia) - Plan: COMPLETE METABOLIC PANEL WITH GFR, Lipid panel  Essential hypertension   blood pressure is excellent. Check a fasting lipid panel. Goal LDL cholesterol is less than 70. Patient received her flu shot today. She is due for mammogram but she will schedule this on her own.

## 2015-11-26 ENCOUNTER — Encounter: Payer: Self-pay | Admitting: Family Medicine

## 2015-12-09 ENCOUNTER — Other Ambulatory Visit: Payer: Self-pay | Admitting: Family Medicine

## 2015-12-10 ENCOUNTER — Other Ambulatory Visit: Payer: Self-pay | Admitting: Family Medicine

## 2015-12-10 DIAGNOSIS — Z1231 Encounter for screening mammogram for malignant neoplasm of breast: Secondary | ICD-10-CM

## 2015-12-14 ENCOUNTER — Ambulatory Visit (HOSPITAL_COMMUNITY)
Admission: RE | Admit: 2015-12-14 | Discharge: 2015-12-14 | Disposition: A | Discharge status: Home / Self Care | Payer: Medicare Other | Source: Ambulatory Visit | Attending: Family Medicine | Admitting: Family Medicine

## 2015-12-14 DIAGNOSIS — Z1231 Encounter for screening mammogram for malignant neoplasm of breast: Secondary | ICD-10-CM | POA: Insufficient documentation

## 2015-12-14 DIAGNOSIS — R922 Inconclusive mammogram: Secondary | ICD-10-CM | POA: Diagnosis not present

## 2015-12-19 ENCOUNTER — Other Ambulatory Visit: Payer: Self-pay | Admitting: Family Medicine

## 2015-12-19 DIAGNOSIS — R928 Other abnormal and inconclusive findings on diagnostic imaging of breast: Secondary | ICD-10-CM

## 2015-12-25 ENCOUNTER — Other Ambulatory Visit: Payer: Self-pay | Admitting: Family Medicine

## 2015-12-25 ENCOUNTER — Ambulatory Visit (HOSPITAL_COMMUNITY)
Admission: RE | Admit: 2015-12-25 | Discharge: 2015-12-25 | Disposition: A | Discharge status: Home / Self Care | Payer: Medicare Other | Source: Ambulatory Visit | Attending: Family Medicine | Admitting: Family Medicine

## 2015-12-25 DIAGNOSIS — R928 Other abnormal and inconclusive findings on diagnostic imaging of breast: Secondary | ICD-10-CM | POA: Insufficient documentation

## 2015-12-25 DIAGNOSIS — N6002 Solitary cyst of left breast: Secondary | ICD-10-CM | POA: Insufficient documentation

## 2016-01-10 ENCOUNTER — Other Ambulatory Visit: Payer: Self-pay | Admitting: Family Medicine

## 2016-01-10 NOTE — Telephone Encounter (Signed)
Refill appropriate and filled per protocol.l 

## 2016-01-12 ENCOUNTER — Emergency Department (HOSPITAL_COMMUNITY): Payer: Medicare Other

## 2016-01-12 ENCOUNTER — Encounter (HOSPITAL_COMMUNITY): Payer: Self-pay

## 2016-01-12 ENCOUNTER — Emergency Department (HOSPITAL_COMMUNITY)
Admission: EM | Admit: 2016-01-12 | Discharge: 2016-01-12 | Disposition: A | Discharge status: Home / Self Care | Payer: Medicare Other | Attending: Emergency Medicine | Admitting: Emergency Medicine

## 2016-01-12 DIAGNOSIS — Z87891 Personal history of nicotine dependence: Secondary | ICD-10-CM | POA: Insufficient documentation

## 2016-01-12 DIAGNOSIS — Z7982 Long term (current) use of aspirin: Secondary | ICD-10-CM | POA: Insufficient documentation

## 2016-01-12 DIAGNOSIS — Z8739 Personal history of other diseases of the musculoskeletal system and connective tissue: Secondary | ICD-10-CM | POA: Diagnosis not present

## 2016-01-12 DIAGNOSIS — R404 Transient alteration of awareness: Secondary | ICD-10-CM | POA: Diagnosis not present

## 2016-01-12 DIAGNOSIS — R112 Nausea with vomiting, unspecified: Secondary | ICD-10-CM | POA: Insufficient documentation

## 2016-01-12 DIAGNOSIS — R42 Dizziness and giddiness: Secondary | ICD-10-CM | POA: Diagnosis not present

## 2016-01-12 DIAGNOSIS — Z8719 Personal history of other diseases of the digestive system: Secondary | ICD-10-CM | POA: Diagnosis not present

## 2016-01-12 DIAGNOSIS — E785 Hyperlipidemia, unspecified: Secondary | ICD-10-CM | POA: Insufficient documentation

## 2016-01-12 DIAGNOSIS — Z8709 Personal history of other diseases of the respiratory system: Secondary | ICD-10-CM | POA: Insufficient documentation

## 2016-01-12 DIAGNOSIS — I10 Essential (primary) hypertension: Secondary | ICD-10-CM | POA: Diagnosis not present

## 2016-01-12 DIAGNOSIS — Z79899 Other long term (current) drug therapy: Secondary | ICD-10-CM | POA: Diagnosis not present

## 2016-01-12 LAB — BASIC METABOLIC PANEL
Anion gap: 8 (ref 5–15)
BUN: 20 mg/dL (ref 6–20)
CALCIUM: 9 mg/dL (ref 8.9–10.3)
CHLORIDE: 106 mmol/L (ref 101–111)
CO2: 26 mmol/L (ref 22–32)
Creatinine, Ser: 0.6 mg/dL (ref 0.44–1.00)
GFR calc Af Amer: >60 mL/min (ref >60)
Glucose, Bld: 147 mg/dL — ABNORMAL HIGH (ref 65–99)
POTASSIUM: 3.4 mmol/L — AB (ref 3.5–5.1)
Sodium: 140 mmol/L (ref 135–145)

## 2016-01-12 LAB — CBC
HCT: 38.3 % (ref 36.0–46.0)
HEMOGLOBIN: 13.1 g/dL (ref 12.0–15.0)
MCH: 32 pg (ref 26.0–34.0)
MCHC: 34.2 g/dL (ref 30.0–36.0)
MCV: 93.4 fL (ref 78.0–100.0)
Platelets: 197 10*3/uL (ref 150–400)
RBC: 4.1 MIL/uL (ref 3.87–5.11)
RDW: 12.3 % (ref 11.5–15.5)
WBC: 9.7 10*3/uL (ref 4.0–10.5)

## 2016-01-12 LAB — URINALYSIS, ROUTINE W REFLEX MICROSCOPIC
BILIRUBIN URINE: NEGATIVE
Glucose, UA: NEGATIVE mg/dL
Hgb urine dipstick: NEGATIVE
Ketones, ur: NEGATIVE mg/dL
Leukocytes, UA: NEGATIVE
NITRITE: NEGATIVE
Protein, ur: NEGATIVE mg/dL
SPECIFIC GRAVITY, URINE: 1.015 (ref 1.005–1.030)
pH: 6 (ref 5.0–8.0)

## 2016-01-12 LAB — TROPONIN I

## 2016-01-12 LAB — CBG MONITORING, ED: GLUCOSE-CAPILLARY: 132 mg/dL — AB (ref 65–99)

## 2016-01-12 MED ORDER — DIAZEPAM 2 MG PO TABS
2.0000 mg | ORAL_TABLET | Freq: Once | ORAL | Status: AC
  Administered 2016-01-12: 2 mg via ORAL
  Filled 2016-01-12: qty 1

## 2016-01-12 MED ORDER — SODIUM CHLORIDE 0.9 % IV BOLUS (SEPSIS): 1000.0000 mL | Freq: Once | INTRAVENOUS | Status: DC

## 2016-01-12 MED ORDER — MECLIZINE HCL 12.5 MG PO TABS
25.0000 mg | ORAL_TABLET | Freq: Once | ORAL | Status: AC
  Administered 2016-01-12: 25 mg via ORAL
  Filled 2016-01-12: qty 2

## 2016-01-12 MED ORDER — ONDANSETRON HCL 4 MG/2ML IJ SOLN
4.0000 mg | Freq: Once | INTRAMUSCULAR | Status: AC
  Administered 2016-01-12: 4 mg via INTRAVENOUS
  Filled 2016-01-12: qty 2

## 2016-01-12 MED ORDER — SODIUM CHLORIDE 0.9 % IV SOLN
1000.0000 mL | Freq: Once | INTRAVENOUS | Status: AC
  Administered 2016-01-12: 1000 mL via INTRAVENOUS

## 2016-01-12 MED ORDER — MECLIZINE HCL 25 MG PO TABS: 25.0000 mg | ORAL_TABLET | Freq: Three times a day (TID) | ORAL | Status: DC | PRN

## 2016-01-12 NOTE — ED Notes (Signed)
Pt reports history of vertigo, states she got up about 0400 to go to the bathroom and got very dizzy, vomited.  Ems gave 4 mg of zofran en route

## 2016-01-12 NOTE — ED Notes (Signed)
Pt states she would normally take some meclizine, but she is out of it

## 2016-01-12 NOTE — ED Notes (Signed)
Pt tolerating fluids with no difficulties.  Pt was ambulated with no difficulties and minor dizziness. Pt verbalizes that she is feeling much better.

## 2016-01-12 NOTE — Discharge Instructions (Signed)
Dizziness Keep yourself hydrated. Take the dizziness medicine as prescribed. Return to the ED if you develop new or worsening symptoms. Dizziness is a common problem. It is a feeling of unsteadiness or light-headedness. You may feel like you are about to faint. Dizziness can lead to injury if you stumble or fall. Anyone can become dizzy, but dizziness is more common in older adults. This condition can be caused by a number of things, including medicines, dehydration, or illness. HOME CARE INSTRUCTIONS Taking these steps may help with your condition: Eating and Drinking  Drink enough fluid to keep your urine clear or pale yellow. This helps to keep you from becoming dehydrated. Try to drink more clear fluids, such as water.  Do not drink alcohol.  Limit your caffeine intake if directed by your health care provider.  Limit your salt intake if directed by your health care provider. Activity  Avoid making quick movements.  Rise slowly from chairs and steady yourself until you feel okay.  In the morning, first sit up on the side of the bed. When you feel okay, stand slowly while you hold onto something until you know that your balance is fine.  Move your legs often if you need to stand in one place for a long time. Tighten and relax your muscles in your legs while you are standing.  Do not drive or operate heavy machinery if you feel dizzy.  Avoid bending down if you feel dizzy. Place items in your home so that they are easy for you to reach without leaning over. Lifestyle  Do not use any tobacco products, including cigarettes, chewing tobacco, or electronic cigarettes. If you need help quitting, ask your health care provider.  Try to reduce your stress level, such as with yoga or meditation. Talk with your health care provider if you need help. General Instructions  Watch your dizziness for any changes.  Take medicines only as directed by your health care provider. Talk with your  health care provider if you think that your dizziness is caused by a medicine that you are taking.  Tell a friend or a family member that you are feeling dizzy. If he or she notices any changes in your behavior, have this person call your health care provider.  Keep all follow-up visits as directed by your health care provider. This is important. SEEK MEDICAL CARE IF:  Your dizziness does not go away.  Your dizziness or light-headedness gets worse.  You feel nauseous.  You have reduced hearing.  You have new symptoms.  You are unsteady on your feet or you feel like the room is spinning. SEEK IMMEDIATE MEDICAL CARE IF:  You vomit or have diarrhea and are unable to eat or drink anything.  You have problems talking, walking, swallowing, or using your arms, hands, or legs.  You feel generally weak.  You are not thinking clearly or you have trouble forming sentences. It may take a friend or family member to notice this.  You have chest pain, abdominal pain, shortness of breath, or sweating.  Your vision changes.  You notice any bleeding.  You have a headache.  You have neck pain or a stiff neck.  You have a fever.   This information is not intended to replace advice given to you by your health care provider. Make sure you discuss any questions you have with your health care provider.   Document Released: 06/03/2001 Document Revised: 04/24/2015 Document Reviewed: 12/04/2014 Elsevier Interactive Patient Education 2016 [employer] ° °

## 2016-01-12 NOTE — ED Notes (Signed)
CT made aware for verbal order to cancel the CT ordered.

## 2016-01-12 NOTE — ED Provider Notes (Signed)
CSN: [id number]     Arrival date & time 01/12/16  [id number] History   First MD Initiated Contact with Patient 01/12/16 [phone]     Chief Complaint  Patient presents with  . Dizziness     (Consider location/radiation/quality/duration/timing/severity/associated sxs/prior Treatment) HPI Comments: Patient with dizziness described as room spinning vertigo that onset about 4:00 when she attempted to go to the bathroom. Associated with 3 episodes of nausea and vomiting. No diarrhea. History of similar episodes in the past attributed to vertigo. Denies any headache or visual change. No focal weakness, numbness or tingling. No chest pain or shortness of breath. No abdominal pain, fever or diarrhea. Patient states she normally would take meclizine but she does not have any. She felt well when she went to bed.  Patient is a 70 y.o. female presenting with dizziness. The history is provided by the patient and the EMS personnel.  Dizziness Associated symptoms: nausea and vomiting   Associated symptoms: no chest pain, no headaches, no shortness of breath and no weakness     Past Medical History  Diagnosis Date  . Carotid artery occlusion   . Osteopenia   . Hypertension   . Hyperlipidemia   . GERD (gastroesophageal reflux disease)   . Influenza A 01/01/2014  . Syncope 12/31/2013    Vasovagal or hypovolemia/orthostatic   Past Surgical History  Procedure Laterality Date  . Abdominal hysterectomy  1982    Partial hysterectomy  . Small intestine surgery    . Ct scan  July 2015    Chest   Family History  Problem Relation Age of Onset  . Cancer Mother     pancratic  . Heart attack Mother   . Cancer Father     stomach  . Diabetes Father   . Hyperlipidemia Father   . Heart disease Father     NOT  before age 8  . Cancer Sister     lung  . Hyperlipidemia Sister   . Cancer Brother     thyroid, prostate  . Hyperlipidemia Brother   . Heart attack Brother   . Cancer Sister     colon  .  Hyperlipidemia Sister   . Cancer Sister     colon  . Hyperlipidemia Sister    Social History  Substance Use Topics  . Smoking status: Former Smoker    Quit date: 12/22/1996  . Smokeless tobacco: Never Used  . Alcohol Use: No   OB History    No data available     Review of Systems  Constitutional: Positive for activity change and appetite change. Negative for fever and fatigue.  HENT: Negative for congestion.   Eyes: Negative for visual disturbance.  Respiratory: Negative for cough, chest tightness and shortness of breath.   Cardiovascular: Negative for chest pain.  Gastrointestinal: Positive for nausea and vomiting. Negative for abdominal pain.  Genitourinary: Negative for dysuria, hematuria, vaginal bleeding and vaginal discharge.  Musculoskeletal: Negative for arthralgias.  Skin: Negative for wound.  Neurological: Positive for dizziness. Negative for weakness, light-headedness and headaches.  A complete 10 system review of systems was obtained and all systems are negative except as noted in the HPI and PMH.      Allergies  Tetanus toxoids  Home Medications   Prior to Admission medications   Medication Sig Start Date End Date Taking? Authorizing Provider  aspirin 325 MG tablet Take 325 mg by mouth at bedtime.    Yes Historical Provider, MD  cholecalciferol (VITAMIN D) 1000 UNITS tablet  Take 1,000 Units by mouth daily.   Yes Historical Provider, MD  Cyanocobalamin (VITAMIN B 12 PO) Take 1,000 mg by mouth daily.   Yes Historical Provider, MD  hydrochlorothiazide (HYDRODIURIL) 25 MG tablet TAKE 1 TABLET BY MOUTH EVERY DAY 09/10/15  Yes Susy Frizzle, MD  losartan (COZAAR) 50 MG tablet TAKE 1 TABLET BY MOUTH EVERY DAY 01/10/16  Yes Susy Frizzle, MD  rosuvastatin (CRESTOR) 20 MG tablet TAKE 1 TABLET BY MOUTH EVERY DAY 01/10/16  Yes Susy Frizzle, MD  fluticasone [facility]) 50 MCG/ACT nasal spray INSTILL 2 SPRAYS IN EACH NOSTRIL DAILY Patient not taking: Reported on  01/12/2016 10/09/15   Susy Frizzle, MD  meclizine (ANTIVERT) 25 MG tablet Take 1 tablet (25 mg total) by mouth 3 (three) times daily as needed for dizziness. 01/12/16   Ezequiel Essex, MD  ondansetron (ZOFRAN) 4 MG tablet Take 1 tablet (4 mg total) by mouth every 8 (eight) hours as needed for nausea or vomiting. Patient not taking: Reported on 01/12/2016 08/26/15   Noemi Chapel, MD   BP 144/54 mmHg  Pulse 68  Temp(Src) 97.5 F (36.4 C) (Oral)  Resp 14  Ht 5\' 3"  (1.6 m)  Wt 140 lb (63.504 kg)  BMI 24.81 kg/m2  SpO2 100% Physical Exam  Constitutional: She is oriented to person, place, and time. She appears well-developed and well-nourished. No distress.  HENT:  Head: Normocephalic and atraumatic.  Mouth/Throat: Oropharynx is clear and moist. No oropharyngeal exudate.  Eyes: Conjunctivae and EOM are normal. Pupils are equal, round, and reactive to light.  Neck: Normal range of motion. Neck supple.  No meningismus.  Cardiovascular: Normal rate, regular rhythm, normal heart sounds and intact distal pulses.   No murmur heard. Pulmonary/Chest: Effort normal and breath sounds normal. No respiratory distress.  Abdominal: Soft. There is no tenderness. There is no rebound and no guarding.  Musculoskeletal: Normal range of motion. She exhibits no edema or tenderness.  Neurological: She is alert and oriented to person, place, and time. No cranial nerve deficit. She exhibits normal muscle tone. Coordination normal.  No ataxia on finger to nose bilaterally. No pronator drift. 5/5 strength throughout. CN 2-12 intact.Equal grip strength. Sensation intact.   No nystagmus, head impulse testing negative, gait not tested Test of skew negative  Skin: Skin is warm.  Psychiatric: She has a normal mood and affect. Her behavior is normal.  Nursing note and vitals reviewed.   ED Course  Procedures (including critical care time) Labs Review Labs Reviewed  BASIC METABOLIC PANEL - Abnormal; Notable for  the following:    Potassium 3.4 (*)    Glucose, Bld 147 (*)    All other components within normal limits  CBG MONITORING, ED - Abnormal; Notable for the following:    Glucose-Capillary 132 (*)    All other components within normal limits  CBC  URINALYSIS, ROUTINE W REFLEX MICROSCOPIC (NOT AT [facility])  TROPONIN I    Imaging Review Ct Head Wo Contrast  01/12/2016  CLINICAL DATA:  Dizziness EXAM: CT HEAD WITHOUT CONTRAST TECHNIQUE: Contiguous axial images were obtained from the base of the skull through the vertex without intravenous contrast. COMPARISON:  08/26/2015 FINDINGS: The bony calvarium is intact. No findings to suggest acute hemorrhage, acute infarction or space-occupying mass lesion are noted. IMPRESSION: No acute abnormality seen. Electronically Signed   By: Inez Catalina M.D.   On: 01/12/2016 09:14   I have personally reviewed and evaluated these images and lab results as part  of my medical decision-making.   EKG Interpretation   Date/Time:  Saturday January 12 2016 07:16:15 EST Ventricular Rate:  60 PR Interval:  169 QRS Duration: 87 QT Interval:  430 QTC Calculation: 430 R Axis:   67 Text Interpretation:  Sinus rhythm No significant change was found  Confirmed by Wyvonnia Dusky  MD, Kimba Lottes ([id number]) on 01/12/2016 7:37:25 AM      MDM   Final diagnoses:  Vertigo   Vertigo with nausea and vomiting. Nonfocal neurological exam. Patient unable to stand at this time. No nystagmus.  Neurological exam is nonfocal. No nystagmus. No ataxia on finger to nose. Patient given meclizine and by mouth Valium. CT head obtained and is stable. MRI is not available.  Patient feels improved after meclizine and Valium. She is able to ambulate and tolerating by mouth. There is no nystagmus there is no ataxia on finger to nose. There is no pronator drift. She is able to ambulate and she is able to heel toe walk without difficulty.  Low suspicion for central cause of her vertigo. We'll treat for  BPPV. She is given meclizine prescription and follow-up with her doctor, return precautions discussed.   Ezequiel Essex, MD 01/12/16 [phone]

## 2016-01-12 NOTE — ED Notes (Signed)
MD at bedside. 

## 2016-03-27 IMAGING — MG MM DIGITAL SCREENING
6 of 9 series · 6 of 25 positions shown · non-contrast
Comparison: Previous exam(s).

CLINICAL DATA: Screening.

EXAM:
DIGITAL SCREENING BILATERAL MAMMOGRAM WITH 3D TOMO WITH CAD

[R MLO (1 of 2)]
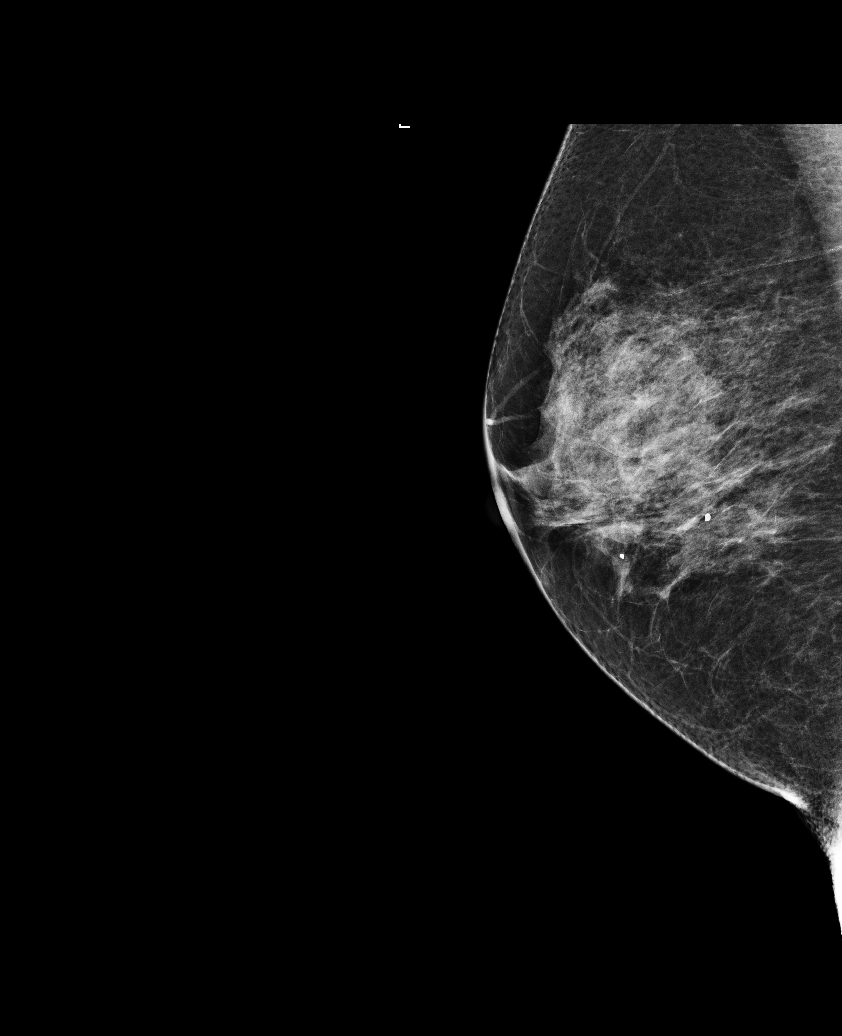

[L MLO]
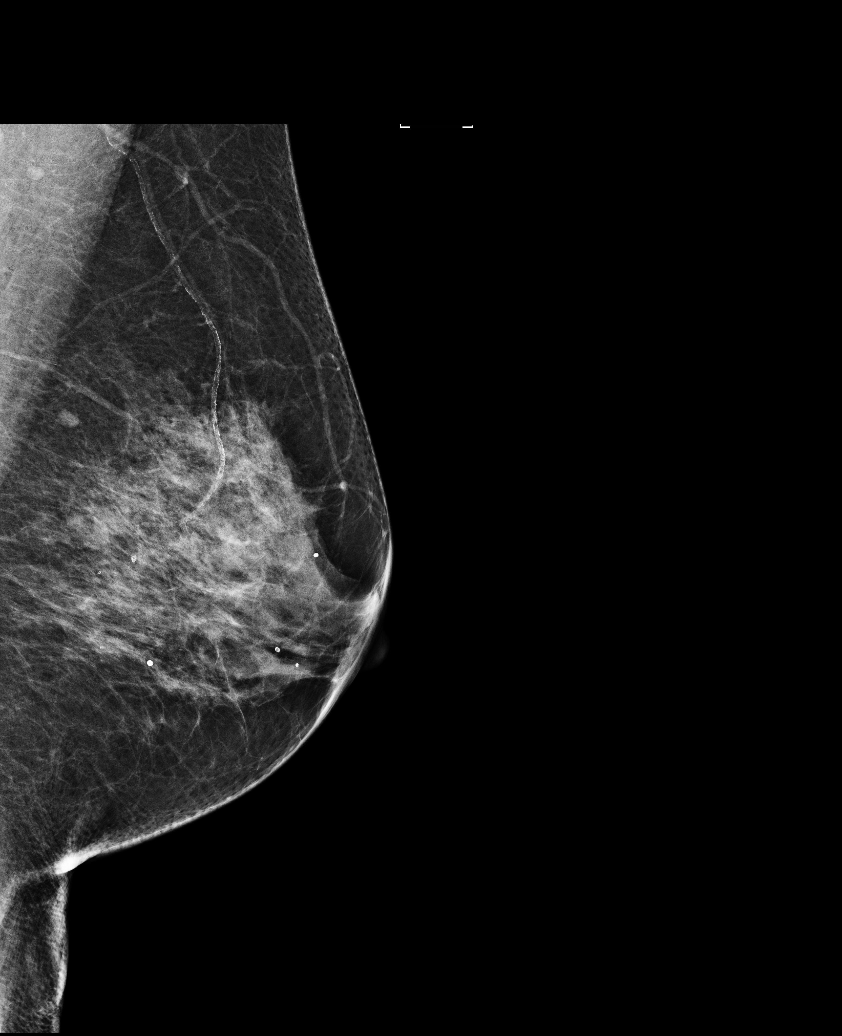

[L CC]
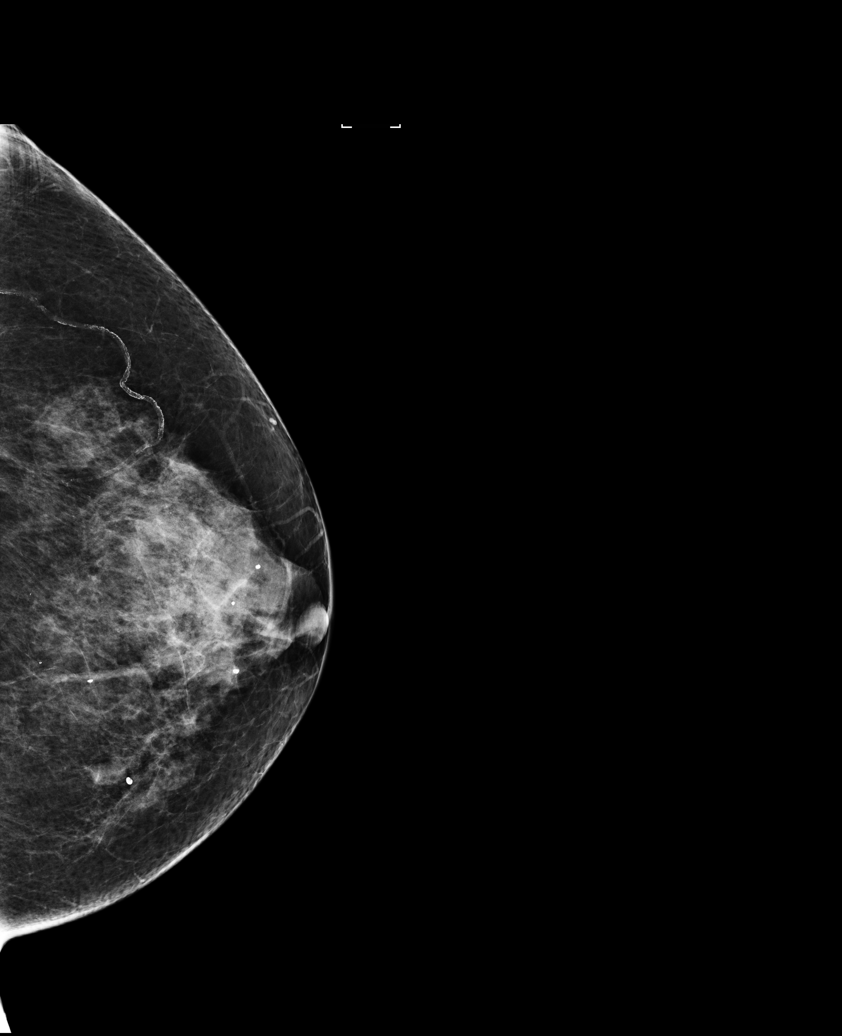

[R CC]
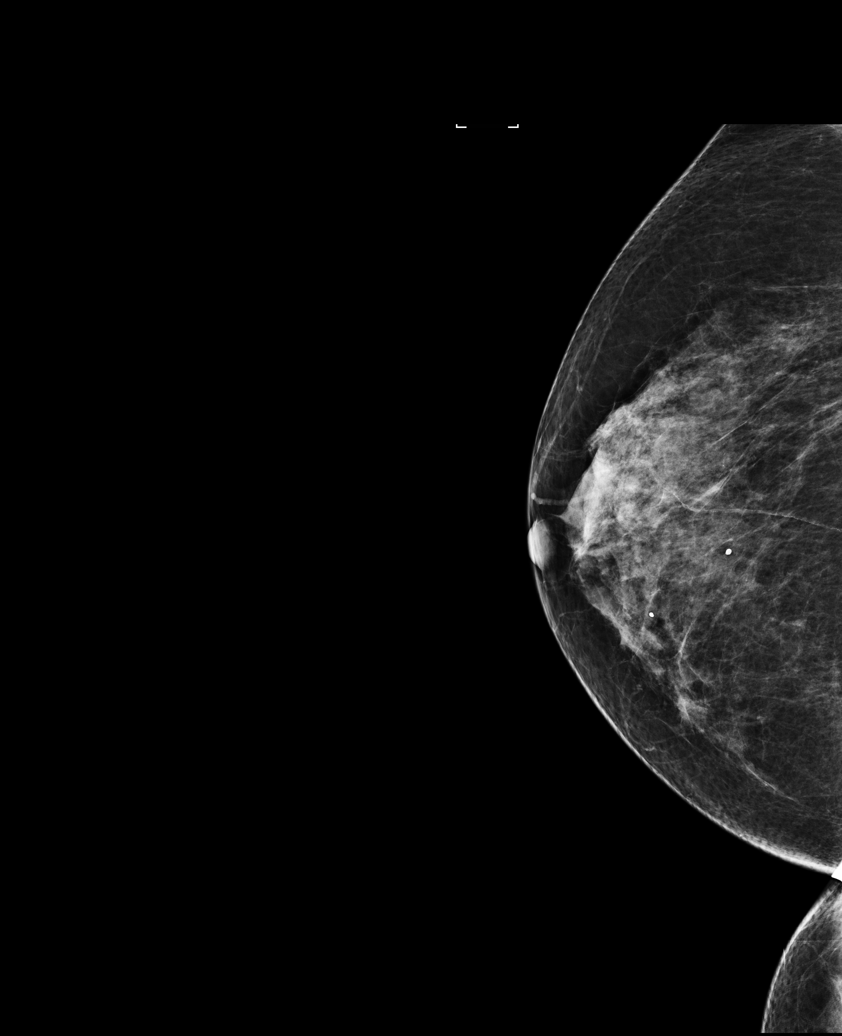

[R MLO (2 of 2)]
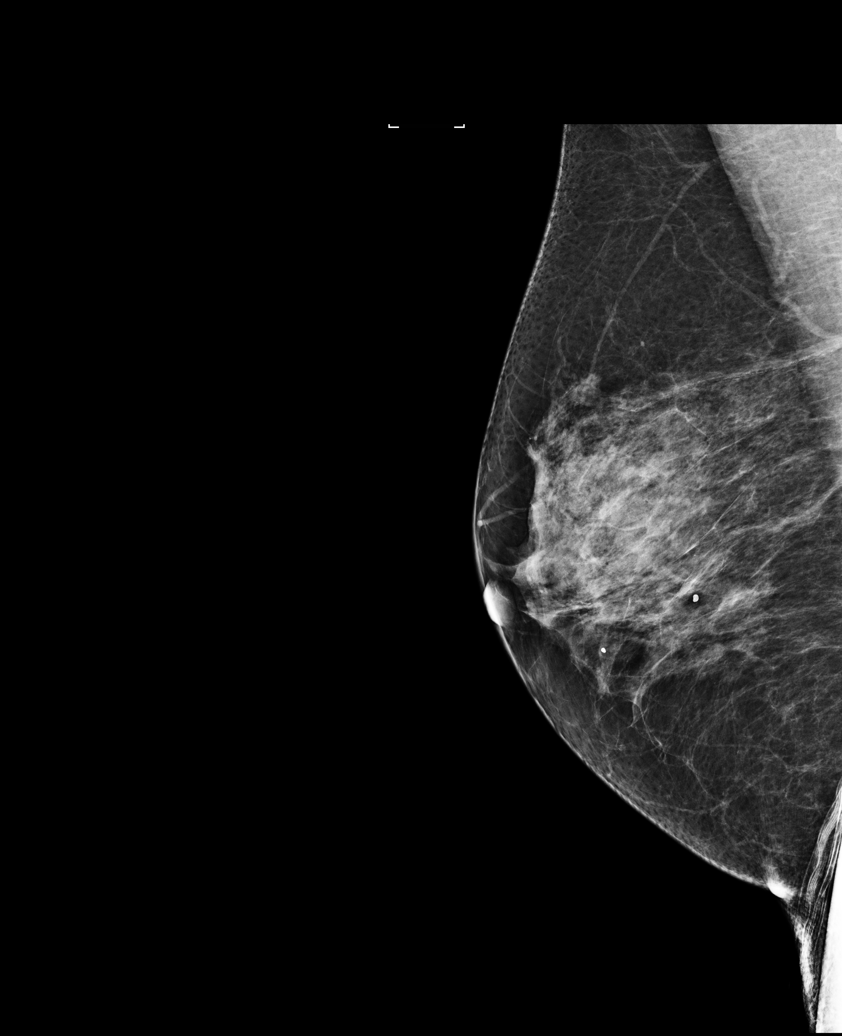

[L MLO tomo · tomo slice 34/67.0]
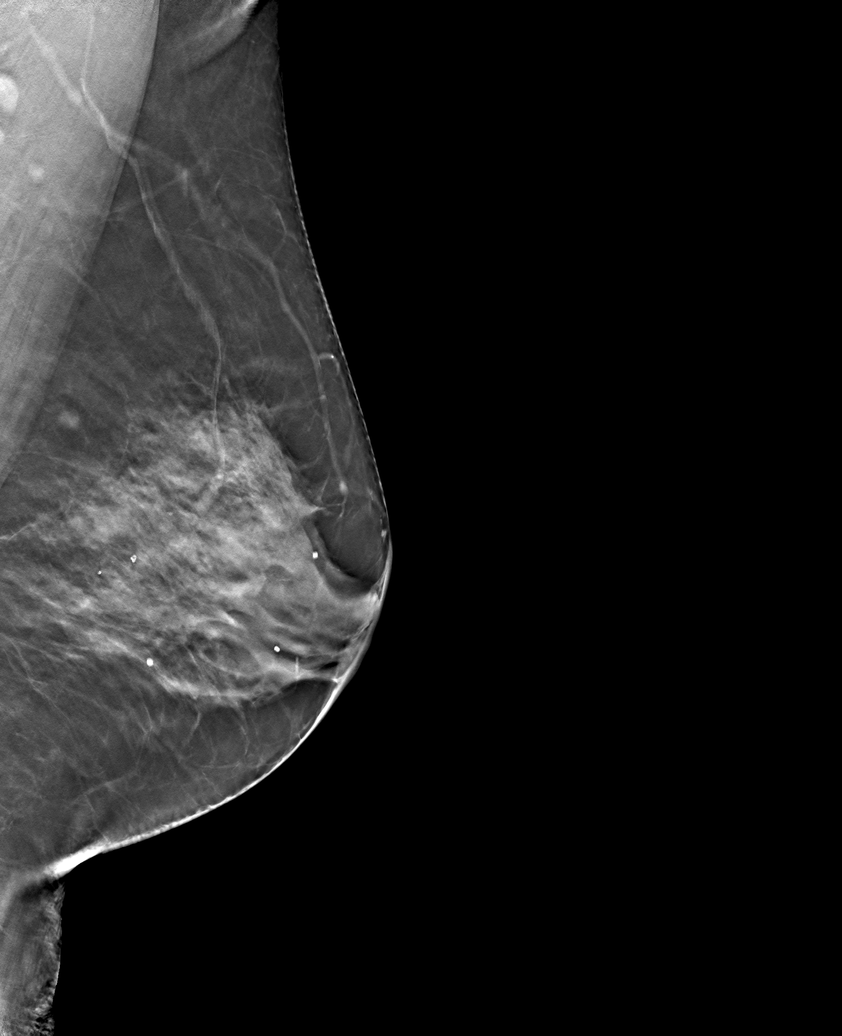

[6 of 25 positions shown; findings below may reference images not displayed]

ACR Breast Density Category c: The breast tissue is heterogeneously
dense, which may obscure small masses.
FINDINGS: In the left breast, a possible mass warrants further evaluation. In
the right breast, no findings suspicious for malignancy.

Images were processed with CAD.
IMPRESSION: Further evaluation is suggested for possible mass in the left
breast.

RECOMMENDATION:
Diagnostic mammogram and possibly ultrasound of the left breast.
(Code:GZ-[id number])

The patient will be contacted regarding the findings, and additional
imaging will be scheduled.

BI-RADS CATEGORY  0: Incomplete. Need additional imaging evaluation
and/or prior mammograms for comparison.

## 2016-05-06 ENCOUNTER — Encounter: Payer: Self-pay | Admitting: Internal Medicine

## 2016-05-11 ENCOUNTER — Other Ambulatory Visit: Payer: Self-pay | Admitting: Family Medicine

## 2016-06-06 ENCOUNTER — Encounter: Payer: Self-pay | Admitting: Family Medicine

## 2016-06-06 ENCOUNTER — Ambulatory Visit (INDEPENDENT_AMBULATORY_CARE_PROVIDER_SITE_OTHER): Payer: Medicare Other | Admitting: Family Medicine

## 2016-06-06 VITALS — BP 162/80 | HR 60 | Temp 98.2°F | Resp 14 | Ht 64.0 in | Wt 148.0 lb

## 2016-06-06 DIAGNOSIS — I10 Essential (primary) hypertension: Secondary | ICD-10-CM | POA: Diagnosis not present

## 2016-06-06 DIAGNOSIS — M8580 Other specified disorders of bone density and structure, unspecified site: Secondary | ICD-10-CM | POA: Diagnosis not present

## 2016-06-06 DIAGNOSIS — E785 Hyperlipidemia, unspecified: Secondary | ICD-10-CM | POA: Diagnosis not present

## 2016-06-06 DIAGNOSIS — Z0000 Encounter for general adult medical examination without abnormal findings: Secondary | ICD-10-CM | POA: Diagnosis not present

## 2016-06-06 LAB — LIPID PANEL
CHOL/HDL RATIO: 2.1 ratio (ref <=5.0)
CHOLESTEROL: 144 mg/dL (ref 125–200)
HDL: 69 mg/dL (ref >=46)
LDL Cholesterol: 58 mg/dL (ref <130)
Triglycerides: 84 mg/dL (ref <150)
VLDL: 17 mg/dL (ref <30)

## 2016-06-06 LAB — COMPLETE METABOLIC PANEL WITH GFR
ALBUMIN: 4.2 g/dL (ref 3.6–5.1)
ALK PHOS: 59 U/L (ref 33–130)
ALT: 19 U/L (ref 6–29)
AST: 22 U/L (ref 10–35)
BILIRUBIN TOTAL: 0.5 mg/dL (ref 0.2–1.2)
BUN: 18 mg/dL (ref 7–25)
CALCIUM: 9 mg/dL (ref 8.6–10.4)
CO2: 26 mmol/L (ref 20–31)
Chloride: 106 mmol/L (ref 98–110)
Creat: 0.63 mg/dL (ref 0.60–0.93)
GFR, Est African American: >89 mL/min (ref >=60)
Glucose, Bld: 102 mg/dL — ABNORMAL HIGH (ref 70–99)
POTASSIUM: 4.1 mmol/L (ref 3.5–5.3)
Sodium: 141 mmol/L (ref 135–146)
Total Protein: 6.5 g/dL (ref 6.1–8.1)

## 2016-06-06 LAB — TSH: TSH: 1.91 mIU/L

## 2016-06-06 NOTE — Progress Notes (Signed)
Subjective:    Patient ID: Stacey Moody, female    DOB: 1946-07-25, 70 y.o.   MRN: [id number]  HPI  Patient is here today for complete physical exam.  Colonoscopy was performed in 2008 and is up-to-date. Past medical history is significant for hysterectomy so that Pap smears are no longer necessary. Immunization History  Administered Date(s) Administered  . Influenza,inj,Quad PF,36+ Mos 12/31/2014  . Pneumococcal Conjugate-13 03/15/2015  . Pneumococcal Polysaccharide-23 04/29/2011  . Td 03/16/2008  . Tdap 03/16/2008    Past Medical History  Diagnosis Date  . Carotid artery occlusion   . Osteopenia   . Hypertension   . Hyperlipidemia   . GERD (gastroesophageal reflux disease)   . Influenza A 01/01/2014  . Syncope 12/31/2013    Vasovagal or hypovolemia/orthostatic   Past Surgical History  Procedure Laterality Date  . Abdominal hysterectomy  1982    Partial hysterectomy  . Small intestine surgery    . Ct scan  July 2015    Chest   Current Outpatient Prescriptions on File Prior to Visit  Medication Sig Dispense Refill  . aspirin 325 MG tablet Take 325 mg by mouth at bedtime.     . cholecalciferol (VITAMIN D) 1000 UNITS tablet Take 1,000 Units by mouth daily.    . Cyanocobalamin (VITAMIN B 12 PO) Take 1,000 mg by mouth daily.    . fluticasone (FLONASE) 50 MCG/ACT nasal spray INSTILL 2 SPRAYS IN EACH NOSTRIL DAILY 16 g 11  . hydrochlorothiazide (HYDRODIURIL) 25 MG tablet TAKE 1 TABLET BY MOUTH EVERY DAY 30 tablet 11  . losartan (COZAAR) 50 MG tablet TAKE 1 TABLET BY MOUTH EVERY DAY 30 tablet 5  . meclizine (ANTIVERT) 25 MG tablet Take 1 tablet (25 mg total) by mouth 3 (three) times daily as needed for dizziness. 30 tablet 0  . ondansetron (ZOFRAN) 4 MG tablet Take 1 tablet (4 mg total) by mouth every 8 (eight) hours as needed for nausea or vomiting. (Patient taking differently: Take 4 mg by mouth every 8 (eight) hours as needed for nausea or vomiting (Vertigo). ) 10 tablet 0  .  rosuvastatin (CRESTOR) 20 MG tablet TAKE 1 TABLET BY MOUTH EVERY DAY 30 tablet 3  . [DISCONTINUED] doxazosin (CARDURA) 4 MG tablet Take 1 tablet (4 mg total) by mouth daily. (Patient not taking: Reported on 12/29/2014) 30 tablet 3   No current facility-administered medications on file prior to visit.   Allergies  Allergen Reactions  . Tetanus Toxoids Swelling   Social History   Social History  . Marital Status: Widowed    Spouse Name: N/A  . Number of Children: N/A  . Years of Education: N/A   Occupational History  . Not on file.   Social History Main Topics  . Smoking status: Former Smoker    Quit date: 12/22/1996  . Smokeless tobacco: Never Used  . Alcohol Use: No  . Drug Use: No  . Sexual Activity: Not on file   Other Topics Concern  . Not on file   Social History Narrative   Family History  Problem Relation Age of Onset  . Cancer Mother     pancratic  . Heart attack Mother   . Cancer Father     stomach  . Diabetes Father   . Hyperlipidemia Father   . Heart disease Father     NOT  before age 4  . Cancer Sister     lung  . Hyperlipidemia Sister   . Cancer Brother  thyroid, prostate  . Hyperlipidemia Brother   . Heart attack Brother   . Cancer Sister     colon  . Hyperlipidemia Sister   . Cancer Sister     colon  . Hyperlipidemia Sister       Review of Systems  All other systems reviewed and are negative.      Objective:   Physical Exam  Constitutional: She is oriented to person, place, and time. She appears well-developed and well-nourished. No distress.  HENT:  Head: Normocephalic and atraumatic.  Right Ear: External ear normal.  Left Ear: External ear normal.  Nose: Nose normal.  Mouth/Throat: Oropharynx is clear and moist. No oropharyngeal exudate.  Eyes: Conjunctivae are normal. Pupils are equal, round, and reactive to light. Right eye exhibits no discharge. Left eye exhibits no discharge. No scleral icterus.  Neck: Normal range of  motion. Neck supple. No JVD present. No tracheal deviation present. No thyromegaly present.  Cardiovascular: Normal rate, regular rhythm, normal heart sounds and intact distal pulses.  Exam reveals no gallop and no friction rub.   No murmur heard. Pulmonary/Chest: Effort normal and breath sounds normal. No stridor. No respiratory distress. She has no wheezes. She has no rales. She exhibits no tenderness.  Abdominal: Soft. Bowel sounds are normal. She exhibits no distension and no mass. There is no tenderness. There is no rebound and no guarding.  Musculoskeletal: Normal range of motion. She exhibits no edema or tenderness.  Lymphadenopathy:    She has no cervical adenopathy.  Neurological: She is alert and oriented to person, place, and time. She has normal reflexes. No cranial nerve deficit. She exhibits normal muscle tone. Coordination normal.  Skin: Skin is warm. No rash noted. She is not diaphoretic. No erythema. No pallor.  Psychiatric: She has a normal mood and affect. Her behavior is normal. Judgment and thought content normal.  Vitals reviewed.         Assessment & Plan:  Routine general medical examination at a health care facility - Plan: COMPLETE METABOLIC PANEL WITH GFR, Lipid panel, TSH, Ambulatory referral to Gastroenterology  HLD (hyperlipidemia) - Plan: COMPLETE METABOLIC PANEL WITH GFR, Lipid panel, TSH  Essential hypertension - Plan: COMPLETE METABOLIC PANEL WITH GFR, Lipid panel, TSH  Osteopenia - Plan: DG Bone Density  Patient's physical exam today is completely normal. Her blood pressure today is elevated however she just had her brother-in-law by last night she believes this could be the cause of it. She will check her blood pressure daily for the next week and notify me of the values. Her goal blood pressure less than 140/90. I will also check a fasting lipid panel. Given the carotid stenosis that she has, her goal LDL cholesterol is less than 70. Hepatitis C test  been performed along with HIV test in the past. Mammogram and breast ultrasound performed in January and up-to-date. She believes her colonoscopy was in 2007 which would make her do this year so I'll schedule her to see GI. Her last bone density test was in 2012 and was significant for osteopenia in the hip with a T score of -2.3. I will schedule her follow-up bone density test. The remainder of her preventative care is up-to-date. Manual breast exam is performed today and is normal. She does have some mild pain in her left breast that is nonspecific. We will monitor this for the next week or so. He just began 1 week ago. Should the pain persists, I would repeat an ultrasound of  the left breast where she was diagnosed with a cyst in January

## 2016-06-07 ENCOUNTER — Other Ambulatory Visit: Payer: Self-pay | Admitting: Family Medicine

## 2016-06-10 ENCOUNTER — Encounter: Payer: Self-pay | Admitting: Family Medicine

## 2016-06-10 NOTE — Telephone Encounter (Signed)
Refill appropriate and filled per protocol. 

## 2016-06-25 ENCOUNTER — Telehealth: Payer: Self-pay | Admitting: Family Medicine

## 2016-06-25 ENCOUNTER — Telehealth: Payer: Self-pay

## 2016-06-25 NOTE — Telephone Encounter (Signed)
Pt has been checking her blood pressure for the past 3 wks per Dr. Samella Parr request. She states that her systolic reading has only been above 140 3x, otherwise it has stayed in the low to mid 130's. [phone]

## 2016-06-25 NOTE — Telephone Encounter (Signed)
Pt left VM that she received a letter to schedule her colonoscopy. I called and she had left the house for awhile and her son-in-law will have her call me back tomorrow afternoon.

## 2016-06-26 NOTE — Telephone Encounter (Signed)
That sounds good.  No changes.

## 2016-06-27 ENCOUNTER — Telehealth: Payer: Self-pay | Admitting: *Deleted

## 2016-06-27 NOTE — Telephone Encounter (Signed)
Left vm notifying pt of Bone density test scheduled for 07/04/16 @ 10:45 at [facility]. **Stop all calcium 2 days prior to test**

## 2016-06-27 NOTE — Telephone Encounter (Signed)
Patient aware of providers recommendations via vm 

## 2016-06-30 ENCOUNTER — Telehealth: Payer: Self-pay

## 2016-07-02 NOTE — Telephone Encounter (Signed)
See separate triage.  

## 2016-07-02 NOTE — Telephone Encounter (Signed)
Gastroenterology Pre-Procedure Review  Request Date: 06/30/2016 Requesting Physician: Dr. Dennard Schaumann  PATIENT REVIEW QUESTIONS: The patient responded to the following health history questions as indicated:    1. Diabetes Melitis: no 2. Joint replacements in the past 12 months: no 3. Major health problems in the past 3 months: no 4. Has an artificial valve or MVP: no 5. Has a defibrillator: no 6. Has been advised in past to take antibiotics in advance of a procedure like teeth cleaning: no 7. Family history of colon cancer: YES    3 sisters in their 53's 8. Alcohol Use: no 9. History of sleep apnea: no     MEDICATIONS & ALLERGIES:    Patient reports the following regarding taking any blood thinners:   Plavix? no Aspirin? YES Coumadin? no  Patient confirms/reports the following medications:  Current Outpatient Prescriptions  Medication Sig Dispense Refill  . aspirin 325 MG tablet Take 325 mg by mouth at bedtime.     . cholecalciferol (VITAMIN D) 1000 UNITS tablet Take 1,000 Units by mouth daily.    . Cyanocobalamin (VITAMIN B 12 PO) Take 1,000 mg by mouth daily.    . hydrochlorothiazide (HYDRODIURIL) 25 MG tablet TAKE 1 TABLET BY MOUTH EVERY DAY 30 tablet 11  . losartan (COZAAR) 50 MG tablet TAKE 1 TABLET BY MOUTH EVERY DAY 30 tablet 5  . meclizine (ANTIVERT) 25 MG tablet Take 1 tablet (25 mg total) by mouth 3 (three) times daily as needed for dizziness. 30 tablet 0  . rosuvastatin (CRESTOR) 20 MG tablet TAKE 1 TABLET BY MOUTH EVERY DAY 30 tablet 0  . fluticasone (FLONASE) 50 MCG/ACT nasal spray INSTILL 2 SPRAYS IN EACH NOSTRIL DAILY (Patient not taking: Reported on 06/30/2016) 16 g 11  . ondansetron (ZOFRAN) 4 MG tablet Take 1 tablet (4 mg total) by mouth every 8 (eight) hours as needed for nausea or vomiting. (Patient not taking: Reported on 06/30/2016) 10 tablet 0  . [DISCONTINUED] doxazosin (CARDURA) 4 MG tablet Take 1 tablet (4 mg total) by mouth daily. (Patient not taking: Reported  on 12/29/2014) 30 tablet 3   No current facility-administered medications for this visit.    Patient confirms/reports the following allergies:  Allergies  Allergen Reactions  . Tetanus Toxoids Swelling    No orders of the defined types were placed in this encounter.    AUTHORIZATION INFORMATION Primary Insurance:   ID #:  Group #:  Pre-Cert / Auth required:  Pre-Cert / Auth #:   Secondary Insurance:   ID #:   Group #:  Pre-Cert / Auth required:  Pre-Cert / Auth #:   SCHEDULE INFORMATION: Procedure has been scheduled as follows:  Date: 07/30/2016       Time:  8:30 AM Location: [facility] Short Stay  This Gastroenterology Pre-Precedure Review Form is being routed to the following provider(s): R. Garfield Cornea, MD

## 2016-07-03 NOTE — Telephone Encounter (Signed)
Ok to schedule. Last TCS 2008 and per record notes appears to have been normal.

## 2016-07-04 ENCOUNTER — Ambulatory Visit (HOSPITAL_COMMUNITY)
Admission: RE | Admit: 2016-07-04 | Discharge: 2016-07-04 | Disposition: A | Discharge status: Home / Self Care | Payer: Medicare Other | Source: Ambulatory Visit | Attending: Family Medicine | Admitting: Family Medicine

## 2016-07-04 DIAGNOSIS — M85852 Other specified disorders of bone density and structure, left thigh: Secondary | ICD-10-CM | POA: Diagnosis not present

## 2016-07-04 DIAGNOSIS — M8588 Other specified disorders of bone density and structure, other site: Secondary | ICD-10-CM | POA: Diagnosis not present

## 2016-07-04 DIAGNOSIS — M8580 Other specified disorders of bone density and structure, unspecified site: Secondary | ICD-10-CM

## 2016-07-04 DIAGNOSIS — M85831 Other specified disorders of bone density and structure, right forearm: Secondary | ICD-10-CM | POA: Insufficient documentation

## 2016-07-07 ENCOUNTER — Encounter: Payer: Self-pay | Admitting: Family Medicine

## 2016-07-07 ENCOUNTER — Other Ambulatory Visit: Payer: Self-pay

## 2016-07-07 DIAGNOSIS — Z1211 Encounter for screening for malignant neoplasm of colon: Secondary | ICD-10-CM

## 2016-07-07 DIAGNOSIS — M8580 Other specified disorders of bone density and structure, unspecified site: Secondary | ICD-10-CM | POA: Insufficient documentation

## 2016-07-07 MED ORDER — NA SULFATE-K SULFATE-MG SULF 17.5-3.13-1.6 GM/177ML PO SOLN: 1.0000 | ORAL | Status: DC

## 2016-07-07 NOTE — Telephone Encounter (Signed)
Rx sent to the pharmacy and instructions mailed to pt.  

## 2016-07-08 ENCOUNTER — Other Ambulatory Visit: Payer: Self-pay | Admitting: Family Medicine

## 2016-07-11 ENCOUNTER — Encounter: Payer: Self-pay | Admitting: Family Medicine

## 2016-07-11 MED ORDER — ALENDRONATE SODIUM 70 MG PO TABS
70.0000 mg | ORAL_TABLET | ORAL | Status: DC
Start: 2016-07-11 — End: 2017-08-04

## 2016-07-11 NOTE — Telephone Encounter (Signed)
Med sent to pharm and pt aware via mychart 

## 2016-07-24 ENCOUNTER — Encounter: Payer: Self-pay | Admitting: Family

## 2016-07-28 ENCOUNTER — Telehealth: Payer: Self-pay

## 2016-07-28 NOTE — Telephone Encounter (Signed)
I called [phone] for [employer] and automated machine said PA  Not required for out patient procedures.

## 2016-07-29 ENCOUNTER — Ambulatory Visit (INDEPENDENT_AMBULATORY_CARE_PROVIDER_SITE_OTHER): Payer: Medicare Other | Admitting: Family

## 2016-07-29 ENCOUNTER — Ambulatory Visit (HOSPITAL_COMMUNITY)
Admission: RE | Admit: 2016-07-29 | Discharge: 2016-07-29 | Disposition: A | Discharge status: Home / Self Care | Payer: Medicare Other | Source: Ambulatory Visit | Attending: Family | Admitting: Family

## 2016-07-29 ENCOUNTER — Encounter (HOSPITAL_COMMUNITY): Payer: Medicare Other

## 2016-07-29 ENCOUNTER — Ambulatory Visit: Payer: Medicare Other | Admitting: Family

## 2016-07-29 ENCOUNTER — Encounter: Payer: Self-pay | Admitting: Family

## 2016-07-29 VITALS — BP 135/65 | HR 59 | Temp 97.7°F | Resp 14 | Ht 63.0 in | Wt 145.0 lb

## 2016-07-29 DIAGNOSIS — E785 Hyperlipidemia, unspecified: Secondary | ICD-10-CM | POA: Insufficient documentation

## 2016-07-29 DIAGNOSIS — Z87891 Personal history of nicotine dependence: Secondary | ICD-10-CM

## 2016-07-29 DIAGNOSIS — K219 Gastro-esophageal reflux disease without esophagitis: Secondary | ICD-10-CM | POA: Diagnosis not present

## 2016-07-29 DIAGNOSIS — I6523 Occlusion and stenosis of bilateral carotid arteries: Secondary | ICD-10-CM | POA: Diagnosis not present

## 2016-07-29 DIAGNOSIS — I10 Essential (primary) hypertension: Secondary | ICD-10-CM | POA: Diagnosis not present

## 2016-07-29 LAB — VAS US CAROTID
LCCADDIAS: -14 cm/s
LCCAPSYS: 99 cm/s
LEFT ECA DIAS: -14 cm/s
Left CCA dist sys: -66 cm/s
Left CCA prox dias: 17 cm/s
Left ICA dist dias: -23 cm/s
Left ICA dist sys: -84 cm/s
Left ICA prox dias: -40 cm/s
Left ICA prox sys: -189 cm/s
RCCADSYS: -105 cm/s
RCCAPDIAS: 17 cm/s
RCCAPSYS: 82 cm/s
RIGHT CCA MID DIAS: 15 cm/s
RIGHT ECA DIAS: -17 cm/s

## 2016-07-29 NOTE — Progress Notes (Signed)
Chief Complaint: Follow up Extracranial Carotid Artery Stenosis   History of Present Illness  Stacey Moody is a 70 y.o. female patient of Dr. Donnetta Hutching who presents today for followup of her known asymptomatic extracranial cerebrovascular occlusive disease. She remains quite active in excellent health with no neurologic deficits.  She has no cardiac disease  She states her blood pressure at home usually runs 140's/60's-70's and always increases in a medical office. She stays physically active, denies ETOH use. She denies claudication symptoms with walking.  Patient has not had previous carotid artery intervention.  The patient denies any history of TIA or stroke symptoms, specifically the patient denies a history of amaurosis fugax or monocular blindness, denies a history unilateral facial drooping, denies a history of hemiplegia, and denies a history of receptive or expressive aphasia.    The patient denies New Medical or Surgical History.  Pt Diabetic: no Pt smoker: former smoker, quit about 1998  Pt meds include: Statin : yes ASA: yes Other anticoagulants/antiplatelets: no   Past Medical History:  Diagnosis Date  . Carotid artery occlusion   . GERD (gastroesophageal reflux disease)   . Hyperlipidemia   . Hypertension   . Influenza A 01/01/2014  . Osteopenia    T-2.3  . Syncope 12/31/2013   Vasovagal or hypovolemia/orthostatic    Social History Social History  Substance Use Topics  . Smoking status: Former Smoker    Quit date: 12/22/1996  . Smokeless tobacco: Never Used  . Alcohol use No    Family History Family History  Problem Relation Age of Onset  . Cancer Mother     pancratic  . Heart attack Mother   . Cancer Father     stomach  . Diabetes Father   . Hyperlipidemia Father   . Heart disease Father     NOT  before age 74  . Cancer Sister     lung  . Hyperlipidemia Sister   . Cancer Brother     thyroid, prostate  . Hyperlipidemia Brother   .  Heart attack Brother   . Cancer Sister     colon  . Hyperlipidemia Sister   . Cancer Sister     colon  . Hyperlipidemia Sister     Surgical History Past Surgical History:  Procedure Laterality Date  . ABDOMINAL HYSTERECTOMY  1982   Partial hysterectomy  . CT SCAN  July 2015   Chest  . SMALL INTESTINE SURGERY      Allergies  Allergen Reactions  . Tetanus Toxoids Swelling    Current Outpatient Prescriptions  Medication Sig Dispense Refill  . alendronate (FOSAMAX) 70 MG tablet Take 1 tablet (70 mg total) by mouth every 7 (seven) days. Take with a full glass of water on an empty stomach. 4 tablet 11  . aspirin 325 MG tablet Take 325 mg by mouth at bedtime.     . cholecalciferol (VITAMIN D) 1000 UNITS tablet Take 1,000 Units by mouth daily.    . Cyanocobalamin (VITAMIN B 12 PO) Take 1,000 mg by mouth daily.    . hydrochlorothiazide (HYDRODIURIL) 25 MG tablet TAKE 1 TABLET BY MOUTH EVERY DAY 30 tablet 11  . losartan (COZAAR) 50 MG tablet TAKE 1 TABLET BY MOUTH EVERY DAY 30 tablet 5  . meclizine (ANTIVERT) 25 MG tablet Take 1 tablet (25 mg total) by mouth 3 (three) times daily as needed for dizziness. 30 tablet 0  . Na Sulfate-K Sulfate-Mg Sulf (SUPREP BOWEL PREP KIT) 17.5-3.13-1.6 GM/180ML SOLN Take  1 kit by mouth as directed. 1 Bottle 0  . ondansetron (ZOFRAN) 4 MG tablet Take 1 tablet (4 mg total) by mouth every 8 (eight) hours as needed for nausea or vomiting. 10 tablet 0  . rosuvastatin (CRESTOR) 20 MG tablet TAKE 1 TABLET BY MOUTH EVERY DAY 30 tablet 3  . fluticasone (FLONASE) 50 MCG/ACT nasal spray INSTILL 2 SPRAYS IN EACH NOSTRIL DAILY (Patient not taking: Reported on 07/29/2016) 16 g 11   No current facility-administered medications for this visit.     Review of Systems : See HPI for pertinent positives and negatives.  Physical Examination  Vitals:   07/29/16 1143 07/29/16 1146  BP: 137/64 135/65  Pulse: (!) 59 (!) 59  Resp: 14   Temp: 97.7 F (36.5 C)   SpO2: 96%    Weight: 145 lb (65.8 kg)   Height: '5\' 3"'$  (1.6 m)    Body mass index is 25.69 kg/m.  General: WDWN female in NAD GAIT: normal Eyes: PERRLA Pulmonary: Respirations are non-labored, CTAB, good air movement in all fields. Cardiac: regular rhythm, no detected murmur.  VASCULAR EXAM Carotid Bruits Right Left   positive Negative    Aorta is not palpable. Radial pulses are 2+ palpable and equal.                                                                                                                                                              LE Pulses Right Left       POPLITEAL  not palpable  not palpable       POSTERIOR TIBIAL  2+ palpable  2+ palpable       DORSALIS PEDIS      ANTERIOR TIBIAL not palpable not palpable    Gastrointestinal: soft, nontender, BS WNL, no r/g, no palpable masses.  Musculoskeletal: No muscle atrophy/wasting. M/S 5/5 throughout, extremities without ischemic changes.  Neurologic: A&O X 3; Appropriate Affect, Speech is normal CN 2-12 intact, pain and light touch intact in extremities, Motor exam as listed above.   Non-Invasive Vascular Imaging CAROTID DUPLEX 07/29/2016   Right ICA: 1 - 39 % stenosis. Left ICA: 40 - 59 % stenosis. Both vertebral arteries are antegrade. Both subclavian arteries have multiphasic waveforms. No significant change since exam on 07/24/15.   Assessment: Stacey Moody is a 70 y.o. female who has a history of mild/moderate carotid artery stenosis. She has no history of stroke or TIA. Today's carotid Duplex suggests less than 40% right internal carotid artery stenosis and 40 - 59% left internal carotid artery stenosis. Bilateral external carotid artery stenosis.  No significant change since carotid Duplex on 07/18/14 and 07/24/15.  Her atherosclerotic risk factors include former smoker (quit in 1988). Fortunately she does not have DM, does not have any known history  of CAD.  She takes a statin and ASA daily.     Plan: Follow-up in 1 year with Carotid Duplex scan.   I discussed in depth with the patient the nature of atherosclerosis, and emphasized the importance of maximal medical management including strict control of blood pressure, blood glucose, and lipid levels, obtaining regular exercise, and continued cessation of smoking.  The patient is aware that without maximal medical management the underlying atherosclerotic disease process will progress, limiting the benefit of any interventions. The patient was given information about stroke prevention and what symptoms should prompt the patient to seek immediate medical care. Thank you for allowing [place] to participate in this patient's care.  Clemon Chambers, RN, MSN, FNP-C [facility] Office: [phone]  Clinic Physician: Early  07/29/16 11:54 AM

## 2016-07-29 NOTE — Patient Instructions (Signed)
Stroke Prevention Some medical conditions and behaviors are associated with an increased chance of having a stroke. You may prevent a stroke by making healthy choices and managing medical conditions. HOW CAN I REDUCE MY RISK OF HAVING A STROKE?   Stay physically active. Get at least 30 minutes of activity on most or all days.  Do not smoke. It may also be helpful to avoid exposure to secondhand smoke.  Limit alcohol use. Moderate alcohol use is considered to be:  No more than 2 drinks per day for men.  No more than 1 drink per day for nonpregnant women.  Eat healthy foods. This involves:  Eating 5 or more servings of fruits and vegetables a day.  Making dietary changes that address high blood pressure (hypertension), high cholesterol, diabetes, or obesity.  Manage your cholesterol levels.  Making food choices that are high in fiber and low in saturated fat, trans fat, and cholesterol may control cholesterol levels.  Take any prescribed medicines to control cholesterol as directed by your health care provider.  Manage your diabetes.  Controlling your carbohydrate and sugar intake is recommended to manage diabetes.  Take any prescribed medicines to control diabetes as directed by your health care provider.  Control your hypertension.  Making food choices that are low in salt (sodium), saturated fat, trans fat, and cholesterol is recommended to manage hypertension.  Ask your health care provider if you need treatment to lower your blood pressure. Take any prescribed medicines to control hypertension as directed by your health care provider.  If you are 18-39 years of age, have your blood pressure checked every 3-5 years. If you are 40 years of age or older, have your blood pressure checked every year.  Maintain a healthy weight.  Reducing calorie intake and making food choices that are low in sodium, saturated fat, trans fat, and cholesterol are recommended to manage  weight.  Stop drug abuse.  Avoid taking birth control pills.  Talk to your health care provider about the risks of taking birth control pills if you are over 35 years old, smoke, get migraines, or have ever had a blood clot.  Get evaluated for sleep disorders (sleep apnea).  Talk to your health care provider about getting a sleep evaluation if you snore a lot or have excessive sleepiness.  Take medicines only as directed by your health care provider.  For some people, aspirin or blood thinners (anticoagulants) are helpful in reducing the risk of forming abnormal blood clots that can lead to stroke. If you have the irregular heart rhythm of atrial fibrillation, you should be on a blood thinner unless there is a good reason you cannot take them.  Understand all your medicine instructions.  Make sure that other conditions (such as anemia or atherosclerosis) are addressed. SEEK IMMEDIATE MEDICAL CARE IF:   You have sudden weakness or numbness of the face, arm, or leg, especially on one side of the body.  Your face or eyelid droops to one side.  You have sudden confusion.  You have trouble speaking (aphasia) or understanding.  You have sudden trouble seeing in one or both eyes.  You have sudden trouble walking.  You have dizziness.  You have a loss of balance or coordination.  You have a sudden, severe headache with no known cause.  You have new chest pain or an irregular heartbeat. Any of these symptoms may represent a serious problem that is an emergency. Do not wait to see if the symptoms will   go away. Get medical help at once. Call your local emergency services (911 in [place] Do not drive yourself to the hospital.   This information is not intended to replace advice given to you by your health care provider. Make sure you discuss any questions you have with your health care provider.   Document Released: 01/15/2005 Document Revised: 12/29/2014 Document Reviewed:  06/10/2013 Elsevier Interactive Patient Education 2016 Elsevier Inc.  

## 2016-07-30 ENCOUNTER — Encounter (HOSPITAL_COMMUNITY): Payer: Self-pay | Admitting: *Deleted

## 2016-07-30 ENCOUNTER — Ambulatory Visit (HOSPITAL_COMMUNITY)
Admission: RE | Admit: 2016-07-30 | Discharge: 2016-07-30 | Disposition: A | Discharge status: Home / Self Care | Payer: Medicare Other | Source: Ambulatory Visit | Attending: Internal Medicine | Admitting: Internal Medicine

## 2016-07-30 ENCOUNTER — Encounter (HOSPITAL_COMMUNITY)
Admission: RE | Disposition: A | Discharge status: Home / Self Care | Payer: Self-pay | Source: Ambulatory Visit | Attending: Internal Medicine

## 2016-07-30 ENCOUNTER — Encounter (HOSPITAL_COMMUNITY): Payer: Self-pay

## 2016-07-30 DIAGNOSIS — M8580 Other specified disorders of bone density and structure, unspecified site: Secondary | ICD-10-CM | POA: Insufficient documentation

## 2016-07-30 DIAGNOSIS — I10 Essential (primary) hypertension: Secondary | ICD-10-CM | POA: Diagnosis not present

## 2016-07-30 DIAGNOSIS — Z800 Family history of malignant neoplasm of digestive organs: Secondary | ICD-10-CM | POA: Diagnosis not present

## 2016-07-30 DIAGNOSIS — Z86010 Personal history of colonic polyps: Secondary | ICD-10-CM | POA: Insufficient documentation

## 2016-07-30 DIAGNOSIS — E785 Hyperlipidemia, unspecified: Secondary | ICD-10-CM | POA: Insufficient documentation

## 2016-07-30 DIAGNOSIS — K5730 Diverticulosis of large intestine without perforation or abscess without bleeding: Secondary | ICD-10-CM | POA: Insufficient documentation

## 2016-07-30 DIAGNOSIS — Z79899 Other long term (current) drug therapy: Secondary | ICD-10-CM | POA: Diagnosis not present

## 2016-07-30 DIAGNOSIS — I6529 Occlusion and stenosis of unspecified carotid artery: Secondary | ICD-10-CM | POA: Diagnosis not present

## 2016-07-30 DIAGNOSIS — Z1211 Encounter for screening for malignant neoplasm of colon: Secondary | ICD-10-CM | POA: Diagnosis not present

## 2016-07-30 DIAGNOSIS — D124 Benign neoplasm of descending colon: Secondary | ICD-10-CM | POA: Insufficient documentation

## 2016-07-30 DIAGNOSIS — K219 Gastro-esophageal reflux disease without esophagitis: Secondary | ICD-10-CM | POA: Insufficient documentation

## 2016-07-30 DIAGNOSIS — Z87891 Personal history of nicotine dependence: Secondary | ICD-10-CM | POA: Diagnosis not present

## 2016-07-30 DIAGNOSIS — Z7982 Long term (current) use of aspirin: Secondary | ICD-10-CM | POA: Insufficient documentation

## 2016-07-30 HISTORY — PX: COLONOSCOPY: SHX5424

## 2016-07-30 HISTORY — PX: POLYPECTOMY: SHX5525

## 2016-07-30 SURGERY — COLONOSCOPY
Anesthesia: Moderate Sedation

## 2016-07-30 MED ORDER — MIDAZOLAM HCL 5 MG/5ML IJ SOLN
INTRAMUSCULAR | Status: AC
  Filled 2016-07-30: qty 10

## 2016-07-30 MED ORDER — MEPERIDINE HCL 100 MG/ML IJ SOLN
INTRAMUSCULAR | Status: DC | PRN
  Administered 2016-07-30 (×2): 25 mg via INTRAVENOUS
  Administered 2016-07-30: 50 mg via INTRAVENOUS

## 2016-07-30 MED ORDER — SIMETHICONE 40 MG/0.6ML PO SUSP
ORAL | Status: DC | PRN
  Administered 2016-07-30: 09:00:00

## 2016-07-30 MED ORDER — SODIUM CHLORIDE 0.9 % IV SOLN
INTRAVENOUS | Status: DC
  Administered 2016-07-30: 1000 mL via INTRAVENOUS

## 2016-07-30 MED ORDER — MEPERIDINE HCL 100 MG/ML IJ SOLN
INTRAMUSCULAR | Status: AC
  Filled 2016-07-30: qty 2

## 2016-07-30 MED ORDER — ONDANSETRON HCL 4 MG/2ML IJ SOLN
INTRAMUSCULAR | Status: AC
  Filled 2016-07-30: qty 2

## 2016-07-30 MED ORDER — MIDAZOLAM HCL 5 MG/5ML IJ SOLN
INTRAMUSCULAR | Status: DC | PRN
  Administered 2016-07-30: 1 mg via INTRAVENOUS
  Administered 2016-07-30: 2 mg via INTRAVENOUS
  Administered 2016-07-30: 1 mg via INTRAVENOUS
  Administered 2016-07-30: 2 mg via INTRAVENOUS
  Administered 2016-07-30: 1 mg via INTRAVENOUS

## 2016-07-30 MED ORDER — ONDANSETRON HCL 4 MG/2ML IJ SOLN
INTRAMUSCULAR | Status: DC | PRN
  Administered 2016-07-30: 4 mg via INTRAVENOUS

## 2016-07-30 NOTE — H&P (Signed)
_0 @   Primary Care Physician:  Odette Fraction, MD Primary Gastroenterologist:  Dr. Gala Romney  Pre-Procedure History & Physical: HPI:  Stacey Moody is a 70 y.o. female is here for a screening colonoscopy. Review of records notes that she had an adenomatous polyp removed from her colon in 2008. Has not returned until now. She notes positive family history of colon cancer and 3 siblings.  No bowel symptoms currently.  Past Medical History:  Diagnosis Date  . Carotid artery occlusion   . GERD (gastroesophageal reflux disease)   . Hyperlipidemia   . Hypertension   . Influenza A 01/01/2014  . Osteopenia    T-2.3  . Syncope 12/31/2013   Vasovagal or hypovolemia/orthostatic    Past Surgical History:  Procedure Laterality Date  . ABDOMINAL HYSTERECTOMY  1982   Partial hysterectomy  . CT SCAN  July 2015   Chest  . SMALL INTESTINE SURGERY      Prior to Admission medications   Medication Sig Start Date End Date Taking? Authorizing Provider  alendronate (FOSAMAX) 70 MG tablet Take 1 tablet (70 mg total) by mouth every 7 (seven) days. Take with a full glass of water on an empty stomach. 07/11/16  Yes Susy Frizzle, MD  cholecalciferol (VITAMIN D) 1000 UNITS tablet Take 1,000 Units by mouth daily.   Yes Historical Provider, MD  Cyanocobalamin (VITAMIN B 12 PO) Take 1,000 mg by mouth daily.   Yes Historical Provider, MD  hydrochlorothiazide (HYDRODIURIL) 25 MG tablet TAKE 1 TABLET BY MOUTH EVERY DAY 09/10/15  Yes Susy Frizzle, MD  losartan (COZAAR) 50 MG tablet TAKE 1 TABLET BY MOUTH EVERY DAY 05/12/16  Yes Susy Frizzle, MD  Na Sulfate-K Sulfate-Mg Sulf (SUPREP BOWEL PREP KIT) 17.5-3.13-1.6 GM/180ML SOLN Take 1 kit by mouth as directed. 07/07/16  Yes Daneil Dolin, MD  rosuvastatin (CRESTOR) 20 MG tablet TAKE 1 TABLET BY MOUTH EVERY DAY 07/08/16  Yes Susy Frizzle, MD  aspirin 325 MG tablet Take 325 mg by mouth at bedtime.     Historical Provider, MD  fluticasone (FLONASE) 50  MCG/ACT nasal spray INSTILL 2 SPRAYS IN EACH NOSTRIL DAILY Patient not taking: Reported on 07/29/2016 10/09/15   Susy Frizzle, MD  meclizine (ANTIVERT) 25 MG tablet Take 1 tablet (25 mg total) by mouth 3 (three) times daily as needed for dizziness. 01/12/16   Ezequiel Essex, MD  ondansetron (ZOFRAN) 4 MG tablet Take 1 tablet (4 mg total) by mouth every 8 (eight) hours as needed for nausea or vomiting. 08/26/15   Noemi Chapel, MD    Allergies as of 07/07/2016 - Review Complete 06/30/2016  Allergen Reaction Noted  . Tetanus toxoids Swelling 05/07/2011    Family History  Problem Relation Age of Onset  . Cancer Mother     pancratic  . Heart attack Mother   . Cancer Father     stomach  . Diabetes Father   . Hyperlipidemia Father   . Heart disease Father     NOT  before age 102  . Cancer Sister     lung  . Hyperlipidemia Sister   . Cancer Brother     thyroid, prostate  . Hyperlipidemia Brother   . Heart attack Brother   . Cancer Sister     colon  . Hyperlipidemia Sister   . Cancer Sister     colon  . Hyperlipidemia Sister     Social History   Social History  . Marital status: Widowed  Spouse name: N/A  . Number of children: N/A  . Years of education: N/A   Occupational History  . Not on file.   Social History Main Topics  . Smoking status: Former Smoker    Quit date: 12/22/1996  . Smokeless tobacco: Never Used  . Alcohol use No  . Drug use: No  . Sexual activity: Not on file   Other Topics Concern  . Not on file   Social History Narrative  . No narrative on file    Review of Systems: See HPI, otherwise negative ROS  Physical Exam: BP (!) 144/44   Pulse 62   Temp 97.7 F (36.5 C) (Oral)   Resp 18   Ht _0  (1.6 m)   Wt 145 lb (65.8 kg)   SpO2 100%   BMI 25.69 kg/m  General:   Alert,  Well-developed, well-nourished, pleasant and cooperative in NAD Neck:  Supple; no masses or thyromegaly. Lungs:  Clear throughout to auscultation.   No wheezes,  crackles, or rhonchi. No acute distress. Heart:  Regular rate and rhythm; no murmurs, clicks, rubs,  or gallops. Abdomen:  Soft, nontender and nondistended. No masses, hepatosplenomegaly or hernias noted. Normal bowel sounds, without guarding, and without rebound.    Impression/Plan: [name] is now here to undergo a surveillance colonoscopy.  Risks, benefits, limitations, imponderables and alternatives regarding colonoscopy have been reviewed with the patient. Questions have been answered. All parties agreeable.     Notice:  This dictation was prepared with Dragon dictation along with smaller phrase technology. Any transcriptional errors that result from this process are unintentional and may not be corrected upon review.

## 2016-07-30 NOTE — Op Note (Signed)
[facility] Patient Name: Stacey Moody Procedure Date: 07/30/2016 8:10 AM MRN: [id number] Date of Birth: January 30, 1946 Attending MD: Norvel Richards , MD CSN: [id number] Age: 70 Admit Type: Outpatient Procedure:                Colonoscopy with snare polypectomy Indications:              High risk colon cancer surveillance: Personal                            history of colonic polyps Providers:                Norvel Richards, MD, Otis Peak B. Sharon Seller, RN,                            Randa Spike, Technician Referring MD:             Cammie Mcgee. [place] Medicines:                Midazolam 7 mg IV, Meperidine 100 mg IV,                            Ondansetron 4 mg IV Complications:            No immediate complications. Estimated Blood Loss:     Estimated blood loss: none. Procedure:                Pre-Anesthesia Assessment:                           - Prior to the procedure, a History and Physical                            was performed, and patient medications and                            allergies were reviewed. The patient's tolerance of                            previous anesthesia was also reviewed. The risks                            and benefits of the procedure and the sedation                            options and risks were discussed with the patient.                            All questions were answered, and informed consent                            was obtained. Anticoagulants: The patient has taken                            anticoagulant medication. It was decided not to  withhold this medication prior to procedure. ASA                            Grade Assessment: II - A patient with mild systemic                            disease. After reviewing the risks and benefits,                            the patient was deemed in satisfactory condition to                            undergo the procedure.                           After  obtaining informed consent, the colonoscope                            was passed under direct vision. Throughout the                            procedure, the patient's blood pressure, pulse, and                            oxygen saturations were monitored continuously. The                            [id number] scope was introduced through                            the anus and advanced to the the cecum, identified                            by appendiceal orifice and ileocecal valve. The                            colonoscopy was somewhat difficult due to                            restricted mobility of the colon. The patient                            tolerated the procedure well. The quality of the                            bowel preparation was adequate. The ileocecal                            valve, appendiceal orifice, and rectum were                            photographed. The entire colon was well visualized. Scope In: 9:01:36 AM Scope Out: 9:40:35 AM Scope Withdrawal Time: 0 hours 12 minutes 3 seconds  Total  Procedure Duration: 0 hours 38 minutes 59 seconds  Findings:      The perianal and digital rectal examinations were normal.      A 8 mm polyp was found in the descending colon. The polyp was       semi-pedunculated. The polyp was removed with a hot snare. Resection and       retrieval were complete. Estimated blood loss: none.      Multiple small and large-mouthed diverticula were found in the sigmoid       colon. The left colon was non-compliant. This required a number       approaches including changing out to the pediatric colonoscope, external       abdominal pressur and changing of the patient's position to reach the       cecum. Impression:               - One 8 mm polyp in the descending colon, removed                            with a hot snare. Resected and retrieved.                           - Diverticulosis in the sigmoid colon. Moderate  Sedation:      Moderate (conscious) sedation was administered by the endoscopy nurse       and supervised by the endoscopist. The following parameters were       monitored: oxygen saturation, heart rate, blood pressure, respiratory       rate, EKG, adequacy of pulmonary ventilation, and response to care.       Total physician intraservice time was 49 minutes. Recommendation:           - Patient has a contact number available for                            emergencies. The signs and symptoms of potential                            delayed complications were discussed with the                            patient. Return to normal activities tomorrow.                            Written discharge instructions were provided to the                            patient.                           - Advance diet as tolerated today.                           - Continue present medications.                           - Repeat colonoscopy date to be determined after  pending pathology results are reviewed for                            surveillance based on pathology results.                           - Return to GI clinic (date not yet determined). Procedure Code(s):        --- Professional ---                           [phone], Colonoscopy, flexible; with removal of                            tumor(s), polyp(s), or other lesion(s) by snare                            technique                           99152, Moderate sedation services provided by the                            same physician or other qualified health care                            professional performing the diagnostic or                            therapeutic service that the sedation supports,                            requiring the presence of an independent trained                            observer to assist in the monitoring of the                            patient's level of consciousness and  physiological                            status; initial 15 minutes of intraservice time,                            patient age 71 years or older                           [phone], Moderate sedation services; each additional                            15 minutes intraservice time                           99153, Moderate sedation services; each additional  15 minutes intraservice time Diagnosis Code(s):        --- Professional ---                           Z86.010, Personal history of colonic polyps                           D12.4, Benign neoplasm of descending colon                           K57.30, Diverticulosis of large intestine without                            perforation or abscess without bleeding CPT copyright 2016 American Medical Association. All rights reserved. The codes documented in this report are preliminary and upon coder review may  be revised to meet current compliance requirements. Cristopher Estimable. Reon Hunley, MD Norvel Richards, MD 07/30/2016 10:01:30 AM This report has been signed electronically. Number of Addenda: 0

## 2016-07-30 NOTE — Discharge Instructions (Signed)
°Colonoscopy °Discharge Instructions ° °Read the instructions outlined below and refer to this sheet in the next few weeks. These discharge instructions provide you with general information on caring for yourself after you leave the hospital. Your doctor may also give you specific instructions. While your treatment has been planned according to the most current medical practices available, unavoidable complications occasionally occur. If you have any problems or questions after discharge, call Dr. Rourk at [phone]. °ACTIVITY °· You may resume your regular activity, but move at a slower pace for the next 24 hours.  °· Take frequent rest periods for the next 24 hours.  °· Walking will help get rid of the air and reduce the bloated feeling in your belly (abdomen).  °· No driving for 24 hours (because of the medicine (anesthesia) used during the test).   °· Do not sign any important legal documents or operate any machinery for 24 hours (because of the anesthesia used during the test).  °NUTRITION °· Drink plenty of fluids.  °· You may resume your normal diet as instructed by your doctor.  °· Begin with a light meal and progress to your normal diet. Heavy or fried foods are harder to digest and may make you feel sick to your stomach (nauseated).  °· Avoid alcoholic beverages for 24 hours or as instructed.  °MEDICATIONS °· You may resume your normal medications unless your doctor tells you otherwise.  °WHAT YOU CAN EXPECT TODAY °· Some feelings of bloating in the abdomen.  °· Passage of more gas than usual.  °· Spotting of blood in your stool or on the toilet paper.  °IF YOU HAD POLYPS REMOVED DURING THE COLONOSCOPY: °· No aspirin products for 7 days or as instructed.  °· No alcohol for 7 days or as instructed.  °· Eat a soft diet for the next 24 hours.  °FINDING OUT THE RESULTS OF YOUR TEST °Not all test results are available during your visit. If your test results are not back during the visit, make an appointment  with your caregiver to find out the results. Do not assume everything is normal if you have not heard from your caregiver or the medical facility. It is important for you to follow up on all of your test results.  °SEEK IMMEDIATE MEDICAL ATTENTION IF: °· You have more than a spotting of blood in your stool.  °· Your belly is swollen (abdominal distention).  °· You are nauseated or vomiting.  °· You have a temperature over 101.  °· You have abdominal pain or discomfort that is severe or gets worse throughout the day.  ° ° ° °Colon polyp and diverticulosis information provided ° °Further recommendations to follow pending review of pathology report ° ° ° ° ° °                                                                                                                     Colon Polyps °Polyps are lumps of extra tissue growing inside the   body. Polyps can grow in the large intestine (colon). Most colon polyps are noncancerous (benign). However, some colon polyps can become cancerous over time. Polyps that are larger than a pea may be harmful. To be safe, caregivers remove and test all polyps. °CAUSES  °Polyps form when mutations in the genes cause your cells to grow and divide even though no more tissue is needed. °RISK FACTORS °There are a number of risk factors that can increase your chances of getting colon polyps. They include: °· Being older than 50 years. °· Family history of colon polyps or colon cancer. °· Long-term colon diseases, such as colitis or Crohn disease. °· Being overweight. °· Smoking. °· Being inactive. °· Drinking too much alcohol. °SYMPTOMS  °Most small polyps do not cause symptoms. If symptoms are present, they may include: °· Blood in the stool. The stool may look dark red or black. °· Constipation or diarrhea that lasts longer than 1 week. °DIAGNOSIS °People often do not know they have polyps until their caregiver finds them during a regular checkup. Your caregiver can use 4 tests to check for  polyps: °· Digital rectal exam. The caregiver wears gloves and feels inside the rectum. This test would find polyps only in the rectum. °· Barium enema. The caregiver puts a liquid called barium into your rectum before taking X-rays of your colon. Barium makes your colon look white. Polyps are dark, so they are easy to see in the X-ray pictures. °· Sigmoidoscopy. A thin, flexible tube (sigmoidoscope) is placed into your rectum. The sigmoidoscope has a light and tiny camera in it. The caregiver uses the sigmoidoscope to look at the last third of your colon. °· Colonoscopy. This test is like sigmoidoscopy, but the caregiver looks at the entire colon. This is the most common method for finding and removing polyps. °TREATMENT  °Any polyps will be removed during a sigmoidoscopy or colonoscopy. The polyps are then tested for cancer. °PREVENTION  °To help lower your risk of getting more colon polyps: °· Eat plenty of fruits and vegetables. Avoid eating fatty foods. °· Do not smoke. °· Avoid drinking alcohol. °· Exercise every day. °· Lose weight if recommended by your caregiver. °· Eat plenty of calcium and folate. Foods that are rich in calcium include milk, cheese, and broccoli. Foods that are rich in folate include chickpeas, kidney beans, and spinach. °HOME CARE INSTRUCTIONS °Keep all follow-up appointments as directed by your caregiver. You may need periodic exams to check for polyps. °SEEK MEDICAL CARE IF: °You notice bleeding during a bowel movement. °  °This information is not intended to replace advice given to you by your health care provider. Make sure you discuss any questions you have with your health care provider. °  °Document Released: 09/03/2004 Document Revised: 12/29/2014 Document Reviewed: 02/17/2012 °Elsevier Interactive Patient Education ©2016 [employer] ° ° ° ° ° ° ° °Diverticulosis °Diverticulosis is the condition that develops when small pouches (diverticula) form in the wall of your colon. Your  colon, or large intestine, is where water is absorbed and stool is formed. The pouches form when the inside layer of your colon pushes through weak spots in the outer layers of your colon. °CAUSES  °No one knows exactly what causes diverticulosis. °RISK FACTORS °· Being older than 50. Your risk for this condition increases with age. Diverticulosis is rare in people younger than 40 years. By age 80, almost everyone has it. °· Eating a low-fiber diet. °· Being frequently constipated. °· Being overweight. °·   Not getting enough exercise. °· Smoking. °· Taking over-the-counter pain medicines, like aspirin and ibuprofen. °SYMPTOMS  °Most people with diverticulosis do not have symptoms. °DIAGNOSIS  °Because diverticulosis often has no symptoms, health care providers often discover the condition during an exam for other colon problems. In many cases, a health care provider will diagnose diverticulosis while using a flexible scope to examine the colon (colonoscopy). °TREATMENT  °If you have never developed an infection related to diverticulosis, you may not need treatment. If you have had an infection before, treatment may include: °· Eating more fruits, vegetables, and grains. °· Taking a fiber supplement. °· Taking a live bacteria supplement (probiotic). °· Taking medicine to relax your colon. °HOME CARE INSTRUCTIONS  °· Drink at least 6-8 glasses of water each day to prevent constipation. °· Try not to strain when you have a bowel movement. °· Keep all follow-up appointments. °If you have had an infection before:  °· Increase the fiber in your diet as directed by your health care provider or dietitian. °· Take a dietary fiber supplement if your health care provider approves. °· Only take medicines as directed by your health care provider. °SEEK MEDICAL CARE IF:  °· You have abdominal pain. °· You have bloating. °· You have cramps. °· You have not gone to the bathroom in 3 days. °SEEK IMMEDIATE MEDICAL CARE IF:  °· Your  pain gets worse. °· Your bloating becomes very bad. °· You have a fever or chills, and your symptoms suddenly get worse. °· You begin vomiting. °· You have bowel movements that are bloody or black. °MAKE SURE YOU: °· Understand these instructions. °· Will watch your condition. °· Will get help right away if you are not doing well or get worse. °  °This information is not intended to replace advice given to you by your health care provider. Make sure you discuss any questions you have with your health care provider. °  °Document Released: 09/04/2004 Document Revised: 12/13/2013 Document Reviewed: 11/02/2013 °Elsevier Interactive Patient Education ©2016 [employer] Inc. ° ° °

## 2016-08-04 ENCOUNTER — Encounter (HOSPITAL_COMMUNITY): Payer: Self-pay | Admitting: Internal Medicine

## 2016-08-05 ENCOUNTER — Encounter: Payer: Self-pay | Admitting: Internal Medicine

## 2016-08-13 NOTE — Addendum Note (Signed)
Addended by: Mena Goes on: 08/13/2016 04:14 PM   Modules accepted: Orders

## 2016-08-19 ENCOUNTER — Other Ambulatory Visit: Payer: Self-pay

## 2016-10-10 ENCOUNTER — Ambulatory Visit: Payer: Medicare Other

## 2016-10-10 DIAGNOSIS — Z23 Encounter for immunization: Secondary | ICD-10-CM | POA: Diagnosis not present

## 2016-11-03 ENCOUNTER — Other Ambulatory Visit: Payer: Self-pay | Admitting: Family Medicine

## 2016-11-07 ENCOUNTER — Other Ambulatory Visit: Payer: Self-pay | Admitting: Family Medicine

## 2016-11-08 ENCOUNTER — Other Ambulatory Visit: Payer: Self-pay | Admitting: Family Medicine

## 2017-01-01 ENCOUNTER — Other Ambulatory Visit: Payer: Self-pay | Admitting: Family Medicine

## 2017-01-01 DIAGNOSIS — Z1231 Encounter for screening mammogram for malignant neoplasm of breast: Secondary | ICD-10-CM

## 2017-01-09 ENCOUNTER — Ambulatory Visit (HOSPITAL_COMMUNITY)
Admission: RE | Admit: 2017-01-09 | Discharge: 2017-01-09 | Disposition: A | Discharge status: Home / Self Care | Payer: Medicare Other | Source: Ambulatory Visit | Attending: Family Medicine | Admitting: Family Medicine

## 2017-01-09 DIAGNOSIS — Z1231 Encounter for screening mammogram for malignant neoplasm of breast: Secondary | ICD-10-CM | POA: Diagnosis not present

## 2017-04-27 ENCOUNTER — Other Ambulatory Visit: Payer: Self-pay | Admitting: Family Medicine

## 2017-05-03 ENCOUNTER — Other Ambulatory Visit: Payer: Self-pay | Admitting: Family Medicine

## 2017-05-04 ENCOUNTER — Other Ambulatory Visit: Payer: Self-pay | Admitting: Family Medicine

## 2017-05-04 NOTE — Telephone Encounter (Signed)
Medication refill for one time only.  Patient needs to be seen.  Letter sent for patient to call and schedule 

## 2017-05-07 ENCOUNTER — Other Ambulatory Visit: Payer: Self-pay | Admitting: Family Medicine

## 2017-05-25 ENCOUNTER — Other Ambulatory Visit: Payer: Self-pay | Admitting: Family Medicine

## 2017-06-01 ENCOUNTER — Other Ambulatory Visit: Payer: Self-pay | Admitting: Family Medicine

## 2017-06-04 ENCOUNTER — Other Ambulatory Visit: Payer: Self-pay | Admitting: Family Medicine

## 2017-06-12 ENCOUNTER — Encounter: Payer: Self-pay | Admitting: Family Medicine

## 2017-06-12 ENCOUNTER — Ambulatory Visit (INDEPENDENT_AMBULATORY_CARE_PROVIDER_SITE_OTHER): Payer: Medicare Other | Admitting: Family Medicine

## 2017-06-12 VITALS — BP 140/60 | HR 68 | Temp 98.7°F | Resp 16 | Ht 64.0 in | Wt 153.0 lb

## 2017-06-12 DIAGNOSIS — Z0000 Encounter for general adult medical examination without abnormal findings: Secondary | ICD-10-CM | POA: Diagnosis not present

## 2017-06-12 DIAGNOSIS — E7800 Pure hypercholesterolemia, unspecified: Secondary | ICD-10-CM | POA: Diagnosis not present

## 2017-06-12 DIAGNOSIS — I10 Essential (primary) hypertension: Secondary | ICD-10-CM | POA: Diagnosis not present

## 2017-06-12 DIAGNOSIS — M85859 Other specified disorders of bone density and structure, unspecified thigh: Secondary | ICD-10-CM | POA: Diagnosis not present

## 2017-06-12 LAB — CBC WITH DIFFERENTIAL/PLATELET
BASOS ABS: 0 {cells}/uL (ref 0–200)
Basophils Relative: 0 %
EOS PCT: 2 %
Eosinophils Absolute: 118 cells/uL (ref 15–500)
HEMATOCRIT: 39.3 % (ref 35.0–45.0)
Hemoglobin: 12.7 g/dL (ref 12.0–15.0)
LYMPHS PCT: 26 %
Lymphs Abs: 1534 cells/uL (ref 850–3900)
MCH: 30.6 pg (ref 27.0–33.0)
MCHC: 32.3 g/dL (ref 32.0–36.0)
MCV: 94.7 fL (ref 80.0–100.0)
MONO ABS: 472 {cells}/uL (ref 200–950)
MPV: 10.1 fL (ref 7.5–12.5)
Monocytes Relative: 8 %
NEUTROS PCT: 64 %
Neutro Abs: 3776 cells/uL (ref 1500–7800)
Platelets: 242 10*3/uL (ref 140–400)
RBC: 4.15 MIL/uL (ref 3.80–5.10)
RDW: 13.5 % (ref 11.0–15.0)
WBC: 5.9 10*3/uL (ref 3.8–10.8)

## 2017-06-12 LAB — LIPID PANEL
Cholesterol: 128 mg/dL (ref <200)
HDL: 67 mg/dL (ref >50)
LDL CALC: 48 mg/dL (ref <100)
Total CHOL/HDL Ratio: 1.9 Ratio (ref <5.0)
Triglycerides: 63 mg/dL (ref <150)
VLDL: 13 mg/dL (ref <30)

## 2017-06-12 LAB — COMPLETE METABOLIC PANEL WITH GFR
ALBUMIN: 4.4 g/dL (ref 3.6–5.1)
ALK PHOS: 54 U/L (ref 33–130)
ALT: 13 U/L (ref 6–29)
AST: 18 U/L (ref 10–35)
BUN: 13 mg/dL (ref 7–25)
CALCIUM: 9.6 mg/dL (ref 8.6–10.4)
CO2: 23 mmol/L (ref 20–31)
CREATININE: 0.67 mg/dL (ref 0.60–0.93)
Chloride: 106 mmol/L (ref 98–110)
GFR, Est African American: >89 mL/min (ref >=60)
GFR, Est Non African American: 89 mL/min (ref >=60)
Glucose, Bld: 98 mg/dL (ref 70–99)
Potassium: 4.3 mmol/L (ref 3.5–5.3)
Sodium: 142 mmol/L (ref 135–146)
TOTAL PROTEIN: 6.5 g/dL (ref 6.1–8.1)
Total Bilirubin: 0.4 mg/dL (ref 0.2–1.2)

## 2017-06-12 MED ORDER — LOSARTAN POTASSIUM 100 MG PO TABS: 100.0000 mg | ORAL_TABLET | Freq: Every day | ORAL | 3 refills | Status: DC

## 2017-06-12 NOTE — Progress Notes (Signed)
Subjective:    Patient ID: Stacey Moody, female    DOB: [birth year], 71 y.o.   MRN: [id number]  HPI Patient is here today for complete physical exam.  Colonoscopy was performed in 2017 and is up-to-date. Past medical history is significant for hysterectomy so that Pap smears are no longer necessary.  Mammogram was performed earlier this year and was normal. She has a past medical history of carotid artery stenosis. She is due to recheck carotid Dopplers in August. Her blood pressure is borderline elevated today and has been so at home as well with systolic blood pressures in the 140s. Immunization History  Administered Date(s) Administered  . Influenza,inj,Quad PF,36+ Mos 12/31/2014  . Pneumococcal Conjugate-13 03/15/2015  . Pneumococcal Polysaccharide-23 04/29/2011  . Td 03/16/2008  . Tdap 03/16/2008    Past Medical History:  Diagnosis Date  . Carotid artery occlusion   . GERD (gastroesophageal reflux disease)   . Hyperlipidemia   . Hypertension   . Influenza A 01/01/2014  . Osteopenia    T-2.3  . Syncope 12/31/2013   Vasovagal or hypovolemia/orthostatic   Past Surgical History:  Procedure Laterality Date  . ABDOMINAL HYSTERECTOMY  1982   Partial hysterectomy  . COLONOSCOPY N/A 07/30/2016   Procedure: COLONOSCOPY;  Surgeon: Daneil Dolin, MD;  Location: [facility];  Service: Endoscopy;  Laterality: N/A;  8:30 AM  . CT SCAN  July 2015   Chest  . POLYPECTOMY  07/30/2016   Procedure: POLYPECTOMY;  Surgeon: Daneil Dolin, MD;  Location: [facility];  Service: Endoscopy;;  descending colon  . SMALL INTESTINE SURGERY     Current Outpatient Prescriptions on File Prior to Visit  Medication Sig Dispense Refill  . alendronate (FOSAMAX) 70 MG tablet Take 1 tablet (70 mg total) by mouth every 7 (seven) days. Take with a full glass of water on an empty stomach. 4 tablet 11  . aspirin 325 MG tablet Take 325 mg by mouth at bedtime.     . cholecalciferol (VITAMIN D) 1000 UNITS tablet  Take 1,000 Units by mouth daily.    . Cyanocobalamin (VITAMIN B 12 PO) Take 1,000 mg by mouth daily.    . fluticasone (FLONASE) 50 MCG/ACT nasal spray INSTILL 2 SPRAYS IN EACH NOSTRIL DAILY 16 g 11  . hydrochlorothiazide (HYDRODIURIL) 25 MG tablet TAKE 1 TABLET BY MOUTH EVERY DAY 30 tablet 0  . losartan (COZAAR) 50 MG tablet TAKE 1 TABLET BY MOUTH EVERY DAY 90 tablet 3  . meclizine (ANTIVERT) 25 MG tablet Take 1 tablet (25 mg total) by mouth 3 (three) times daily as needed for dizziness. 30 tablet 0  . rosuvastatin (CRESTOR) 20 MG tablet TAKE 1 TABLET BY MOUTH EVERY DAY 90 tablet 0  . [DISCONTINUED] doxazosin (CARDURA) 4 MG tablet Take 1 tablet (4 mg total) by mouth daily. (Patient not taking: Reported on 12/29/2014) 30 tablet 3   No current facility-administered medications on file prior to visit.    Allergies  Allergen Reactions  . Tetanus Toxoids Swelling   Social History   Social History  . Marital status: Widowed    Spouse name: N/A  . Number of children: N/A  . Years of education: N/A   Occupational History  . Not on file.   Social History Main Topics  . Smoking status: Former Smoker    Quit date: 12/22/1996  . Smokeless tobacco: Never Used  . Alcohol use No  . Drug use: No  . Sexual activity: Not on  file   Other Topics Concern  . Not on file   Social History Narrative  . No narrative on file   Family History  Problem Relation Age of Onset  . Cancer Mother        pancratic  . Heart attack Mother   . Cancer Father        stomach  . Diabetes Father   . Hyperlipidemia Father   . Heart disease Father        NOT  before age 83  . Cancer Sister        lung  . Hyperlipidemia Sister   . Cancer Brother        thyroid, prostate  . Hyperlipidemia Brother   . Heart attack Brother   . Cancer Sister        colon  . Hyperlipidemia Sister   . Cancer Sister        colon  . Hyperlipidemia Sister       Review of Systems  All other systems reviewed and are  negative.      Objective:   Physical Exam  Constitutional: She is oriented to person, place, and time. She appears well-developed and well-nourished. No distress.  HENT:  Head: Normocephalic and atraumatic.  Right Ear: External ear normal.  Left Ear: External ear normal.  Nose: Nose normal.  Mouth/Throat: Oropharynx is clear and moist. No oropharyngeal exudate.  Eyes: Conjunctivae are normal. Pupils are equal, round, and reactive to light. Right eye exhibits no discharge. Left eye exhibits no discharge. No scleral icterus.  Neck: Normal range of motion. Neck supple. No JVD present. No tracheal deviation present. No thyromegaly present.  Cardiovascular: Normal rate, regular rhythm, normal heart sounds and intact distal pulses.  Exam reveals no gallop and no friction rub.   No murmur heard. Pulmonary/Chest: Effort normal and breath sounds normal. No stridor. No respiratory distress. She has no wheezes. She has no rales. She exhibits no tenderness.  Abdominal: Soft. Bowel sounds are normal. She exhibits no distension and no mass. There is no tenderness. There is no rebound and no guarding.  Musculoskeletal: Normal range of motion. She exhibits no edema or tenderness.  Lymphadenopathy:    She has no cervical adenopathy.  Neurological: She is alert and oriented to person, place, and time. She has normal reflexes. No cranial nerve deficit. She exhibits normal muscle tone. Coordination normal.  Skin: Skin is warm. No rash noted. She is not diaphoretic. No erythema. No pallor.  Psychiatric: She has a normal mood and affect. Her behavior is normal. Judgment and thought content normal.  Vitals reviewed.         Assessment & Plan:  Benign essential HTN - Plan: COMPLETE METABOLIC PANEL WITH GFR, CBC with Differential/Platelet, Lipid panel  Routine general medical examination at a health care facility  Pure hypercholesterolemia  Osteopenia of hip, unspecified laterality  Patient's  physical exam today is completely normal. Her blood pressure today is elevated.  I will increase her losartan to 100 mg a day. Check fasting lipid panel. Goal LDL cholesterol is less than 70. Evaluate carotid artery stenosis with carotid Dopplers in August. Immunizations are up-to-date. Cancer screening is up-to-date. Together we decided to discontinue Fosamax and recheck a bone density test next year.  Continue calcium and vitamin D

## 2017-06-16 ENCOUNTER — Encounter: Payer: Self-pay | Admitting: Family Medicine

## 2017-07-16 ENCOUNTER — Encounter: Payer: Self-pay | Admitting: Family

## 2017-08-04 ENCOUNTER — Ambulatory Visit (HOSPITAL_COMMUNITY)
Admission: RE | Admit: 2017-08-04 | Discharge: 2017-08-04 | Disposition: A | Discharge status: Home / Self Care | Payer: Medicare Other | Source: Ambulatory Visit | Attending: Vascular Surgery | Admitting: Vascular Surgery

## 2017-08-04 ENCOUNTER — Encounter: Payer: Self-pay | Admitting: Family

## 2017-08-04 ENCOUNTER — Ambulatory Visit (INDEPENDENT_AMBULATORY_CARE_PROVIDER_SITE_OTHER): Payer: Medicare Other | Admitting: Family

## 2017-08-04 VITALS — BP 138/68 | HR 58 | Temp 97.4°F | Resp 16 | Ht 64.0 in | Wt 153.0 lb

## 2017-08-04 DIAGNOSIS — I6523 Occlusion and stenosis of bilateral carotid arteries: Secondary | ICD-10-CM

## 2017-08-04 DIAGNOSIS — Z87891 Personal history of nicotine dependence: Secondary | ICD-10-CM

## 2017-08-04 LAB — VAS US CAROTID
LCCAPDIAS: 13 cm/s
LCCAPSYS: 56 cm/s
LEFT ECA DIAS: -18 cm/s
Left CCA dist dias: 15 cm/s
Left CCA dist sys: 54 cm/s
Left ICA dist dias: -23 cm/s
Left ICA dist sys: -66 cm/s
Left ICA prox dias: -35 cm/s
Left ICA prox sys: -139 cm/s
RCCAPDIAS: 15 cm/s
RIGHT CCA MID DIAS: 17 cm/s
RIGHT ECA DIAS: -20 cm/s
Right CCA prox sys: 80 cm/s
Right cca dist sys: -83 cm/s

## 2017-08-04 NOTE — Progress Notes (Signed)
Chief Complaint: Follow up Extracranial Carotid Artery Stenosis   History of Present Illness  Stacey Moody is a 71 y.o. female patient of Dr. Donnetta Hutching whopresents today for followup of her known asymptomatic extracranial cerebrovascular occlusive disease. She remains quite active in excellent health with no neurologic deficits.   The patient denies any history of TIA or stroke symptoms Specifically the patient denies a history of amaurosis fugax or monocular blindness, unilateral facial drooping, hemiplegia, or receptive or expressive aphasia.    She has no cardiac disease.  She states her blood pressure at home usually runs 140's/60's-70's and always increases in a medical office. She stays physically active, denies ETOH use. She denies claudication symptoms with walking.  Patient has not had previous carotid artery intervention.  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   Pt Diabetic: no Pt smoker: former smoker, quit about 1998  Pt meds include: Statin : yes ASA: yes Other anticoagulants/antiplatelets: no    Past Medical History:  Diagnosis Date  . Carotid artery occlusion   . GERD (gastroesophageal reflux disease)   . Hyperlipidemia   . Hypertension   . Influenza A 01/01/2014  . Osteopenia    T-2.3  . Syncope 12/31/2013   Vasovagal or hypovolemia/orthostatic    Social History Social History  Substance Use Topics  . Smoking status: Former Smoker    Quit date: 12/22/1996  . Smokeless tobacco: Never Used  . Alcohol use No    Family History Family History  Problem Relation Age of Onset  . Cancer Mother        pancratic  . Heart attack Mother   . Cancer Father        stomach  . Diabetes Father   . Hyperlipidemia Father   . Heart disease Father        NOT  before age 45  . Cancer Sister        lung  . Hyperlipidemia Sister   . Cancer Brother        thyroid, prostate  . Hyperlipidemia  Brother   . Heart attack Brother   . Cancer Sister        colon  . Hyperlipidemia Sister   . Cancer Sister        colon  . Hyperlipidemia Sister     Surgical History Past Surgical History:  Procedure Laterality Date  . ABDOMINAL HYSTERECTOMY  1982   Partial hysterectomy  . COLONOSCOPY N/A 07/30/2016   Procedure: COLONOSCOPY;  Surgeon: Daneil Dolin, MD;  Location: [facility];  Service: Endoscopy;  Laterality: N/A;  8:30 AM  . CT SCAN  July 2015   Chest  . POLYPECTOMY  07/30/2016   Procedure: POLYPECTOMY;  Surgeon: Daneil Dolin, MD;  Location: [facility];  Service: Endoscopy;;  descending colon  . SMALL INTESTINE SURGERY      Allergies  Allergen Reactions  . Tetanus Toxoids Swelling    Current Outpatient Prescriptions  Medication Sig Dispense Refill  . aspirin 325 MG tablet Take 325 mg by mouth at bedtime.     . cholecalciferol (VITAMIN D) 1000 UNITS tablet Take 1,000 Units by mouth daily.    . Cyanocobalamin (VITAMIN B 12 PO) Take 1,000 mg by mouth daily.    . hydrochlorothiazide (HYDRODIURIL) 25 MG tablet TAKE 1 TABLET BY MOUTH EVERY DAY 30 tablet 0  . losartan (COZAAR) 100 MG tablet Take 1 tablet (  100 mg total) by mouth daily. 90 tablet 3  . losartan (COZAAR) 50 MG tablet TAKE 1 TABLET BY MOUTH EVERY DAY 90 tablet 3  . rosuvastatin (CRESTOR) 20 MG tablet TAKE 1 TABLET BY MOUTH EVERY DAY 90 tablet 0   No current facility-administered medications for this visit.     Review of Systems : See HPI for pertinent positives and negatives.  Physical Examination  Vitals:   08/04/17 1135 08/04/17 1137  BP: (!) 145/73 138/68  Pulse: (!) 58   Resp: 16   Temp: (!) 97.4 F (36.3 C)   TempSrc: Oral   SpO2: 95%   Weight: 153 lb (69.4 kg)   Height: 5\' 4"  (1.626 m)    Body mass index is 26.26 kg/m.  General: WDWN female in NAD GAIT:normal Eyes: PERRLA Pulmonary: Respirations are non-labored, CTAB, good air movement in all fields. Cardiac: regular rhythm, rate at  58 (not on a beta blocker), no detected murmur.  VASCULAR EXAM Carotid Bruits Right Left   positive Negative  Aorta is not palpable. Radial pulses are 2+ palpable and equal.   LE Pulses Right Left  POPLITEAL not palpable not palpable  POSTERIOR TIBIAL 2+ palpable 2+ palpable  DORSALIS PEDIS ANTERIOR TIBIAL not palpable not palpable    Gastrointestinal: soft, nontender, BS WNL, no r/g, no palpable masses.  Musculoskeletal: No muscle atrophy/wasting. M/S 5/5 throughout, extremities without ischemic changes.  Neurologic: A&O X 3; appropriate affect, speech is normal, CN 2-12 intact, pain and light touch intact in extremities, Motor exam as listed above.   Assessment: Stacey Moody is a 71 y.o. female who has a history of mild/moderate carotid artery stenosis. She has no history of stroke or TIA.  Her atherosclerotic risk factors include former smoker (quit in 1988). Fortunately she does not have DM, does not have any known history of CAD.  She takes a statin and ASA daily.   DATA Carotid Duplex (08/04/17): <40% right internal carotid artery stenosis. 71 -59% left internal carotid artery stenosis. >50% right external carotid artery stenosis.  Bilateral vertebral artery flow is antegrade.  Bilateral subclavian artery waveforms are normal.  No significant change since exams on 07/18/14, 07/24/15, and 07/29/16.    Plan: Follow-up in 1 year with Carotid Duplex scan   I discussed in depth with the patient the nature of atherosclerosis, and emphasized the importance of maximal medical management including strict control of blood pressure, blood glucose, and lipid levels, obtaining regular exercise, and continued cessation of smoking.  The patient is aware that without maximal medical management the underlying atherosclerotic disease process will progress, limiting the benefit of any interventions. The patient was given  information about stroke prevention and what symptoms should prompt the patient to seek immediate medical care. Thank you for allowing [place] to participate in this patient's care.  Clemon Chambers, RN, MSN, FNP-C [facility] Office: [phone]  Clinic Physician: Early  08/04/17 11:54 AM

## 2017-08-04 NOTE — Patient Instructions (Signed)
Stroke Prevention Some medical conditions and behaviors are associated with an increased chance of having a stroke. You may prevent a stroke by making healthy choices and managing medical conditions. How can I reduce my risk of having a stroke?  Stay physically active. Get at least 30 minutes of activity on most or all days.  Do not smoke. It may also be helpful to avoid exposure to secondhand smoke.  Limit alcohol use. Moderate alcohol use is considered to be: ? No more than 2 drinks per day for men. ? No more than 1 drink per day for nonpregnant women.  Eat healthy foods. This involves: ? Eating 5 or more servings of fruits and vegetables a day. ? Making dietary changes that address high blood pressure (hypertension), high cholesterol, diabetes, or obesity.  Manage your cholesterol levels. ? Making food choices that are high in fiber and low in saturated fat, trans fat, and cholesterol may control cholesterol levels. ? Take any prescribed medicines to control cholesterol as directed by your health care provider.  Manage your diabetes. ? Controlling your carbohydrate and sugar intake is recommended to manage diabetes. ? Take any prescribed medicines to control diabetes as directed by your health care provider.  Control your hypertension. ? Making food choices that are low in salt (sodium), saturated fat, trans fat, and cholesterol is recommended to manage hypertension. ? Ask your health care provider if you need treatment to lower your blood pressure. Take any prescribed medicines to control hypertension as directed by your health care provider. ? If you are 18-39 years of age, have your blood pressure checked every 3-5 years. If you are 40 years of age or older, have your blood pressure checked every year.  Maintain a healthy weight. ? Reducing calorie intake and making food choices that are low in sodium, saturated fat, trans fat, and cholesterol are recommended to manage  weight.  Stop drug abuse.  Avoid taking birth control pills. ? Talk to your health care provider about the risks of taking birth control pills if you are over 35 years old, smoke, get migraines, or have ever had a blood clot.  Get evaluated for sleep disorders (sleep apnea). ? Talk to your health care provider about getting a sleep evaluation if you snore a lot or have excessive sleepiness.  Take medicines only as directed by your health care provider. ? For some people, aspirin or blood thinners (anticoagulants) are helpful in reducing the risk of forming abnormal blood clots that can lead to stroke. If you have the irregular heart rhythm of atrial fibrillation, you should be on a blood thinner unless there is a good reason you cannot take them. ? Understand all your medicine instructions.  Make sure that other conditions (such as anemia or atherosclerosis) are addressed. Get help right away if:  You have sudden weakness or numbness of the face, arm, or leg, especially on one side of the body.  Your face or eyelid droops to one side.  You have sudden confusion.  You have trouble speaking (aphasia) or understanding.  You have sudden trouble seeing in one or both eyes.  You have sudden trouble walking.  You have dizziness.  You have a loss of balance or coordination.  You have a sudden, severe headache with no known cause.  You have new chest pain or an irregular heartbeat. Any of these symptoms may represent a serious problem that is an emergency. Do not wait to see if the symptoms will go away.   Get medical help at once. Call your local emergency services (911 in [place] Do not drive yourself to the hospital. This information is not intended to replace advice given to you by your health care provider. Make sure you discuss any questions you have with your health care provider. Document Released: 01/15/2005 Document Revised: 05/15/2016 Document Reviewed: 06/10/2013 Elsevier  Interactive Patient Education  2017 Elsevier Inc.     Preventing Cerebrovascular Disease Arteries are blood vessels that carry blood that contains oxygen from the heart to all parts of the body. Cerebrovascular disease affects arteries that supply the brain. Any condition that blocks or disrupts blood flow to the brain can cause cerebrovascular disease. Brain cells that lose blood supply start to die within minutes (stroke). Stroke is the main danger of cerebrovascular disease. Atherosclerosis and high blood pressure are common causes of cerebrovascular disease. Atherosclerosis is narrowing and hardening of an artery that results when fat, cholesterol, calcium, or other substances (plaque) build up inside an artery. Plaque reduces blood flow through the artery. High blood pressure increases the risk of bleeding inside the brain. Making diet and lifestyle changes to prevent atherosclerosis and high blood pressure lowers your risk of cerebrovascular disease. What nutrition changes can be made?  Eat more fruits, vegetables, and whole grains.  Reduce how much saturated fat you eat. To do this, eat less red meat and fewer full-fat dairy products.  Eat healthy proteins instead of red meat. Healthy proteins include: ? Fish. Eat fish that contains heart-healthy omega-3 fatty acids, twice a week. Examples include salmon, albacore tuna, mackerel, and herring. ? Chicken. ? Nuts. ? Low-fat or nonfat yogurt.  Avoid processed meats, like bacon and lunchmeat.  Avoid foods that contain: ? A lot of sugar, such as sweets and drinks with added sugar. ? A lot of salt (sodium). Avoid adding extra salt to your food, as told by your health care provider. ? Trans fats, such as margarine and baked goods. Trans fats may be listed as "partially hydrogenated oils" on food labels.  Check food labels to see how much sodium, sugar, and trans fats are in foods.  Use vegetable oils that contain low amounts of  saturated fat, such as olive oil or canola oil. What lifestyle changes can be made?  Drink alcohol in moderation. This means no more than 1 drink a day for nonpregnant women and 2 drinks a day for men. One drink equals 12 oz of beer, 5 oz of wine, or 1 oz of hard liquor.  If you are overweight, ask your health care provider to recommend a weight-loss plan for you. Losing 5-10 lb (2.2-4.5 kg) can reduce your risk of diabetes, atherosclerosis, and high blood pressure.  Exercise for 30?60 minutes on most days, or as much as told by your health care provider. ? Do moderate-intensity exercise, such as brisk walking, bicycling, and water aerobics. Ask your health care provider which activities are safe for you.  Do not use any products that contain nicotine or tobacco, such as cigarettes and e-cigarettes. If you need help quitting, ask your health care provider. Why are these changes important? Making these changes lowers your risk of many diseases that can cause cerebrovascular disease and stroke. Stroke is a leading cause of death and disability. Making these changes also improves your overall health and quality of life. What can I do to lower my risk? The following factors make you more likely to develop cerebrovascular disease:  Being overweight.  Smoking.  Being physically inactive.    Eating a high-fat diet.  Having certain health conditions, such as: ? Diabetes. ? High blood pressure. ? Heart disease. ? Atherosclerosis. ? High cholesterol. ? Sickle cell disease.  Talk with your health care provider about your risk for cerebrovascular disease. Work with your health care provider to control diseases that you have that may contribute to cerebrovascular disease. Your health care provider may prescribe medicines to help prevent major causes of cerebrovascular disease. Where to find more information: Learn more about preventing cerebrovascular disease from:  National Heart, Lung, and  Blood Institute: www.nhlbi.nih.gov/health/health-topics/topics/stroke  Centers for Disease Control and Prevention: cdc.gov/stroke/about.htm  Summary  Cerebrovascular disease can lead to a stroke.  Atherosclerosis and high blood pressure are major causes of cerebrovascular disease.  Making diet and lifestyle changes can reduce your risk of cerebrovascular disease.  Work with your health care provider to get your risk factors under control to reduce your risk of cerebrovascular disease. This information is not intended to replace advice given to you by your health care provider. Make sure you discuss any questions you have with your health care provider. Document Released: 12/23/2015 Document Revised: 06/27/2016 Document Reviewed: 12/23/2015 Elsevier Interactive Patient Education  2018 Elsevier Inc.  

## 2017-08-10 NOTE — Addendum Note (Signed)
Addended by: Lianne Cure A on: 08/10/2017 04:38 PM   Modules accepted: Orders

## 2017-08-30 ENCOUNTER — Other Ambulatory Visit: Payer: Self-pay | Admitting: Family Medicine

## 2017-11-13 DIAGNOSIS — Z23 Encounter for immunization: Secondary | ICD-10-CM | POA: Diagnosis not present

## 2017-11-17 ENCOUNTER — Telehealth: Payer: Self-pay | Admitting: Family Medicine

## 2017-11-17 MED ORDER — MECLIZINE HCL 25 MG PO TABS: 25.0000 mg | ORAL_TABLET | Freq: Three times a day (TID) | ORAL | 1 refills | Status: DC

## 2017-11-17 NOTE — Telephone Encounter (Signed)
Pt was dx'd in 2017 in ED with vertigo and given Meclizine. Ok to refill or NTBS?

## 2017-11-17 NOTE — Telephone Encounter (Signed)
Medication called/sent to requested pharmacy and pt aware 

## 2017-11-17 NOTE — Telephone Encounter (Signed)
Okay to send in meclizine 25mg  q 8 hrs prn dizziness If not improving come in for visit

## 2017-11-17 NOTE — Telephone Encounter (Signed)
Patient is calling to say she has vertigo, would like to know if something can be called in for this  [phone]

## 2017-11-24 ENCOUNTER — Other Ambulatory Visit: Payer: Self-pay | Admitting: Family Medicine

## 2017-11-24 ENCOUNTER — Telehealth: Payer: Self-pay

## 2017-11-24 MED ORDER — AMLODIPINE BESYLATE 5 MG PO TABS: 5.0000 mg | ORAL_TABLET | Freq: Every day | ORAL | 0 refills | Status: DC

## 2017-11-24 NOTE — Telephone Encounter (Signed)
I would add amlodipine 5 mg poqday for the elevated blood pressure to her losartan and hctz.

## 2017-11-24 NOTE — Telephone Encounter (Signed)
lvm for patient

## 2017-11-24 NOTE — Telephone Encounter (Signed)
Patient called with blood pressure concerns. Patient was not able to provide readings from previous days but states this morning 12/4 at 650 am before taking b/p medicine it was 182/74 after taking medicine around 825am it was 160/67.  Patient states she  felt dizzy last week and continues to feel  fatigue. Patient denies having a headache, patient denies numbness or tingling in arms or hand.Patient was instructed to go to Urgent care since she felt fatigued and had dizzy spells. Patient would rather be seen in office so the next available appointment  was scheduled  with Stacey Moody on 12/5 at 15 am. Patient was still instructed to go to Urgent Care if her b/p continues to go up over 160. Patient verbalizes understanding

## 2017-11-25 ENCOUNTER — Encounter: Payer: Self-pay | Admitting: Physician Assistant

## 2017-11-25 ENCOUNTER — Ambulatory Visit (INDEPENDENT_AMBULATORY_CARE_PROVIDER_SITE_OTHER): Payer: Medicare Other | Admitting: Physician Assistant

## 2017-11-25 ENCOUNTER — Other Ambulatory Visit: Payer: Self-pay

## 2017-11-25 VITALS — BP 158/70 | HR 74 | Temp 97.9°F | Resp 16 | Ht 64.0 in | Wt 153.4 lb

## 2017-11-25 DIAGNOSIS — I6523 Occlusion and stenosis of bilateral carotid arteries: Secondary | ICD-10-CM

## 2017-11-25 DIAGNOSIS — I10 Essential (primary) hypertension: Secondary | ICD-10-CM | POA: Diagnosis not present

## 2017-11-25 MED ORDER — FLUTICASONE PROPIONATE 50 MCG/ACT NA SUSP: 2.0000 | Freq: Every day | NASAL | 6 refills | Status: DC

## 2017-11-25 NOTE — Progress Notes (Signed)
Patient ID: JAZZALYN LOEWENSTEIN MRN: [id number], DOB: 08-16-1946, 71 y.o. Date of Encounter: 11/25/2017, 9:44 AM    Chief Complaint:  Chief Complaint  Patient presents with  . discuss blood pressure     HPI: 71 y.o. year old female presents for above.   She called yesterday with concerns regarding her blood pressure.  Reported that recent blood pressure readings were 182/74 and 160/67. She was recommended to add amlodipine 5 mg daily in addition to her losartan and HCT and come in for follow-up visit.  She now presents for follow-up visit.  States that she did add the amlodipine 5 mg.  However, she reports that she remembers amlodipine caused swelling in her feet and ankles in the past and that is when amlodipine was discontinued and losartan was started. Even though she did remember this, she did go ahead and start the amlodipine.  Has only taken this morning's dose.  Is not having any swelling (yet.)  Did review her record and found that it was at visit 05/09/2013 that amlodipine was discontinued due to swelling and was replaced with losartan.    Home Meds:   Outpatient Medications Prior to Visit  Medication Sig Dispense Refill  . amLODipine (NORVASC) 5 MG tablet TAKE 1 TABLET(5 MG) BY MOUTH DAILY 90 tablet 0  . aspirin 325 MG tablet Take 325 mg by mouth at bedtime.     . cholecalciferol (VITAMIN D) 1000 UNITS tablet Take 1,000 Units by mouth daily.    . Cyanocobalamin (VITAMIN B 12 PO) Take 1,000 mg by mouth daily.    . hydrochlorothiazide (HYDRODIURIL) 25 MG tablet TAKE 1 TABLET BY MOUTH EVERY DAY 30 tablet 0  . losartan (COZAAR) 100 MG tablet Take 1 tablet (100 mg total) by mouth daily. 90 tablet 3  . meclizine (ANTIVERT) 25 MG tablet Take 1 tablet (25 mg total) by mouth every 8 (eight) hours. 30 tablet 1  . rosuvastatin (CRESTOR) 20 MG tablet TAKE 1 TABLET BY MOUTH EVERY DAY 90 tablet 0  . losartan (COZAAR) 50 MG tablet TAKE 1 TABLET BY MOUTH EVERY DAY 90 tablet 3   No  facility-administered medications prior to visit.     Allergies:  Allergies  Allergen Reactions  . Tetanus Toxoids Swelling      Review of Systems: See HPI for pertinent ROS. All other ROS negative.    Physical Exam: Blood pressure (!) 158/70, pulse 74, temperature 97.9 F (36.6 C), temperature source Oral, resp. rate 16, height 5\' 4"  (1.626 m), weight 69.6 kg (153 lb 6.4 oz), SpO2 98 %., Body mass index is 26.33 kg/m. General:  WNWD WF. Appears in no acute distress. Neck: Supple. No thyromegaly. No lymphadenopathy.  Right carotid bruit.  No bruit on the left. Lungs: Clear bilaterally to auscultation without wheezes, rales, or rhonchi. Breathing is unlabored. Heart: Regular rhythm. No murmurs, rubs, or gallops. Msk:  Strength and tone normal for age. Extremities/Skin: Warm and dry. No edema.  Neuro: Alert and oriented X 3. Moves all extremities spontaneously. Gait is normal. CNII-XII grossly in tact. Psych:  Responds to questions appropriately with a normal affect.     ASSESSMENT AND PLAN:  71 y.o. year old female with  1. Essential hypertension Discussed with her that another option rather than using amlodipine would be to use Bystolic 5 mg daily. Discussed that she has had lower extremity edema when using amlodipine in the past.  At this point she has already bought the amlodipine and has taken  a dose this morning and is not having any edema so far.  She is agreeable to go ahead and take amlodipine daily and monitor for edema.   If she develops edema then she will stop the amlodipine and switch to Bystolic.   I went ahead and gave her 2 sample bottles of Bystolic 5 mg so that she will have these available if needed.  Each bottle contains 7 tablets so this gives her 2 weeks worth.  She is scheduling follow-up visit with Dr. Dennard Schaumann in 2 weeks to recheck blood pressure.   At that point she will either be on the amlodipine or the bysystolic--- in addition to her current losartan  100 mg and her HCTZ.   Signed, [address] [place], [place], [facility] 11/25/2017 9:44 AM

## 2017-11-26 ENCOUNTER — Encounter (HOSPITAL_COMMUNITY): Payer: Self-pay | Admitting: Emergency Medicine

## 2017-11-26 ENCOUNTER — Emergency Department (HOSPITAL_COMMUNITY)
Admission: EM | Admit: 2017-11-26 | Discharge: 2017-11-26 | Disposition: A | Discharge status: Home / Self Care | Payer: Medicare Other | Attending: Emergency Medicine | Admitting: Emergency Medicine

## 2017-11-26 DIAGNOSIS — Z87891 Personal history of nicotine dependence: Secondary | ICD-10-CM | POA: Insufficient documentation

## 2017-11-26 DIAGNOSIS — Z7982 Long term (current) use of aspirin: Secondary | ICD-10-CM | POA: Diagnosis not present

## 2017-11-26 DIAGNOSIS — I10 Essential (primary) hypertension: Secondary | ICD-10-CM | POA: Insufficient documentation

## 2017-11-26 DIAGNOSIS — Z79899 Other long term (current) drug therapy: Secondary | ICD-10-CM | POA: Insufficient documentation

## 2017-11-26 NOTE — Discharge Instructions (Signed)
Follow-up with your primary care doctor by phone tomorrow.  Continue to check your blood pressure daily.  Definitely return to the emergency department for any concerns for stroke severe headache severe chest pain difficulty breathing.  Continue current medications until you have conversation with your primary care doctor.

## 2017-11-26 NOTE — ED Provider Notes (Signed)
[facility] EMERGENCY DEPARTMENT Provider Note   CSN: [id number] Arrival date & time: 11/26/17  1518     History   Chief Complaint Chief Complaint  Patient presents with  . Hypertension    HPI Stacey Moody is a 71 y.o. female.  Patient has a history of hypertension.  Is been taking her medications but is been running high of late.  [facility]  Seen there yesterday by their nurse practitioner and had a new medication Norvasc added on to her regiment of Cozaar and hydrochlorothiazide.  Patient states her blood pressures are still high.  States that they have been in the 403K diastolic pressures have been below 90.  Patient states she can tell when her pressures are high because she gets dizzy denies any true vertigo she has had that in the past.  Denies any speech problems any numbness or weakness.  Denies any severe headache shortness of breath chest pain.      Past Medical History:  Diagnosis Date  . Carotid artery occlusion   . GERD (gastroesophageal reflux disease)   . Hyperlipidemia   . Hypertension   . Influenza A 01/01/2014  . Osteopenia    T-2.3  . Syncope 12/31/2013   Vasovagal or hypovolemia/orthostatic    Patient Active Problem List   Diagnosis Date Noted  . Osteopenia   . Abdominal pain   . Legionella infection ([place])   . Gastroenteritis 12/31/2014  . Elevated transaminase level 12/31/2014  . CAP (community acquired pneumonia) 12/31/2014  . Nausea vomiting and diarrhea 12/30/2014  . Hypokalemia 12/30/2014  . Hyponatremia 12/30/2014  . Dehydration 12/30/2014  . Fever 12/30/2014  . Other pancytopenia ([place]) 01/03/2014  . Carotid artery stenosis 01/03/2014  . Vertigo 01/02/2014  . Right otitis media 01/02/2014  . Leukopenia 01/02/2014  . Influenza A 01/01/2014  . Scalp hematoma 01/01/2014  . Syncope 12/31/2013  . Diarrhea 12/31/2013  . URI (upper respiratory infection) 12/31/2013  . Hypertension   . Hyperlipidemia    . GERD (gastroesophageal reflux disease)   . Occlusion and stenosis of carotid artery without mention of cerebral infarction 06/22/2012    Past Surgical History:  Procedure Laterality Date  . ABDOMINAL HYSTERECTOMY  1982   Partial hysterectomy  . COLONOSCOPY N/A 07/30/2016   Procedure: COLONOSCOPY;  Surgeon: Daneil Dolin, MD;  Location: [facility];  Service: Endoscopy;  Laterality: N/A;  8:30 AM  . CT SCAN  July 2015   Chest  . POLYPECTOMY  07/30/2016   Procedure: POLYPECTOMY;  Surgeon: Daneil Dolin, MD;  Location: [facility];  Service: Endoscopy;;  descending colon  . SMALL INTESTINE SURGERY      OB History    No data available       Home Medications    Prior to Admission medications   Medication Sig Start Date End Date Taking? Authorizing Provider  amLODipine (NORVASC) 5 MG tablet TAKE 1 TABLET(5 MG) BY MOUTH DAILY 11/24/17  Yes Susy Frizzle, MD  aspirin 325 MG tablet Take 325 mg by mouth at bedtime.    Yes [provider]  cetirizine (ZYRTEC) 10 MG tablet Take 10 mg by mouth daily as needed for allergies.   Yes [provider]  cholecalciferol (VITAMIN D) 1000 UNITS tablet Take 1,000 Units by mouth daily.   Yes [provider]  Cyanocobalamin (VITAMIN B 12 PO) Take 1,000 mg by mouth daily.   Yes [provider]  fluticasone (FLONASE) 50 MCG/ACT  nasal spray Place 2 sprays into both nostrils daily. Patient taking differently: Place 2 sprays into both nostrils daily as needed for allergies.  11/25/17  Yes Dena Billet B, PA-C  hydrochlorothiazide (HYDRODIURIL) 25 MG tablet TAKE 1 TABLET BY MOUTH EVERY DAY 05/04/17  Yes Susy Frizzle, MD  losartan (COZAAR) 100 MG tablet Take 1 tablet (100 mg total) by mouth daily. 06/12/17  Yes Susy Frizzle, MD  meclizine (ANTIVERT) 25 MG tablet Take 1 tablet (25 mg total) by mouth every 8 (eight) hours. 11/17/17  Yes [place], Modena Nunnery, MD  rosuvastatin (CRESTOR) 20 MG tablet TAKE 1 TABLET BY  MOUTH EVERY DAY 08/31/17  Yes Susy Frizzle, MD    Family History Family History  Problem Relation Age of Onset  . Cancer Mother        pancratic  . Heart attack Mother   . Cancer Father        stomach  . Diabetes Father   . Hyperlipidemia Father   . Heart disease Father        NOT  before age 68  . Cancer Sister        lung  . Hyperlipidemia Sister   . Cancer Brother        thyroid, prostate  . Hyperlipidemia Brother   . Heart attack Brother   . Cancer Sister        colon  . Hyperlipidemia Sister   . Cancer Sister        colon  . Hyperlipidemia Sister     Social History Social History   Tobacco Use  . Smoking status: Former Smoker    Last attempt to quit: 12/22/1996    Years since quitting: 20.9  . Smokeless tobacco: Never Used  Substance Use Topics  . Alcohol use: No  . Drug use: No     Allergies   Tetanus toxoids   Review of Systems Review of Systems  Constitutional: Negative for fever.  HENT: Negative for congestion.   Eyes: Negative for photophobia and visual disturbance.  Respiratory: Negative for shortness of breath.   Cardiovascular: Negative for chest pain.  Gastrointestinal: Negative for abdominal pain.  Genitourinary: Negative for dysuria.  Musculoskeletal: Negative for back pain and neck pain.  Skin: Negative for rash.  Neurological: Positive for dizziness. Negative for light-headedness and headaches.  Hematological: Does not bruise/bleed easily.  Psychiatric/Behavioral: Negative for confusion.     Physical Exam Updated Vital Signs BP (!) 195/68 (BP Location: Right Arm)   Pulse 89   Temp 97.6 F (36.4 C)   Resp 18   SpO2 97%   Physical Exam  Constitutional: She is oriented to person, place, and time. She appears well-developed and well-nourished. No distress.  HENT:  Head: Normocephalic and atraumatic.  Mouth/Throat: Oropharynx is clear and moist.  Eyes: Conjunctivae and EOM are normal. Pupils are equal, round, and reactive  to light.  Neck: Normal range of motion. Neck supple.  Cardiovascular: Normal rate, regular rhythm and normal heart sounds.  Pulmonary/Chest: Effort normal and breath sounds normal. No respiratory distress.  Abdominal: Soft. Bowel sounds are normal. There is no tenderness.  Musculoskeletal: Normal range of motion. She exhibits no edema.  Neurological: She is alert and oriented to person, place, and time. She displays normal reflexes. No cranial nerve deficit. She exhibits normal muscle tone. Coordination normal.  Skin: Skin is warm. No rash noted.  Nursing note and vitals reviewed.    ED Treatments / Results  Labs (all labs  ordered are listed, but only abnormal results are displayed) Labs Reviewed - No data to display  EKG  EKG Interpretation None       Radiology No results found.  Procedures Procedures (including critical care time)  Medications Ordered in ED Medications - No data to display   Initial Impression / Assessment and Plan / ED Course  I have reviewed the triage vital signs and the nursing notes.  Pertinent labs & imaging results that were available during my care of the patient were reviewed by me and considered in my medical decision making (see chart for details).      Patient's blood pressures here have been more legs systolics of 174.  Diastolics have been fine.  Do not think patient's eliciting any signs of stroke certainly has no chest pain no shortness of breath no severe headache.  Feel that patient should probably give her new blood pressure medicine some time and take a daily blood pressure reading.  Did recommend she could call her primary care doctor tomorrow to see if they want to make any adjustments at this time.  Certainly no acute emergency requiring adjustments at this time nor admission.  The patient given precautions.  Final Clinical Impressions(s) / ED Diagnoses   Final diagnoses:  Essential hypertension    ED Discharge Orders     None       Fredia Sorrow, MD 11/27/17 [phone]

## 2017-11-26 NOTE — ED Triage Notes (Signed)
Pt reports her bp being high for the past week.  Saw pcp yesterday for medication changes.

## 2017-11-27 ENCOUNTER — Other Ambulatory Visit: Payer: Self-pay | Admitting: Family Medicine

## 2017-12-01 ENCOUNTER — Encounter (HOSPITAL_COMMUNITY): Payer: Self-pay | Admitting: *Deleted

## 2017-12-01 ENCOUNTER — Other Ambulatory Visit: Payer: Self-pay

## 2017-12-01 ENCOUNTER — Emergency Department (HOSPITAL_COMMUNITY): Payer: Medicare Other

## 2017-12-01 ENCOUNTER — Emergency Department (HOSPITAL_COMMUNITY)
Admission: EM | Admit: 2017-12-01 | Discharge: 2017-12-01 | Disposition: A | Discharge status: Home / Self Care | Payer: Medicare Other | Attending: Emergency Medicine | Admitting: Emergency Medicine

## 2017-12-01 DIAGNOSIS — R42 Dizziness and giddiness: Secondary | ICD-10-CM | POA: Insufficient documentation

## 2017-12-01 DIAGNOSIS — R110 Nausea: Secondary | ICD-10-CM | POA: Diagnosis not present

## 2017-12-01 DIAGNOSIS — Z79899 Other long term (current) drug therapy: Secondary | ICD-10-CM | POA: Diagnosis not present

## 2017-12-01 DIAGNOSIS — I10 Essential (primary) hypertension: Secondary | ICD-10-CM | POA: Diagnosis not present

## 2017-12-01 DIAGNOSIS — Z87891 Personal history of nicotine dependence: Secondary | ICD-10-CM | POA: Insufficient documentation

## 2017-12-01 DIAGNOSIS — R51 Headache: Secondary | ICD-10-CM | POA: Diagnosis not present

## 2017-12-01 LAB — HEPATIC FUNCTION PANEL
ALK PHOS: 68 U/L (ref 38–126)
ALT: 21 U/L (ref 14–54)
AST: 28 U/L (ref 15–41)
Albumin: 4.6 g/dL (ref 3.5–5.0)
BILIRUBIN INDIRECT: 0.4 mg/dL (ref 0.3–0.9)
Bilirubin, Direct: 0.1 mg/dL (ref 0.1–0.5)
Total Bilirubin: 0.5 mg/dL (ref 0.3–1.2)
Total Protein: 7.6 g/dL (ref 6.5–8.1)

## 2017-12-01 LAB — CBC
HCT: 38 % (ref 36.0–46.0)
Hemoglobin: 12.6 g/dL (ref 12.0–15.0)
MCH: 31.3 pg (ref 26.0–34.0)
MCHC: 33.2 g/dL (ref 30.0–36.0)
MCV: 94.3 fL (ref 78.0–100.0)
PLATELETS: 228 10*3/uL (ref 150–400)
RBC: 4.03 MIL/uL (ref 3.87–5.11)
RDW: 12.5 % (ref 11.5–15.5)
WBC: 8.9 10*3/uL (ref 4.0–10.5)

## 2017-12-01 LAB — URINALYSIS, ROUTINE W REFLEX MICROSCOPIC
Bacteria, UA: NONE SEEN
Bilirubin Urine: NEGATIVE
GLUCOSE, UA: NEGATIVE mg/dL
Hgb urine dipstick: NEGATIVE
KETONES UR: NEGATIVE mg/dL
Nitrite: NEGATIVE
PH: 6 (ref 5.0–8.0)
Protein, ur: NEGATIVE mg/dL
SPECIFIC GRAVITY, URINE: 1.004 — AB (ref 1.005–1.030)

## 2017-12-01 LAB — BASIC METABOLIC PANEL
ANION GAP: 10 (ref 5–15)
BUN: 11 mg/dL (ref 6–20)
CALCIUM: 9.8 mg/dL (ref 8.9–10.3)
CHLORIDE: 101 mmol/L (ref 101–111)
CO2: 26 mmol/L (ref 22–32)
CREATININE: 0.65 mg/dL (ref 0.44–1.00)
GFR calc non Af Amer: >60 mL/min (ref >60)
Glucose, Bld: 150 mg/dL — ABNORMAL HIGH (ref 65–99)
POTASSIUM: 2.9 mmol/L — AB (ref 3.5–5.1)
Sodium: 137 mmol/L (ref 135–145)

## 2017-12-01 LAB — I-STAT TROPONIN, ED: Troponin i, poc: 0 ng/mL (ref 0.00–0.08)

## 2017-12-01 MED ORDER — ONDANSETRON HCL 4 MG/2ML IJ SOLN
4.0000 mg | Freq: Once | INTRAMUSCULAR | Status: AC
  Administered 2017-12-01: 4 mg via INTRAVENOUS
  Filled 2017-12-01: qty 2

## 2017-12-01 MED ORDER — AMLODIPINE BESYLATE 10 MG PO TABS: 10.0000 mg | ORAL_TABLET | Freq: Every day | ORAL | 0 refills | Status: DC

## 2017-12-01 MED ORDER — ONDANSETRON 4 MG PO TBDP: 4.0000 mg | ORAL_TABLET | Freq: Three times a day (TID) | ORAL | 0 refills | Status: DC | PRN

## 2017-12-01 MED ORDER — AMLODIPINE BESYLATE 5 MG PO TABS
5.0000 mg | ORAL_TABLET | Freq: Once | ORAL | Status: AC
  Administered 2017-12-01: 5 mg via ORAL
  Filled 2017-12-01: qty 1

## 2017-12-01 NOTE — ED Notes (Signed)
Pt denies dizziness and nausea at this time

## 2017-12-01 NOTE — ED Provider Notes (Signed)
Emergency Department Provider Note   I have reviewed the triage vital signs and the nursing notes.   HISTORY  Chief Complaint Dizziness   HPI Stacey Moody is a 71 y.o. female with PMH of HLD, HTN, and GERD presents to the emergency department for evaluation of continued elevated blood pressure with episodic lightheadedness, head pressure, nausea, and leg discomfort.  Patient states that frequently during these episodes she will check her blood pressure and find it severely elevated.  Her primary care physician increased her blood pressure medication and may.  Last week she called to discuss the symptoms and was started on amlodipine.  Patient came to the emergency department on Friday with the symptoms and was discharged home.   Patient states that these episodes come on unprovoked.  She often feels lightheaded and nauseated for 1-2 hours and then symptoms resolved.  She never has chest pain, palpitations, or dyspnea.  No unilateral weakness or numbness but feels generally weak all over.  No fevers or chills.  No recent infection symptoms.  In all, the symptoms have been ongoing for 3 weeks intermittently.    Past Medical History:  Diagnosis Date  . Carotid artery occlusion   . GERD (gastroesophageal reflux disease)   . Hyperlipidemia   . Hypertension   . Influenza A 01/01/2014  . Osteopenia    T-2.3  . Syncope 12/31/2013   Vasovagal or hypovolemia/orthostatic    Patient Active Problem List   Diagnosis Date Noted  . Osteopenia   . Abdominal pain   . Legionella infection ([place])   . Gastroenteritis 12/31/2014  . Elevated transaminase level 12/31/2014  . CAP (community acquired pneumonia) 12/31/2014  . Nausea vomiting and diarrhea 12/30/2014  . Hypokalemia 12/30/2014  . Hyponatremia 12/30/2014  . Dehydration 12/30/2014  . Fever 12/30/2014  . Other pancytopenia ([place]) 01/03/2014  . Carotid artery stenosis 01/03/2014  . Vertigo 01/02/2014  . Right otitis media 01/02/2014  .  Leukopenia 01/02/2014  . Influenza A 01/01/2014  . Scalp hematoma 01/01/2014  . Syncope 12/31/2013  . Diarrhea 12/31/2013  . URI (upper respiratory infection) 12/31/2013  . Hypertension   . Hyperlipidemia   . GERD (gastroesophageal reflux disease)   . Occlusion and stenosis of carotid artery without mention of cerebral infarction 06/22/2012    Past Surgical History:  Procedure Laterality Date  . ABDOMINAL HYSTERECTOMY  1982   Partial hysterectomy  . COLONOSCOPY N/A 07/30/2016   Procedure: COLONOSCOPY;  Surgeon: Daneil Dolin, MD;  Location[facility];  Service: Endoscopy;  Laterality: N/A;  8:30 AM  . CT SCAN  July 2015   Chest  . POLYPECTOMY  07/30/2016   Procedure: POLYPECTOMY;  Surgeon: Daneil Dolin, MD;  Location: [facility];  Service: Endoscopy;;  descending colon  . SMALL INTESTINE SURGERY      Current Outpatient Rx  . Order #: [id number] Class: Historical Med  . Order #: [id number] Class: Historical Med  . Order #: [id number] Class: Historical Med  . Order #: [id number] Class: Normal  . Order #: [id number] Class: Normal  . Order #: [id number] Class: Normal  . Order #: [id number] Class: Normal  . Order #: [id number] Class: Normal  . Order #: [id number] Class: Print  . Order #: [id number] Class: Print    Allergies Tetanus toxoids  Family History  Problem Relation Age of Onset  . Cancer Mother        pancratic  . Heart attack Mother   . Cancer Father        stomach  . Diabetes  Father   . Hyperlipidemia Father   . Heart disease Father        NOT  before age 81  . Cancer Sister        lung  . Hyperlipidemia Sister   . Cancer Brother        thyroid, prostate  . Hyperlipidemia Brother   . Heart attack Brother   . Cancer Sister        colon  . Hyperlipidemia Sister   . Cancer Sister        colon  . Hyperlipidemia Sister     Social History Social History   Tobacco Use  . Smoking status: Former Smoker    Last attempt to quit: 12/22/1996    Years since quitting: 20.9    . Smokeless tobacco: Never Used  Substance Use Topics  . Alcohol use: No  . Drug use: No    Review of Systems  Constitutional: No fever/chills. Positive intermittent lightheadedness and generalized weakness.  Eyes: No visual changes. ENT: No sore throat. Cardiovascular: Denies chest pain. Respiratory: Denies shortness of breath. Gastrointestinal: No abdominal pain.  Positive nausea, no vomiting.  No diarrhea.  No constipation. Genitourinary: Negative for dysuria. Musculoskeletal: Negative for back pain. Skin: Negative for rash. Neurological: Negative for focal weakness or numbness. Positive HA.   10-point ROS otherwise negative.  ____________________________________________   PHYSICAL EXAM:  VITAL SIGNS: ED Triage Vitals  Enc Vitals Group     BP 12/01/17 1940 (!) 190/66     Pulse Rate 12/01/17 1940 76     Resp 12/01/17 1940 16     Temp 12/01/17 1940 98.5 F (36.9 C)     Temp Source 12/01/17 1940 Oral     SpO2 12/01/17 1940 96 %     Weight 12/01/17 1940 153 lb (69.4 kg)     Height 12/01/17 1940 5\' 3"  (1.6 m)     Pain Score 12/01/17 1939 4    Constitutional: Alert and oriented. Well appearing and in no acute distress. Eyes: Conjunctivae are normal. PERRL. EOMI. Head: Atraumatic. Nose: No congestion/rhinnorhea. Mouth/Throat: Mucous membranes are moist.   Neck: No stridor.  Cardiovascular: Normal rate, regular rhythm. Good peripheral circulation. Grossly normal heart sounds.   Respiratory: Normal respiratory effort.  No retractions. Lungs CTAB. Gastrointestinal: Soft and nontender. No distention.  Musculoskeletal: No lower extremity tenderness nor edema. No gross deformities of extremities. Neurologic:  Normal speech and language. No gross focal neurologic deficits are appreciated. Normal CN exam 2-12. No pronator drift. Normal finger-to-nose. Normal heel-to-shin.  Skin:  Skin is warm, dry and intact. No rash noted.  ____________________________________________    LABS (all labs ordered are listed, but only abnormal results are displayed)  Labs Reviewed  BASIC METABOLIC PANEL - Abnormal; Notable for the following components:      Result Value   Potassium 2.9 (*)    Glucose, Bld 150 (*)    All other components within normal limits  URINALYSIS, ROUTINE W REFLEX MICROSCOPIC - Abnormal; Notable for the following components:   Color, Urine STRAW (*)    Specific Gravity, Urine 1.004 (*)    Leukocytes, UA TRACE (*)    Squamous Epithelial / LPF 0-5 (*)    All other components within normal limits  CBC  HEPATIC FUNCTION PANEL  I-STAT TROPONIN, ED   ____________________________________________  EKG   EKG Interpretation  Date/Time:  Tuesday December 01 2017 19:38:55 EST Ventricular Rate:  75 PR Interval:  158 QRS Duration: 84 QT Interval:  404  QTC Calculation: 451 R Axis:   52 Text Interpretation:  Normal sinus rhythm Nonspecific ST abnormality Abnormal ECG No STEMI.  Confirmed by Nanda Quinton [id number]) on 12/01/2017 9:12:09 PM       ____________________________________________  RADIOLOGY  Ct Head Wo Contrast  Result Date: 12/01/2017 CLINICAL DATA:  Chronic headache and dizziness.  Hypertension. EXAM: CT HEAD WITHOUT CONTRAST TECHNIQUE: Contiguous axial images were obtained from the base of the skull through the vertex without intravenous contrast. COMPARISON:  Head CT 01/12/2016 FINDINGS: Brain: No mass lesion, intraparenchymal hemorrhage or extra-axial collection. No evidence of acute cortical infarct. Brain parenchyma and CSF-containing spaces are normal for age. Vascular: No hyperdense vessel or unexpected calcification. Skull: Normal visualized skull base, calvarium and extracranial soft tissues. Sinuses/Orbits: No sinus fluid levels or advanced mucosal thickening. No mastoid effusion. Normal orbits. IMPRESSION: Normal head CT. Electronically Signed   By: Ulyses Jarred M.D.   On: 12/01/2017 22:07     ____________________________________________   PROCEDURES  Procedure(s) performed:   Procedures  None ____________________________________________   INITIAL IMPRESSION / ASSESSMENT AND PLAN / ED COURSE  Pertinent labs & imaging results that were available during my care of the patient were reviewed by me and considered in my medical decision making (see chart for details).  Patient presents to the emergency department for evaluation of intermittent lightheadedness, generalized/mild headache, elevated blood pressure, and nausea.  No chest pain or other anginal equivalents.  No symptoms currently.  Plan for troponin with severe symptoms today, CT scan of the head, labs, UA.  Patient blood pressure is elevated but no signs or symptoms on my exam to suggest hypertension emergency.  Her headache is mild to moderate with no sudden onset, maximal intensity symptoms.   Labs and imaging reviewed. Plan to increase Amlodipine to 10 mg daily. Plan for PCP and Cardiology follow up.   At this time, I do not feel there is any life-threatening condition present. I have reviewed and discussed all results (EKG, imaging, lab, urine as appropriate), exam findings with patient. I have reviewed nursing notes and appropriate previous records.  I feel the patient is safe to be discharged home without further emergent workup. Discussed usual and customary return precautions. Patient and family (if present) verbalize understanding and are comfortable with this plan.  Patient will follow-up with their primary care provider. If they do not have a primary care provider, information for follow-up has been provided to them. All questions have been answered.  ____________________________________________  FINAL CLINICAL IMPRESSION(S) / ED DIAGNOSES  Final diagnoses:  Lightheadedness  Essential hypertension  Nausea     MEDICATIONS GIVEN DURING THIS VISIT:  Medications  ondansetron (ZOFRAN) injection 4 mg  (4 mg Intravenous Given 12/01/17 2129)  amLODipine (NORVASC) tablet 5 mg (5 mg Oral Given 12/01/17 2310)    Note:  This document was prepared using Dragon voice recognition software and may include unintentional dictation errors.  Nanda Quinton, MD Emergency Medicine    Long, Wonda [facility], MD 12/01/17 2350

## 2017-12-01 NOTE — Discharge Instructions (Signed)
As we discussed, though you do have high blood pressure (hypertension), fortunately it is not immediately dangerous at this time and does not need emergency intervention or admission to the hospital. We are making a change to your Amlodipine medication which you should review with your PCP ASAP. Please follow up in clinic as recommended in these papers.    Return to the Emergency Department (ED) if you experience any worsening chest pain/pressure/tightness, difficulty breathing, or sudden sweating, or other symptoms that concern you.   Hypertension Hypertension, commonly called high blood pressure, is when the force of blood pumping through your arteries is too strong. Your arteries are the blood vessels that carry blood from your heart throughout your body. A blood pressure reading consists of a higher number over a lower number, such as 110/72. The higher number (systolic) is the pressure inside your arteries when your heart pumps. The lower number (diastolic) is the pressure inside your arteries when your heart relaxes. Ideally you want your blood pressure below 120/80. Hypertension forces your heart to work harder to pump blood. Your arteries may become narrow or stiff. Having hypertension puts you at risk for heart disease, stroke, and other problems.  RISK FACTORS Some risk factors for high blood pressure are controllable. Others are not.  Risk factors you cannot control include:  Race. You may be at higher risk if you are African American. Age. Risk increases with age. Gender. Men are at higher risk than women before age 52 years. After age 50, women are at higher risk than men. Risk factors you can control include: Not getting enough exercise or physical activity. Being overweight. Getting too much fat, sugar, calories, or salt in your diet. Drinking too much alcohol. SIGNS AND SYMPTOMS Hypertension does not usually cause signs or symptoms. Extremely high blood pressure (hypertensive  crisis) may cause headache, anxiety, shortness of breath, and nosebleed. DIAGNOSIS  To check if you have hypertension, your health care provider will measure your blood pressure while you are seated, with your arm held at the level of your heart. It should be measured at least twice using the same arm. Certain conditions can cause a difference in blood pressure between your right and left arms. A blood pressure reading that is higher than normal on one occasion does not mean that you need treatment. If one blood pressure reading is high, ask your health care provider about having it checked again. TREATMENT  Treating high blood pressure includes making lifestyle changes and possibly taking medicine. Living a healthy lifestyle can help lower high blood pressure. You may need to change some of your habits. Lifestyle changes may include: Following the DASH diet. This diet is high in fruits, vegetables, and whole grains. It is low in salt, red meat, and added sugars. Getting at least 2 hours of brisk physical activity every week. Losing weight if necessary. Not smoking. Limiting alcoholic beverages. Learning ways to reduce stress.  If lifestyle changes are not enough to get your blood pressure under control, your health care provider may prescribe medicine. You may need to take more than one. Work closely with your health care provider to understand the risks and benefits. HOME CARE INSTRUCTIONS Have your blood pressure rechecked as directed by your health care provider.   Take medicines only as directed by your health care provider. Follow the directions carefully. Blood pressure medicines must be taken as prescribed. The medicine does not work as well when you skip doses. Skipping doses also puts you at  risk for problems.   Do not smoke.   Monitor your blood pressure at home as directed by your health care provider.  SEEK MEDICAL CARE IF:  You think you are having a reaction to medicines  taken. You have recurrent headaches or feel dizzy. You have swelling in your ankles. You have trouble with your vision. SEEK IMMEDIATE MEDICAL CARE IF: You develop a severe headache or confusion. You have unusual weakness, numbness, or feel faint. You have severe chest or abdominal pain. You vomit repeatedly. You have trouble breathing. MAKE SURE YOU:  Understand these instructions. Will watch your condition. Will get help right away if you are not doing well or get worse. Document Released: 12/08/2005 Document Revised: 04/24/2014 Document Reviewed: 09/30/2013 [facility] Patient Information 2015 [place], [place]. This information is not intended to replace advice given to you by your health care provider. Make sure you discuss any questions you have with your health care provider.  How to Take Your Blood Pressure HOW DO I GET A BLOOD PRESSURE MACHINE? You can buy an electronic home blood pressure machine at your local pharmacy. Insurance will sometimes cover the cost if you have a prescription. Ask your doctor what type of machine is best for you. There are different machines for your arm and your wrist. If you decide to buy a machine to check your blood pressure on your arm, first check the size of your arm so you can buy the right size cuff. To check the size of your arm:   Use a measuring tape that shows both inches and centimeters.   Wrap the measuring tape around the upper-middle part of your arm. You may need someone to help you measure.   Write down your arm measurement in both inches and centimeters.   To measure your blood pressure correctly, it is important to have the right size cuff.   If your arm is up to 13 inches (up to 34 centimeters), get an adult cuff size. If your arm is 13 to 17 inches (35 to 44 centimeters), get a large adult cuff size.    If your arm is 17 to 20 inches (45 to 52 centimeters), get an adult thigh cuff.   WHAT DO THE NUMBERS MEAN?  There are two numbers  that make up your blood pressure. For example: 120/80. The first number (120 in our example) is called the "systolic pressure." It is a measure of the pressure in your blood vessels when your heart is pumping blood. The second number (80 in our example) is called the "diastolic pressure." It is a measure of the pressure in your blood vessels when your heart is resting between beats. Your doctor will tell you what your blood pressure should be. WHAT SHOULD I DO BEFORE I CHECK MY BLOOD PRESSURE?  Try to rest or relax for at least 30 minutes before you check your blood pressure. Do not smoke. Do not have any drinks with caffeine, such as: Soda. Coffee. Tea. Check your blood pressure in a quiet room. Sit down and stretch out your arm on a table. Keep your arm at about the level of your heart. Let your arm relax. Make sure that your legs are not crossed. HOW DO I CHECK MY BLOOD PRESSURE? Follow the directions that came with your machine. Make sure you remove any tight-fighting clothing from your arm or wrist. Wrap the cuff around your upper arm or wrist. You should be able to fit a finger between the cuff and  your arm. If you cannot fit a finger between the cuff and your arm, it is too tight and should be removed and rewrapped. Some units require you to manually pump up the arm cuff. Automatic units inflate the cuff when you press a button. Cuff deflation is automatic in both models. After the cuff is inflated, the unit measures your blood pressure and pulse. The readings are shown on a monitor. Hold still and breathe normally while the cuff is inflated. Getting a reading takes less than a minute. Some models store readings in a memory. Some provide a printout of readings. If your machine does not store your readings, keep a written record. Take readings with you to your next visit with your doctor. Document Released: 11/20/2008 Document Revised: 04/24/2014 Document Reviewed: 02/02/2014 [facility]  Patient Information 2015 [place], [place]. This information is not intended to replace advice given to you by your health care provider. Make sure you discuss any questions you have with your health care provider.

## 2017-12-01 NOTE — ED Notes (Signed)
PT WAS TAKEN TO THE WAITING ROOM DUE TO [place] NOT HAVING ANY ROOMS AVAILABLE AT THIS TIME. PT BECAME VERY NAUSEATED AND STATED, "I JUST DON'T FEEL WELL."

## 2017-12-01 NOTE — ED Triage Notes (Addendum)
Pt reports being here this past Friday. Pt states her symptoms haven't stopped. Pt reports dizziness, headache, high blood pressure, and nausea. Pt reports dizziness and weakness x 2 weeks.

## 2017-12-02 ENCOUNTER — Encounter: Payer: Self-pay | Admitting: Family Medicine

## 2017-12-02 ENCOUNTER — Ambulatory Visit (INDEPENDENT_AMBULATORY_CARE_PROVIDER_SITE_OTHER): Payer: Medicare Other | Admitting: Family Medicine

## 2017-12-02 ENCOUNTER — Other Ambulatory Visit: Payer: Self-pay

## 2017-12-02 VITALS — BP 174/70 | HR 84 | Temp 97.7°F | Resp 18 | Wt 152.0 lb

## 2017-12-02 DIAGNOSIS — I160 Hypertensive urgency: Secondary | ICD-10-CM

## 2017-12-02 DIAGNOSIS — I6523 Occlusion and stenosis of bilateral carotid arteries: Secondary | ICD-10-CM

## 2017-12-02 DIAGNOSIS — R7309 Other abnormal glucose: Secondary | ICD-10-CM | POA: Diagnosis not present

## 2017-12-02 DIAGNOSIS — I998 Other disorder of circulatory system: Secondary | ICD-10-CM

## 2017-12-02 MED ORDER — CLONIDINE HCL 0.1 MG PO TABS: 0.1000 mg | ORAL_TABLET | Freq: Three times a day (TID) | ORAL | 3 refills | Status: DC

## 2017-12-02 NOTE — Telephone Encounter (Signed)
Medication refilled per protocol. 

## 2017-12-02 NOTE — Progress Notes (Signed)
Subjective:    Patient ID: Stacey Moody, female    DOB: 1946/11/27, 71 y.o.   MRN: [id number]  HPI Over the last month, the patient has had fluctuating blood pressures.  Some days, her systolic blood pressure can be as low as 112.  Other days her systolic blood pressure can be as high as 230.  She brings in more than 20 readings.  The vast majority range between 937 and 342 systolic.  She has occasional spikes that will go over 876 systolic.  She is taking losartan 100 mg a day, hydrochlorothiazide 25 mg a day, and amlodipine but she is only taking 5 mg a day.  The emergency room doctor last night increased it to 10 mg a day but she is yet to do that.  She reports episodes of tachycardia and headaches.  She denies any anxiety or drug use.  She denies any symptoms of sleep apnea.  She denies any weight loss or heat intolerance or symptoms of thyroid dysfunction. Past Medical History:  Diagnosis Date  . Carotid artery occlusion   . GERD (gastroesophageal reflux disease)   . Hyperlipidemia   . Hypertension   . Influenza A 01/01/2014  . Osteopenia    T-2.3  . Syncope 12/31/2013   Vasovagal or hypovolemia/orthostatic   Past Surgical History:  Procedure Laterality Date  . ABDOMINAL HYSTERECTOMY  1982   Partial hysterectomy  . COLONOSCOPY N/A 07/30/2016   Procedure: COLONOSCOPY;  Surgeon: Daneil Dolin, MD;  [facility];  Service: Endoscopy;  Laterality: N/A;  8:30 AM  . CT SCAN  July 2015   Chest  . POLYPECTOMY  07/30/2016   Procedure: POLYPECTOMY;  Surgeon: Daneil Dolin, MD;  [facility];  Service: Endoscopy;;  descending colon  . SMALL INTESTINE SURGERY     Current Outpatient Medications on File Prior to Visit  Medication Sig Dispense Refill  . amLODipine (NORVASC) 10 MG tablet Take 1 tablet (10 mg total) by mouth daily. 30 tablet 0  . aspirin 325 MG tablet Take 325 mg by mouth at bedtime.     . cholecalciferol (VITAMIN D) 1000 UNITS tablet Take 1,000 Units by  mouth daily.    . Cyanocobalamin (VITAMIN B 12 PO) Take 1,000 mg by mouth at bedtime.     . fluticasone (FLONASE) 50 MCG/ACT nasal spray Place 2 sprays into both nostrils daily. (Patient taking differently: Place 2 sprays into both nostrils daily as needed for allergies. ) 16 g 6  . hydrochlorothiazide (HYDRODIURIL) 25 MG tablet TAKE 1 TABLET BY MOUTH EVERY DAY 30 tablet 0  . losartan (COZAAR) 100 MG tablet Take 1 tablet (100 mg total) by mouth daily. 90 tablet 3  . meclizine (ANTIVERT) 25 MG tablet Take 1 tablet (25 mg total) by mouth every 8 (eight) hours. 30 tablet 1  . ondansetron (ZOFRAN ODT) 4 MG disintegrating tablet Take 1 tablet (4 mg total) by mouth every 8 (eight) hours as needed for nausea or vomiting. 20 tablet 0  . rosuvastatin (CRESTOR) 20 MG tablet TAKE 1 TABLET BY MOUTH EVERY DAY 90 tablet 0  . [DISCONTINUED] doxazosin (CARDURA) 4 MG tablet Take 1 tablet (4 mg total) by mouth daily. (Patient not taking: Reported on 12/29/2014) 30 tablet 3   No current facility-administered medications on file prior to visit.    Allergies  Allergen Reactions  . Tetanus Toxoids Swelling   Social History   Socioeconomic History  . Marital status: Widowed  Spouse name: Not on file  . Number of children: Not on file  . Years of education: Not on file  . Highest education level: Not on file  Social Needs  . Financial resource strain: Not on file  . Food insecurity - worry: Not on file  . Food insecurity - inability: Not on file  . Transportation needs - medical: Not on file  . Transportation needs - non-medical: Not on file  Occupational History  . Not on file  Tobacco Use  . Smoking status: Former Smoker    Last attempt to quit: 12/22/1996    Years since quitting: 20.9  . Smokeless tobacco: Never Used  Substance and Sexual Activity  . Alcohol use: No  . Drug use: No  . Sexual activity: Not on file  Other Topics Concern  . Not on file  Social History Narrative  . Not on file       Review of Systems  All other systems reviewed and are negative.      Objective:   Physical Exam  Constitutional: She appears well-developed and well-nourished.  Cardiovascular: Normal rate, regular rhythm and normal heart sounds.  No murmur heard. Pulmonary/Chest: Effort normal and breath sounds normal. No respiratory distress. She has no wheezes. She has no rales.  Abdominal: Soft. Bowel sounds are normal. She exhibits no distension. There is no tenderness. There is no rebound and no guarding.  Vitals reviewed.         Assessment & Plan:  Hypertensive urgency - Plan: Metanephrines, urine, 24 hour, Catecholamines, fractionated, urine, 24 hour, Basic Metabolic Panel, US Renal Artery Stenosis  Fluctuating blood pressure - Plan: US Renal Artery Stenosis  Check BMP to monitor potassium levels for signs of hyperaldosteronism such as Conn syndrome.  Check renal artery ultrasound to rule out renal artery stenosis as a cause of hypertensive urgency.  Check 24-hour urine catecholamines and metanephrines to rule out pheochromocytoma given the fluctuating blood sugars.  Meanwhile start the patient on clonidine 0.1 mg p.o. 3 times daily as needed.  She is to take the clonidine if her blood pressure is elevated greater than 161 systolic.  If less, she is not to take the medication.  This should help given its short half-life regulate her blood pressure until we have the above-mentioned studies.  Once we have ruled out secondary causes of hypertension, we will reassess the patient's blood pressure control and also the frequency of clonidine use to determine a better long-term option that is more convenient.  Recheck blood pressure next week

## 2017-12-02 NOTE — Telephone Encounter (Signed)
Pt was called and given appt for today to be seen per provider instructions.

## 2017-12-04 ENCOUNTER — Other Ambulatory Visit: Payer: Medicare Other

## 2017-12-04 DIAGNOSIS — I160 Hypertensive urgency: Secondary | ICD-10-CM | POA: Diagnosis not present

## 2017-12-05 LAB — BASIC METABOLIC PANEL
BUN: 11 mg/dL (ref 7–25)
CALCIUM: 9.5 mg/dL (ref 8.6–10.4)
CHLORIDE: 101 mmol/L (ref 98–110)
CO2: 28 mmol/L (ref 20–32)
Creat: 0.78 mg/dL (ref 0.60–0.93)
Glucose, Bld: 240 mg/dL — ABNORMAL HIGH (ref 65–99)
POTASSIUM: 3.6 mmol/L (ref 3.5–5.3)
Sodium: 139 mmol/L (ref 135–146)

## 2017-12-05 LAB — TEST AUTHORIZATION

## 2017-12-05 LAB — EXTRA LAV TOP TUBE

## 2017-12-05 LAB — HEMOGLOBIN A1C: Hgb A1c MFr Bld: 5.9 %{Hb} — ABNORMAL HIGH (ref <5.7)

## 2017-12-07 ENCOUNTER — Other Ambulatory Visit: Payer: Self-pay | Admitting: Family Medicine

## 2017-12-07 ENCOUNTER — Encounter: Payer: Self-pay | Admitting: Family Medicine

## 2017-12-07 DIAGNOSIS — I160 Hypertensive urgency: Secondary | ICD-10-CM

## 2017-12-08 ENCOUNTER — Encounter: Payer: Self-pay | Admitting: Family Medicine

## 2017-12-08 ENCOUNTER — Ambulatory Visit (INDEPENDENT_AMBULATORY_CARE_PROVIDER_SITE_OTHER): Payer: Medicare Other | Admitting: Family Medicine

## 2017-12-08 VITALS — BP 144/86 | HR 66 | Temp 98.0°F | Ht 64.0 in | Wt 153.0 lb

## 2017-12-08 DIAGNOSIS — I10 Essential (primary) hypertension: Secondary | ICD-10-CM

## 2017-12-08 DIAGNOSIS — I6523 Occlusion and stenosis of bilateral carotid arteries: Secondary | ICD-10-CM | POA: Diagnosis not present

## 2017-12-08 NOTE — Progress Notes (Signed)
Subjective:    Patient ID: Stacey Moody, female    DOB: 23-Jul-1946, 71 y.o.   MRN: [id number]  HPI  12/02/17 Over the last month, the patient has had fluctuating blood pressures.  Some days, her systolic blood pressure can be as low as 112.  Other days her systolic blood pressure can be as high as 230.  She brings in more than 20 readings.  The vast majority range between 106 and 269 systolic.  She has occasional spikes that will go over 485 systolic.  She is taking losartan 100 mg a day, hydrochlorothiazide 25 mg a day, and amlodipine but she is only taking 5 mg a day.  The emergency room doctor last night increased it to 10 mg a day but she is yet to do that.  She reports episodes of tachycardia and headaches.  She denies any anxiety or drug use.  She denies any symptoms of sleep apnea.  She denies any weight loss or heat intolerance or symptoms of thyroid dysfunction.  At that time, my plan was: Check BMP to monitor potassium levels for signs of hyperaldosteronism such as Conn syndrome.  Check renal artery ultrasound to rule out renal artery stenosis as a cause of hypertensive urgency.  Check 24-hour urine catecholamines and metanephrines to rule out pheochromocytoma given the fluctuating blood sugars.  Meanwhile start the patient on clonidine 0.1 mg p.o. 3 times daily as needed.  She is to take the clonidine if her blood pressure is elevated greater than 462 systolic.  If less, she is not to take the medication.  This should help given its short half-life regulate her blood pressure until we have the above-mentioned studies.  Once we have ruled out secondary causes of hypertension, we will reassess the patient's blood pressure control and also the frequency of clonidine use to determine a better long-term option that is more convenient.  Recheck blood pressure next week  12/08/17 Patient has seen no further hypertensive urgency since her last visit.  She has not required clonidine.  Her systolic blood  pressures averaging between 140 and 160/80-90.  She denies any palpitations, headache, chest tightness, shortness of breath.  Ultrasound is scheduled for later this week to evaluate for renal artery stenosis.  Urine test for pheochromocytoma is pending. Past Medical History:  Diagnosis Date  . Carotid artery occlusion   . GERD (gastroesophageal reflux disease)   . Hyperlipidemia   . Hypertension   . Influenza A 01/01/2014  . Osteopenia    T-2.3  . Syncope 12/31/2013   Vasovagal or hypovolemia/orthostatic   Past Surgical History:  Procedure Laterality Date  . ABDOMINAL HYSTERECTOMY  1982   Partial hysterectomy  . COLONOSCOPY N/A 07/30/2016   Procedure: COLONOSCOPY;  Surgeon: Daneil Dolin, MD;  Location: [facility];  Service: Endoscopy;  Laterality: N/A;  8:30 AM  . CT SCAN  July 2015   Chest  . POLYPECTOMY  07/30/2016   Procedure: POLYPECTOMY;  Surgeon: Daneil Dolin, MD;  Location: [facility];  Service: Endoscopy;;  descending colon  . SMALL INTESTINE SURGERY     Current Outpatient Medications on File Prior to Visit  Medication Sig Dispense Refill  . amLODipine (NORVASC) 10 MG tablet Take 1 tablet (10 mg total) by mouth daily. (Patient taking differently: Take 5 mg by mouth daily. ) 30 tablet 0  . aspirin 325 MG tablet Take 325 mg by mouth at bedtime.     . cholecalciferol (VITAMIN D) 1000 UNITS tablet  Take 1,000 Units by mouth daily.    . cloNIDine (CATAPRES) 0.1 MG tablet Take 1 tablet (0.1 mg total) by mouth 3 (three) times daily. 90 tablet 3  . Cyanocobalamin (VITAMIN B 12 PO) Take 1,000 mg by mouth at bedtime.     . fluticasone (FLONASE) 50 MCG/ACT nasal spray Place 2 sprays into both nostrils daily. (Patient taking differently: Place 2 sprays into both nostrils daily as needed for allergies. ) 16 g 6  . hydrochlorothiazide (HYDRODIURIL) 25 MG tablet TAKE 1 TABLET BY MOUTH EVERY DAY 30 tablet 0  . losartan (COZAAR) 100 MG tablet Take 1 tablet (100 mg total) by mouth daily.  90 tablet 3  . meclizine (ANTIVERT) 25 MG tablet Take 1 tablet (25 mg total) by mouth every 8 (eight) hours. 30 tablet 1  . ondansetron (ZOFRAN ODT) 4 MG disintegrating tablet Take 1 tablet (4 mg total) by mouth every 8 (eight) hours as needed for nausea or vomiting. 20 tablet 0  . rosuvastatin (CRESTOR) 20 MG tablet TAKE 1 TABLET BY MOUTH EVERY DAY 90 tablet 0  . [DISCONTINUED] doxazosin (CARDURA) 4 MG tablet Take 1 tablet (4 mg total) by mouth daily. (Patient not taking: Reported on 12/29/2014) 30 tablet 3   No current facility-administered medications on file prior to visit.    Allergies  Allergen Reactions  . Tetanus Toxoids Swelling   Social History   Socioeconomic History  . Marital status: Widowed    Spouse name: Not on file  . Number of children: Not on file  . Years of education: Not on file  . Highest education level: Not on file  Social Needs  . Financial resource strain: Not on file  . Food insecurity - worry: Not on file  . Food insecurity - inability: Not on file  . Transportation needs - medical: Not on file  . Transportation needs - non-medical: Not on file  Occupational History  . Not on file  Tobacco Use  . Smoking status: Former Smoker    Last attempt to quit: 12/22/1996    Years since quitting: 20.9  . Smokeless tobacco: Never Used  Substance and Sexual Activity  . Alcohol use: No  . Drug use: No  . Sexual activity: Not on file  Other Topics Concern  . Not on file  Social History Narrative  . Not on file      Review of Systems  All other systems reviewed and are negative.      Objective:   Physical Exam  Constitutional: She appears well-developed and well-nourished.  Cardiovascular: Normal rate, regular rhythm and normal heart sounds.  No murmur heard. Pulmonary/Chest: Effort normal and breath sounds normal. No respiratory distress. She has no wheezes. She has no rales.  Abdominal: Soft. Bowel sounds are normal. She exhibits no distension. There  is no tenderness. There is no rebound and no guarding.  Vitals reviewed.         Assessment & Plan:  Benign essential HTN  Add bystolic 10 mg a day.  I believe the pretest probability of a pheochromocytoma is essentially 0.  Therefore I am not concerned about a beta-blocker in this patient.  Recheck blood pressure in 2-3 weeks.  If blood pressure response dramatically, I will likely discontinue amlodipine.  Await results of renal artery ultrasound.

## 2017-12-09 ENCOUNTER — Ambulatory Visit (HOSPITAL_COMMUNITY): Admission: RE | Admit: 2017-12-09 | Payer: Medicare Other | Source: Ambulatory Visit

## 2017-12-10 ENCOUNTER — Ambulatory Visit: Payer: Medicare Other | Admitting: Family Medicine

## 2017-12-10 ENCOUNTER — Encounter: Payer: Self-pay | Admitting: Family Medicine

## 2017-12-10 LAB — CATECHOLAMINES, FRACTIONATED, URINE, 24 HOUR
CALCULATED TOTAL (E+ NE): 18 ug/(24.h) — AB (ref 26–121)
Creatinine, Urine mg/day-CATEUR: 0.69 g/(24.h) (ref 0.50–2.15)
DOPAMINE, 24 HR URINE: 99 ug/(24.h) (ref 52–480)
Epinephrine, 24 hr Urine: 5 mcg/24 h (ref 2–24)
NOREPINEPHRINE, 24 HR UR: 13 ug/(24.h) — AB (ref 15–100)
Volume, Urine-VMAUR: 900 mL

## 2017-12-10 LAB — METANEPHRINES, URINE, 24 HOUR
METANEPH TOTAL UR: 194 ug/(24.h) — AB (ref 224–832)
Metanephrines, Ur: 70 mcg/24 h — ABNORMAL LOW (ref 90–315)
Normetanephrine, 24H Ur: 124 mcg/24 h (ref 122–676)
VOLUME, URINE-VMAUR: 900 mL

## 2017-12-11 ENCOUNTER — Ambulatory Visit
Admission: RE | Admit: 2017-12-11 | Discharge: 2017-12-11 | Disposition: A | Discharge status: Home / Self Care | Payer: Medicare Other | Source: Ambulatory Visit | Attending: Family Medicine | Admitting: Family Medicine

## 2017-12-11 DIAGNOSIS — I160 Hypertensive urgency: Secondary | ICD-10-CM | POA: Diagnosis not present

## 2017-12-11 DIAGNOSIS — N261 Atrophy of kidney (terminal): Secondary | ICD-10-CM | POA: Diagnosis not present

## 2017-12-14 ENCOUNTER — Encounter: Payer: Self-pay | Admitting: Family Medicine

## 2017-12-18 ENCOUNTER — Ambulatory Visit (INDEPENDENT_AMBULATORY_CARE_PROVIDER_SITE_OTHER): Payer: Medicare Other | Admitting: Family Medicine

## 2017-12-18 ENCOUNTER — Encounter: Payer: Self-pay | Admitting: Family Medicine

## 2017-12-18 VITALS — BP 172/60 | HR 70 | Temp 98.1°F | Resp 16 | Ht 64.0 in | Wt 154.0 lb

## 2017-12-18 DIAGNOSIS — I701 Atherosclerosis of renal artery: Secondary | ICD-10-CM | POA: Diagnosis not present

## 2017-12-18 DIAGNOSIS — N3289 Other specified disorders of bladder: Secondary | ICD-10-CM

## 2017-12-18 DIAGNOSIS — I10 Essential (primary) hypertension: Secondary | ICD-10-CM | POA: Diagnosis not present

## 2017-12-18 NOTE — Progress Notes (Signed)
Subjective:    Patient ID: Stacey Moody, female    DOB: Oct 31, 1946, 71 y.o.   MRN: [id number]  HPI  12/02/17 Over the last month, the patient has had fluctuating blood pressures.  Some days, her systolic blood pressure can be as low as 112.  Other days her systolic blood pressure can be as high as 230.  She brings in more than 20 readings.  The vast majority range between 267 and 124 systolic.  She has occasional spikes that will go over 580 systolic.  She is taking losartan 100 mg a day, hydrochlorothiazide 25 mg a day, and amlodipine but she is only taking 5 mg a day.  The emergency room doctor last night increased it to 10 mg a day but she is yet to do that.  She reports episodes of tachycardia and headaches.  She denies any anxiety or drug use.  She denies any symptoms of sleep apnea.  She denies any weight loss or heat intolerance or symptoms of thyroid dysfunction.  At that time, my plan was: Check BMP to monitor potassium levels for signs of hyperaldosteronism such as Conn syndrome.  Check renal artery ultrasound to rule out renal artery stenosis as a cause of hypertensive urgency.  Check 24-hour urine catecholamines and metanephrines to rule out pheochromocytoma given the fluctuating blood sugars.  Meanwhile start the patient on clonidine 0.1 mg p.o. 3 times daily as needed.  She is to take the clonidine if her blood pressure is elevated greater than 998 systolic.  If less, she is not to take the medication.  This should help given its short half-life regulate her blood pressure until we have the above-mentioned studies.  Once we have ruled out secondary causes of hypertension, we will reassess the patient's blood pressure control and also the frequency of clonidine use to determine a better long-term option that is more convenient.  Recheck blood pressure next week  12/08/17 Patient has seen no further hypertensive urgency since her last visit.  She has not required clonidine.  Her systolic blood  pressures averaging between 140 and 160/80-90.  She denies any palpitations, headache, chest tightness, shortness of breath.  Ultrasound is scheduled for later this week to evaluate for renal artery stenosis.  Urine test for pheochromocytoma is pending.  At that time, my plan was: Add bystolic 10 mg a day.  I believe the pretest probability of a pheochromocytoma is essentially 0.  Therefore I am not concerned about a beta-blocker in this patient.  Recheck blood pressure in 2-3 weeks.  If blood pressure response dramatically, I will likely discontinue amlodipine.  Await results of renal artery ultrasound.  12/18/17 The patient's urine test showed no evidence of a pheochromocytoma. The results of her renal ultrasound are listed below: 1. Borderline elevated peak systolic velocities are seen involving the origin of the right renal artery as well as the mid aspect of the left renal artery. This finding is associated with mild asymmetric atrophy of the right kidney in comparison to the left. Constellation of findings are worrisome for renal artery stenosis. Further evaluation with CTA could be performed as clinically indicated. 2. No evidence urinary obstruction. 3. An incidental finding of potential clinical significance has been found. Nodular echogenic wall thickening involving the left side of the base of the urinary bladder, nonspecific with differential considerations including echogenic debris within the urinary bladder, but and diluted diverticulum versus a discrete bladder mass. Again, this could be further evaluated at the time of  CTA however ultimately urinalysis/cytology and cystoscopy may be warranted.   As stated above, there are findings consistent with possible right renal artery stenosis. There was also a coincidental finding of a nodular thickening in the left side of the urinary bladder. She is here today to discuss this further. Blood pressures at home and been averaging between  125 and 145/60-78. The patient has not even had to start bystolic.  She is only taking amlodipine and losartan. She is not having to use clonidine. Therefore her blood pressure has responded dramatically to amlodipine and is now well within normal range Past Medical History:  Diagnosis Date  . Carotid artery occlusion   . GERD (gastroesophageal reflux disease)   . Hyperlipidemia   . Hypertension   . Influenza A 01/01/2014  . Osteopenia    T-2.3  . Syncope 12/31/2013   Vasovagal or hypovolemia/orthostatic   Past Surgical History:  Procedure Laterality Date  . ABDOMINAL HYSTERECTOMY  1982   Partial hysterectomy  . COLONOSCOPY N/A 07/30/2016   Procedure: COLONOSCOPY;  Surgeon: Daneil Dolin, MD;  Location: [facility];  Service: Endoscopy;  Laterality: N/A;  8:30 AM  . CT SCAN  July 2015   Chest  . POLYPECTOMY  07/30/2016   Procedure: POLYPECTOMY;  Surgeon: Daneil Dolin, MD;  Location: [facility];  Service: Endoscopy;;  descending colon  . SMALL INTESTINE SURGERY     Current Outpatient Medications on File Prior to Visit  Medication Sig Dispense Refill  . amLODipine (NORVASC) 10 MG tablet Take 1 tablet (10 mg total) by mouth daily. (Patient taking differently: Take 5 mg by mouth daily. ) 30 tablet 0  . aspirin 325 MG tablet Take 325 mg by mouth at bedtime.     . cholecalciferol (VITAMIN D) 1000 UNITS tablet Take 1,000 Units by mouth daily.    . Cyanocobalamin (VITAMIN B 12 PO) Take 1,000 mg by mouth at bedtime.     . fluticasone (FLONASE) 50 MCG/ACT nasal spray Place 2 sprays into both nostrils daily. (Patient taking differently: Place 2 sprays into both nostrils daily as needed for allergies. ) 16 g 6  . hydrochlorothiazide (HYDRODIURIL) 25 MG tablet TAKE 1 TABLET BY MOUTH EVERY DAY 30 tablet 0  . losartan (COZAAR) 100 MG tablet Take 1 tablet (100 mg total) by mouth daily. 90 tablet 3  . meclizine (ANTIVERT) 25 MG tablet Take 1 tablet (25 mg total) by mouth every 8 (eight) hours. 30  tablet 1  . ondansetron (ZOFRAN ODT) 4 MG disintegrating tablet Take 1 tablet (4 mg total) by mouth every 8 (eight) hours as needed for nausea or vomiting. 20 tablet 0  . rosuvastatin (CRESTOR) 20 MG tablet TAKE 1 TABLET BY MOUTH EVERY DAY 90 tablet 0  . cloNIDine (CATAPRES) 0.1 MG tablet Take 1 tablet (0.1 mg total) by mouth 3 (three) times daily. (Patient not taking: Reported on 12/18/2017) 90 tablet 3  . [DISCONTINUED] doxazosin (CARDURA) 4 MG tablet Take 1 tablet (4 mg total) by mouth daily. (Patient not taking: Reported on 12/29/2014) 30 tablet 3   No current facility-administered medications on file prior to visit.    Allergies  Allergen Reactions  . Tetanus Toxoids Swelling   Social History   Socioeconomic History  . Marital status: Widowed    Spouse name: Not on file  . Number of children: Not on file  . Years of education: Not on file  . Highest education level: Not on file  Social Needs  .  Financial resource strain: Not on file  . Food insecurity - worry: Not on file  . Food insecurity - inability: Not on file  . Transportation needs - medical: Not on file  . Transportation needs - non-medical: Not on file  Occupational History  . Not on file  Tobacco Use  . Smoking status: Former Smoker    Last attempt to quit: 12/22/1996    Years since quitting: 21.0  . Smokeless tobacco: Never Used  Substance and Sexual Activity  . Alcohol use: No  . Drug use: No  . Sexual activity: Not on file  Other Topics Concern  . Not on file  Social History Narrative  . Not on file      Review of Systems  All other systems reviewed and are negative.      Objective:   Physical Exam  Constitutional: She appears well-developed and well-nourished.  Cardiovascular: Normal rate, regular rhythm and normal heart sounds.  No murmur heard. Pulmonary/Chest: Effort normal and breath sounds normal. No respiratory distress. She has no wheezes. She has no rales.  Abdominal: Soft. Bowel sounds  are normal. She exhibits no distension. There is no tenderness. There is no rebound and no guarding.  Vitals reviewed.         Assessment & Plan:  Bladder mass - Plan: Ambulatory referral to Urology  Benign essential HTN  Right renal artery stenosis (HCC)  Her blood pressure at home is excellent. We discussed her options including meeting with cardiology to discuss renal artery stenosis stenting versus clinical monitoring. Her blood pressure is well controlled at the present time. Therefore I do not see an indication to warrant stenting of the renal artery to improve her blood pressure. Furthermore her glomerular filtration rate is excellent. Therefore we have elected to monitor this every 4 months I will consult urology for cystoscopy to evaluate the mass seen in the renal bladder further. I believe this is likely a false positive. I tried to reassure the patient is much as possible.  Her blood pressure here today is elevated however she is extremely nervous discussed the mass on the bladder that was seen on her renal ultrasound. Her blood pressures at home have been well controlled

## 2017-12-20 ENCOUNTER — Encounter: Payer: Self-pay | Admitting: Family Medicine

## 2018-01-11 ENCOUNTER — Other Ambulatory Visit: Payer: Self-pay | Admitting: Family Medicine

## 2018-01-11 DIAGNOSIS — Z1231 Encounter for screening mammogram for malignant neoplasm of breast: Secondary | ICD-10-CM

## 2018-01-15 ENCOUNTER — Ambulatory Visit (HOSPITAL_COMMUNITY)
Admission: RE | Admit: 2018-01-15 | Discharge: 2018-01-15 | Disposition: A | Discharge status: Home / Self Care | Payer: Medicare Other | Source: Ambulatory Visit | Attending: Family Medicine | Admitting: Family Medicine

## 2018-01-15 DIAGNOSIS — Z1231 Encounter for screening mammogram for malignant neoplasm of breast: Secondary | ICD-10-CM | POA: Diagnosis not present

## 2018-02-10 ENCOUNTER — Ambulatory Visit (INDEPENDENT_AMBULATORY_CARE_PROVIDER_SITE_OTHER): Payer: Medicare Other | Admitting: Urology

## 2018-02-10 DIAGNOSIS — N329 Bladder disorder, unspecified: Secondary | ICD-10-CM | POA: Diagnosis not present

## 2018-03-01 ENCOUNTER — Other Ambulatory Visit: Payer: Self-pay | Admitting: Family Medicine

## 2018-03-05 ENCOUNTER — Ambulatory Visit (INDEPENDENT_AMBULATORY_CARE_PROVIDER_SITE_OTHER): Payer: Medicare Other | Admitting: Family Medicine

## 2018-03-05 ENCOUNTER — Other Ambulatory Visit: Payer: Self-pay | Admitting: Family Medicine

## 2018-03-05 ENCOUNTER — Encounter: Payer: Self-pay | Admitting: Family Medicine

## 2018-03-05 VITALS — BP 140/60 | HR 64 | Temp 97.6°F | Resp 14 | Ht 64.0 in | Wt 155.0 lb

## 2018-03-05 DIAGNOSIS — M7061 Trochanteric bursitis, right hip: Secondary | ICD-10-CM | POA: Diagnosis not present

## 2018-03-05 MED ORDER — DICLOFENAC SODIUM 75 MG PO TBEC: 75.0000 mg | DELAYED_RELEASE_TABLET | Freq: Two times a day (BID) | ORAL | 0 refills | Status: DC

## 2018-03-05 NOTE — Progress Notes (Signed)
Subjective:    Patient ID: Stacey Moody, female    DOB: [birth year], 72 y.o.   MRN: [id number]  HPI  Patient presents with approximately 1-2 weeks of right hip pain.  Pain is located over the right greater trochanter.  She is tender to palpation in that area.  It hurts to sleep on that part of her hip.  She denies any falls or injuries.  She also has some pain in her right gluteus radiating to the mid right thigh.  There is no pain with hip extension however resisted knee flexion elicits pain in the posterior hamstring making me suspect that this could be a muscle strain.  She has no pain with internal or external rotation or flexion of the hip.  She denies any falls or injuries. Past Medical History:  Diagnosis Date  . Carotid artery occlusion   . GERD (gastroesophageal reflux disease)   . Hyperlipidemia   . Hypertension   . Influenza A 01/01/2014  . Osteopenia    T-2.3  . Right renal artery stenosis ([place])   . Syncope 12/31/2013   Vasovagal or hypovolemia/orthostatic   Past Surgical History:  Procedure Laterality Date  . ABDOMINAL HYSTERECTOMY  1982   Partial hysterectomy  . COLONOSCOPY N/A 07/30/2016   Procedure: COLONOSCOPY;  Surgeon: Daneil Dolin, MD;  [facility]: [facility];  Service: Endoscopy;  Laterality: N/A;  8:30 AM  . CT SCAN  July 2015   Chest  . POLYPECTOMY  07/30/2016   Procedure: POLYPECTOMY;  Surgeon: Daneil Dolin, MD;  [facility]: [facility];  Service: Endoscopy;;  descending colon  . SMALL INTESTINE SURGERY     Current Outpatient Medications on File Prior to Visit  Medication Sig Dispense Refill  . amLODipine (NORVASC) 5 MG tablet TAKE 1 TABLET(5 MG) BY MOUTH DAILY 90 tablet 0  . aspirin 325 MG tablet Take 325 mg by mouth at bedtime.     . cholecalciferol (VITAMIN D) 1000 UNITS tablet Take 1,000 Units by mouth daily.    . fluticasone (FLONASE) 50 MCG/ACT nasal spray Place 2 sprays into both nostrils daily. (Patient taking differently: Place 2 sprays into both  nostrils daily as needed for allergies. ) 16 g 6  . hydrochlorothiazide (HYDRODIURIL) 25 MG tablet TAKE 1 TABLET BY MOUTH EVERY DAY 30 tablet 0  . losartan (COZAAR) 100 MG tablet Take 1 tablet (100 mg total) by mouth daily. 90 tablet 3  . meclizine (ANTIVERT) 25 MG tablet Take 1 tablet (25 mg total) by mouth every 8 (eight) hours. 30 tablet 1  . ondansetron (ZOFRAN ODT) 4 MG disintegrating tablet Take 1 tablet (4 mg total) by mouth every 8 (eight) hours as needed for nausea or vomiting. 20 tablet 0  . rosuvastatin (CRESTOR) 20 MG tablet TAKE 1 TABLET BY MOUTH EVERY DAY 90 tablet 0   No current facility-administered medications on file prior to visit.    Allergies  Allergen Reactions  . Tetanus Toxoids Swelling   Social History   Socioeconomic History  . Marital status: Widowed    Spouse name: Not on file  . Number of children: Not on file  . Years of education: Not on file  . Highest education level: Not on file  Social Needs  . Financial resource strain: Not on file  . Food insecurity - worry: Not on file  . Food insecurity - inability: Not on file  . Transportation needs - medical: Not on file  . Transportation needs - non-medical: Not  on file  Occupational History  . Not on file  Tobacco Use  . Smoking status: Former Smoker    Last attempt to quit: 12/22/1996    Years since quitting: 21.2  . Smokeless tobacco: Never Used  Substance and Sexual Activity  . Alcohol use: No  . Drug use: No  . Sexual activity: Not on file  Other Topics Concern  . Not on file  Social History Narrative  . Not on file     Review of Systems  All other systems reviewed and are negative.      Objective:   Physical Exam  Constitutional: She appears well-developed and well-nourished.  Cardiovascular: Normal rate, regular rhythm and normal heart sounds.  Pulmonary/Chest: Effort normal and breath sounds normal.  Musculoskeletal:       Right hip: She exhibits tenderness and bony tenderness.  She exhibits normal range of motion, normal strength, no swelling, no crepitus and no deformity.  Vitals reviewed.         Assessment & Plan:  Trochanteric bursitis of right hip  Exam is consistent with greater trochanteric bursitis.  We discussed NSAIDs versus cortisone injection.  Patient would like to try diclofenac 75 mg p.o. twice daily for up to 2 weeks.  If no better at that time, consider a cortisone injection as well as an x-ray of the hip to evaluate further

## 2018-03-24 ENCOUNTER — Other Ambulatory Visit: Payer: Self-pay | Admitting: Family Medicine

## 2018-05-27 ENCOUNTER — Encounter: Payer: Self-pay | Admitting: Family Medicine

## 2018-05-27 ENCOUNTER — Ambulatory Visit (INDEPENDENT_AMBULATORY_CARE_PROVIDER_SITE_OTHER): Payer: Medicare Other | Admitting: Family Medicine

## 2018-05-27 VITALS — BP 170/70 | HR 72 | Temp 97.9°F | Resp 14 | Ht 64.0 in | Wt 158.0 lb

## 2018-05-27 DIAGNOSIS — H8112 Benign paroxysmal vertigo, left ear: Secondary | ICD-10-CM | POA: Diagnosis not present

## 2018-05-27 NOTE — Progress Notes (Signed)
Subjective:    Patient ID: Stacey Moody, female    DOB: [birth year], 72 y.o.   MRN: [id number]  HPI  Patient states that her blood pressures at home have been outstanding.  They are usually in the 130s over 80s.  She states that she believes today's blood pressure is due to the fact she feels poorly.  She checks her blood pressure every day.  She states for the last 11 to 12 days, she has had episodes of vertigo.  The room will start to spin for no reason.  She will feel extremely dizzy and nauseated.  She denies any hearing loss.  She denies any tinnitus.  She denies any headache.  She denies any head trauma.  She denies any fevers or chills.  Examination of the tympanic membranes bilaterally is normal and there is no evidence of a sinus infection.  Dix-Hallpike maneuver is positive to the left side Past Medical History:  Diagnosis Date  . Carotid artery occlusion   . GERD (gastroesophageal reflux disease)   . Hyperlipidemia   . Hypertension   . Influenza A 01/01/2014  . Osteopenia    T-2.3  . Right renal artery stenosis ([place])   . Syncope 12/31/2013   Vasovagal or hypovolemia/orthostatic   Past Surgical History:  Procedure Laterality Date  . ABDOMINAL HYSTERECTOMY  1982   Partial hysterectomy  . COLONOSCOPY N/A 07/30/2016   Procedure: COLONOSCOPY;  Surgeon: Daneil Dolin, MD;  [facility];  Service: Endoscopy;  Laterality: N/A;  8:30 AM  . CT SCAN  July 2015   Chest  . POLYPECTOMY  07/30/2016   Procedure: POLYPECTOMY;  Surgeon: Daneil Dolin, MD;  [facility];  Service: Endoscopy;;  descending colon  . SMALL INTESTINE SURGERY     Current Outpatient Medications on File Prior to Visit  Medication Sig Dispense Refill  . amLODipine (NORVASC) 5 MG tablet TAKE 1 TABLET(5 MG) BY MOUTH DAILY 90 tablet 0  . aspirin EC 81 MG tablet Take 81 mg by mouth daily.    . cholecalciferol (VITAMIN D) 1000 UNITS tablet Take 1,000 Units by mouth daily.    . fluticasone (FLONASE) 50  MCG/ACT nasal spray Place 2 sprays into both nostrils daily. (Patient taking differently: Place 2 sprays into both nostrils daily as needed for allergies. ) 16 g 6  . hydrochlorothiazide (HYDRODIURIL) 25 MG tablet TAKE 1 TABLET BY MOUTH EVERY DAY 30 tablet 0  . losartan (COZAAR) 100 MG tablet Take 1 tablet (100 mg total) by mouth daily. 90 tablet 3  . meclizine (ANTIVERT) 25 MG tablet Take 1 tablet (25 mg total) by mouth every 8 (eight) hours. 30 tablet 1  . rosuvastatin (CRESTOR) 20 MG tablet TAKE 1 TABLET BY MOUTH EVERY DAY 90 tablet 2   No current facility-administered medications on file prior to visit.    Allergies  Allergen Reactions  . Tetanus Toxoids Swelling   Social History   Socioeconomic History  . Marital status: Widowed    Spouse name: Not on file  . Number of children: Not on file  . Years of education: Not on file  . Highest education level: Not on file  Occupational History  . Not on file  Social Needs  . Financial resource strain: Not on file  . Food insecurity:    Worry: Not on file    Inability: Not on file  . Transportation needs:    Medical: Not on file    Non-medical: Not  on file  Tobacco Use  . Smoking status: Former Smoker    Last attempt to quit: 12/22/1996    Years since quitting: 21.4  . Smokeless tobacco: Never Used  Substance and Sexual Activity  . Alcohol use: No  . Drug use: No  . Sexual activity: Not on file  Lifestyle  . Physical activity:    Days per week: Not on file    Minutes per session: Not on file  . Stress: Not on file  Relationships  . Social connections:    Talks on phone: Not on file    Gets together: Not on file    Attends religious service: Not on file    Active member of club or organization: Not on file    Attends meetings of clubs or organizations: Not on file    Relationship status: Not on file  . Intimate partner violence:    Fear of current or ex partner: Not on file    Emotionally abused: Not on file     Physically abused: Not on file    Forced sexual activity: Not on file  Other Topics Concern  . Not on file  Social History Narrative  . Not on file      Review of Systems  All other systems reviewed and are negative.      Objective:   Physical Exam  Constitutional: She appears well-developed and well-nourished.  Cardiovascular: Normal rate, regular rhythm and normal heart sounds.  No murmur heard. Pulmonary/Chest: Effort normal and breath sounds normal. No respiratory distress. She has no wheezes. She has no rales.  Abdominal: Soft. Bowel sounds are normal. She exhibits no distension. There is no tenderness. There is no rebound and no guarding.  Vitals reviewed.         Assessment & Plan:  Benign paroxysmal positional vertigo of left ear Suspect BPPV.  Begin meclizine 25 mg every 8 hours as needed for vertigo.  Start Epley maneuvers, and a handout was given to the patient.  Recheck in 1 week if symptoms persist or sooner if worsening or changing.

## 2018-05-31 ENCOUNTER — Other Ambulatory Visit: Payer: Self-pay | Admitting: Family Medicine

## 2018-06-13 DIAGNOSIS — H8110 Benign paroxysmal vertigo, unspecified ear: Secondary | ICD-10-CM | POA: Diagnosis not present

## 2018-06-13 DIAGNOSIS — J0100 Acute maxillary sinusitis, unspecified: Secondary | ICD-10-CM | POA: Diagnosis not present

## 2018-06-29 ENCOUNTER — Other Ambulatory Visit: Payer: Self-pay | Admitting: Family Medicine

## 2018-07-14 ENCOUNTER — Other Ambulatory Visit: Payer: Self-pay

## 2018-07-14 DIAGNOSIS — I6523 Occlusion and stenosis of bilateral carotid arteries: Secondary | ICD-10-CM

## 2018-07-30 ENCOUNTER — Other Ambulatory Visit: Payer: Self-pay | Admitting: Family Medicine

## 2018-07-30 MED ORDER — MECLIZINE HCL 25 MG PO TABS: 25.0000 mg | ORAL_TABLET | Freq: Three times a day (TID) | ORAL | 0 refills | Status: DC | PRN

## 2018-08-10 ENCOUNTER — Ambulatory Visit (HOSPITAL_COMMUNITY)
Admission: RE | Admit: 2018-08-10 | Discharge: 2018-08-10 | Disposition: A | Discharge status: Home / Self Care | Payer: Medicare Other | Source: Ambulatory Visit | Attending: Vascular Surgery | Admitting: Vascular Surgery

## 2018-08-10 ENCOUNTER — Ambulatory Visit: Payer: Medicare Other | Admitting: Family

## 2018-08-10 DIAGNOSIS — I6523 Occlusion and stenosis of bilateral carotid arteries: Secondary | ICD-10-CM | POA: Insufficient documentation

## 2018-08-16 ENCOUNTER — Ambulatory Visit (INDEPENDENT_AMBULATORY_CARE_PROVIDER_SITE_OTHER): Payer: Medicare Other | Admitting: Family

## 2018-08-16 ENCOUNTER — Encounter: Payer: Self-pay | Admitting: Family

## 2018-08-16 VITALS — BP 155/74 | HR 68 | Temp 97.4°F | Resp 18 | Ht 64.0 in | Wt 159.0 lb

## 2018-08-16 DIAGNOSIS — I6523 Occlusion and stenosis of bilateral carotid arteries: Secondary | ICD-10-CM | POA: Diagnosis not present

## 2018-08-16 DIAGNOSIS — Z87891 Personal history of nicotine dependence: Secondary | ICD-10-CM

## 2018-08-16 NOTE — Patient Instructions (Signed)

## 2018-08-16 NOTE — Progress Notes (Signed)
Chief Complaint: Follow up Extracranial Carotid Artery Stenosis   History of Present Illness  Stacey Moody is a 72 y.o. female whom Dr. Donnetta Hutching has been monitoring for known asymptomatic extracranial cerebrovascular occlusive disease. She remains quite active in excellent health with no neurologic deficits.   The patient denies any history of TIA or stroke symptoms Specifically she denies a history of amaurosis fugax or monocular blindness, unilateral facial drooping, hemiplegia, or receptive or expressive aphasia.    She has no cardiac disease.  She states her blood pressure at home usually runs 140's/60's-70's and always increases in a medical office. She stays physically active, denies ETOH use. She denies claudication symptoms with walking.  Patient has not had previous carotid artery intervention.  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   Diabetic: no Tobacco use: former smoker, quit about 1998  Pt meds include: Statin : yes ASA: yes Other anticoagulants/antiplatelets: no    Past Medical History:  Diagnosis Date  . Carotid artery occlusion   . GERD (gastroesophageal reflux disease)   . Hyperlipidemia   . Hypertension   . Influenza A 01/01/2014  . Osteopenia    T-2.3  . Right renal artery stenosis ([place])   . Syncope 12/31/2013   Vasovagal or hypovolemia/orthostatic    Social History Social History   Tobacco Use  . Smoking status: Former Smoker    Last attempt to quit: 12/22/1996    Years since quitting: 21.6  . Smokeless tobacco: Never Used  Substance Use Topics  . Alcohol use: No  . Drug use: No    Family History Family History  Problem Relation Age of Onset  . Cancer Mother        pancratic  . Heart attack Mother   . Cancer Father        stomach  . Diabetes Father   . Hyperlipidemia Father   . Heart disease Father        NOT  before age 72  . Cancer Sister        lung  .  Hyperlipidemia Sister   . Cancer Brother        thyroid, prostate  . Hyperlipidemia Brother   . Heart attack Brother   . Cancer Sister        colon  . Hyperlipidemia Sister   . Cancer Sister        colon  . Hyperlipidemia Sister     Surgical History Past Surgical History:  Procedure Laterality Date  . ABDOMINAL HYSTERECTOMY  1982   Partial hysterectomy  . COLONOSCOPY N/A 07/30/2016   Procedure: COLONOSCOPY;  Surgeon: Daneil Dolin, MD;  [facility];  Service: Endoscopy;  Laterality: N/A;  8:30 AM  . CT SCAN  July 2015   Chest  . POLYPECTOMY  07/30/2016   Procedure: POLYPECTOMY;  Surgeon: Daneil Dolin, MD;  [facility];  Service: Endoscopy;;  descending colon  . SMALL INTESTINE SURGERY      Allergies  Allergen Reactions  . Tetanus Toxoids Swelling    Current Outpatient Medications  Medication Sig Dispense Refill  . amLODipine (NORVASC) 5 MG tablet TAKE 1 TABLET(5 MG) BY MOUTH DAILY 90 tablet 0  . aspirin EC 81 MG tablet Take 81 mg by mouth daily.    . cholecalciferol (VITAMIN D) 1000 UNITS tablet Take 1,000 Units by mouth daily.    . fluticasone (FLONASE) 50  MCG/ACT nasal spray Place 2 sprays into both nostrils daily. (Patient taking differently: Place 2 sprays into both nostrils daily as needed for allergies. ) 16 g 6  . hydrochlorothiazide (HYDRODIURIL) 25 MG tablet TAKE 1 TABLET BY MOUTH EVERY DAY 90 tablet 1  . losartan (COZAAR) 100 MG tablet TAKE 1 TABLET(100 MG) BY MOUTH DAILY 90 tablet 1  . meclizine (ANTIVERT) 25 MG tablet Take 1 tablet (25 mg total) by mouth 3 (three) times daily as needed for dizziness. 30 tablet 0  . rosuvastatin (CRESTOR) 20 MG tablet TAKE 1 TABLET BY MOUTH EVERY DAY 90 tablet 2  . hydrochlorothiazide (HYDRODIURIL) 25 MG tablet TAKE 1 TABLET BY MOUTH EVERY DAY (Patient not taking: Reported on 08/16/2018) 30 tablet 0   No current facility-administered medications for this visit.     Review of Systems : See HPI for  pertinent positives and negatives.  Physical Examination  Vitals:   08/16/18 1025  BP: (!) 155/74  Pulse: 68  Resp: 18  Temp: (!) 97.4 F (36.3 C)  SpO2: 96%  Weight: 159 lb (72.1 kg)  Height: 5\' 4"  (1.626 m)   Body mass index is 27.29 kg/m.  General:WDWN female in NAD GAIT:normal HENT: no gross abnormalities  Eyes: PERRLA Pulmonary: Respirations are non-labored, CTAB, good air movement in all fields. Cardiac: regular rhythm and rate, no detected murmur.  VASCULAR EXAM Carotid Bruits Right Left   positive Negative   Abdominal aortic pulse is not palpable. Radial pulses are 2+ palpable and equal.   LE Pulses Right Left  POPLITEAL not palpable not palpable  POSTERIOR TIBIAL 1+ palpable 2+ palpable  DORSALIS PEDIS ANTERIOR TIBIAL 1+ palpable 2+palpable    Gastrointestinal: soft, nontender, BS WNL, no r/g, no palpable masses. Musculoskeletal: No muscle atrophy/wasting. M/S 5/5 throughout, extremities without ischemic changes. Neurologic: A&O X 3; appropriate affect, speech is normal, CN 2-12 intact, pain and light touch intact in extremities, Motor exam as listed above. Skin: No rashes, no ulcers, no cellulitis.   Psychiatric: Normal thought content, mood appropriate to clinical situation.    Assessment: Stacey Moody is a 72 y.o. female whohas a history of mild/moderate carotid artery stenosis. She has no history of stroke or TIA.  Her atherosclerotic risk factors include former smoker (quit in 1988). Fortunately she does not have DM, does not have any known history of CAD.  She takes a statin and ASA daily.    DATA  Carotid Duplex (08-10-18): Right Carotid: Velocities in the right ICA are consistent with a 1-39% stenosis.        The ECA appears >50% stenosed.  Left Carotid: Velocities in the left ICA are consistent with a 40-59% stenosis.  Vertebrals: Bilateral vertebral arteries  demonstrate antegrade flow. Subclavians: Normal flow hemodynamics were seen in bilateral subclavian       arteries. No change compared to the exams on 07/18/14, 07/24/15, 07/29/16, and 08-04-17.   Plan: Follow-up in 1yearwith Carotid Duplex scan.  I discussed in depth with the patient the nature of atherosclerosis, and emphasized the importance of maximal medical management including strict control of blood pressure, blood glucose, and lipid levels, obtaining regular exercise, and continued cessation of smoking.  The patient is aware that without maximal medical management the underlying atherosclerotic disease process will progress, limiting the benefit of any interventions. The patient was given information about stroke prevention and what symptoms should prompt the patient to seek immediate medical care. Thank you for allowing [place] to participate in this patient's care.  Clemon Chambers, RN, MSN, FNP-C [facility] Office: [facility] Physician: Trula Slade  08/16/18 10:38 AM

## 2018-08-22 ENCOUNTER — Other Ambulatory Visit: Payer: Self-pay | Admitting: Family Medicine

## 2018-10-01 ENCOUNTER — Encounter: Payer: Self-pay | Admitting: Family Medicine

## 2018-10-01 ENCOUNTER — Ambulatory Visit (INDEPENDENT_AMBULATORY_CARE_PROVIDER_SITE_OTHER): Payer: Medicare Other | Admitting: Family Medicine

## 2018-10-01 VITALS — BP 150/68 | HR 78 | Temp 98.0°F | Resp 14 | Ht 64.0 in | Wt 156.0 lb

## 2018-10-01 DIAGNOSIS — Z23 Encounter for immunization: Secondary | ICD-10-CM | POA: Diagnosis not present

## 2018-10-01 DIAGNOSIS — H81399 Other peripheral vertigo, unspecified ear: Secondary | ICD-10-CM | POA: Diagnosis not present

## 2018-10-01 DIAGNOSIS — R42 Dizziness and giddiness: Secondary | ICD-10-CM | POA: Diagnosis not present

## 2018-10-01 DIAGNOSIS — I6523 Occlusion and stenosis of bilateral carotid arteries: Secondary | ICD-10-CM

## 2018-10-01 MED ORDER — MUPIROCIN CALCIUM 2 % EX CREA: 1.0000 "application " | TOPICAL_CREAM | Freq: Two times a day (BID) | CUTANEOUS | 0 refills | Status: DC

## 2018-10-01 NOTE — Progress Notes (Signed)
Subjective:    Patient ID: Stacey Moody, female    DOB: 1946/02/15, 72 y.o.   MRN: [id number]  HPI  05/2018 Patient states that her blood pressures at home have been outstanding.  They are usually in the 130s over 80s.  She states that she believes today's blood pressure is due to the fact she feels poorly.  She checks her blood pressure every day.  She states for the last 11 to 12 days, she has had episodes of vertigo.  The room will start to spin for no reason.  She will feel extremely dizzy and nauseated.  She denies any hearing loss.  She denies any tinnitus.  She denies any headache.  She denies any head trauma.  She denies any fevers or chills.  Examination of the tympanic membranes bilaterally is normal and there is no evidence of a sinus infection.  Dix-Hallpike maneuver is positive to the left side.  AT that time, my plan was: Suspect BPPV.  Begin meclizine 25 mg every 8 hours as needed for vertigo.  Start Epley maneuvers, and a handout was given to the patient.  Recheck in 1 week if symptoms persist or sooner if worsening or changing.  10/01/18 Patient states that her vertigo is persisting.  She states that every 3 months, she will have episodes of vertigo that last 7 to 10 days.  Tums will begin suddenly.  She usually wakes up with the vertigo.  The vertigo will persist throughout the day.  Position changes do not tend to exacerbate the vertigo.  She denies any unilateral hearing loss.  She denies any tinnitus.  She denies any other neurologic deficit although she is starting to have severe right-sided ear pain that feels deep inside according to the patient.  Examination of the auditory canal is completely normal.  There is no evidence of otitis media.  There is no middle ear effusion or otitis externa she is trying meclizine at home but only mitigates the symptoms.  They gradually improve over a week and then return again spontaneously in another 3 months.  She also has several lesions all  over her body that have her concern.  She has a bite on her right fourth digit on the radial aspect of the PIP joint.  It is a 5 mm erythematous macule with a central pore that appears to be a bite mark.  It is tender.  She has a similar lesion on her upper medial right thigh.  She has been spreading mulch around her house recently.  She denies any other rash anywhere else on her body.  She is not sure if she has been bitten by an insect or spider Past Medical History:  Diagnosis Date  . Carotid artery occlusion   . GERD (gastroesophageal reflux disease)   . Hyperlipidemia   . Hypertension   . Influenza A 01/01/2014  . Osteopenia    T-2.3  . Right renal artery stenosis ([place])   . Syncope 12/31/2013   Vasovagal or hypovolemia/orthostatic   Past Surgical History:  Procedure Laterality Date  . ABDOMINAL HYSTERECTOMY  1982   Partial hysterectomy  . COLONOSCOPY N/A 07/30/2016   Procedure: COLONOSCOPY;  Surgeon: Daneil Dolin, MD;  [facility];  Service: Endoscopy;  Laterality: N/A;  8:30 AM  . CT SCAN  July 2015   Chest  . POLYPECTOMY  07/30/2016   Procedure: POLYPECTOMY;  Surgeon: Daneil Dolin, MD;  [facility];  Service: Endoscopy;;  descending colon  . SMALL INTESTINE SURGERY     Current Outpatient Medications on File Prior to Visit  Medication Sig Dispense Refill  . amLODipine (NORVASC) 5 MG tablet TAKE 1 TABLET(5 MG) BY MOUTH DAILY 90 tablet 3  . aspirin EC 81 MG tablet Take 81 mg by mouth daily.    . cholecalciferol (VITAMIN D) 1000 UNITS tablet Take 1,000 Units by mouth daily.    . fluticasone (FLONASE) 50 MCG/ACT nasal spray Place 2 sprays into both nostrils daily. (Patient taking differently: Place 2 sprays into both nostrils daily as needed for allergies. ) 16 g 6  . hydrochlorothiazide (HYDRODIURIL) 25 MG tablet TAKE 1 TABLET BY MOUTH EVERY DAY (Patient not taking: Reported on 08/16/2018) 30 tablet 0  . hydrochlorothiazide (HYDRODIURIL) 25 MG tablet TAKE 1  TABLET BY MOUTH EVERY DAY 90 tablet 1  . losartan (COZAAR) 100 MG tablet TAKE 1 TABLET(100 MG) BY MOUTH DAILY 90 tablet 1  . meclizine (ANTIVERT) 25 MG tablet Take 1 tablet (25 mg total) by mouth 3 (three) times daily as needed for dizziness. 30 tablet 0  . rosuvastatin (CRESTOR) 20 MG tablet TAKE 1 TABLET BY MOUTH EVERY DAY 90 tablet 2   No current facility-administered medications on file prior to visit.    Allergies  Allergen Reactions  . Tetanus Toxoids Swelling   Social History   Socioeconomic History  . Marital status: Widowed    Spouse name: Not on file  . Number of children: Not on file  . Years of education: Not on file  . Highest education level: Not on file  Occupational History  . Not on file  Social Needs  . Financial resource strain: Not on file  . Food insecurity:    Worry: Not on file    Inability: Not on file  . Transportation needs:    Medical: Not on file    Non-medical: Not on file  Tobacco Use  . Smoking status: Former Smoker    Last attempt to quit: 12/22/1996    Years since quitting: 21.7  . Smokeless tobacco: Never Used  Substance and Sexual Activity  . Alcohol use: No  . Drug use: No  . Sexual activity: Not on file  Lifestyle  . Physical activity:    Days per week: Not on file    Minutes per session: Not on file  . Stress: Not on file  Relationships  . Social connections:    Talks on phone: Not on file    Gets together: Not on file    Attends religious service: Not on file    Active member of club or organization: Not on file    Attends meetings of clubs or organizations: Not on file    Relationship status: Not on file  . Intimate partner violence:    Fear of current or ex partner: Not on file    Emotionally abused: Not on file    Physically abused: Not on file    Forced sexual activity: Not on file  Other Topics Concern  . Not on file  Social History Narrative  . Not on file      Review of Systems  Neurological: Positive for  dizziness.  All other systems reviewed and are negative.      Objective:   Physical Exam  Constitutional: She appears well-developed and well-nourished.  HENT:  Head: Normocephalic and atraumatic.  Right Ear: Hearing, tympanic membrane, external ear and ear canal normal.  Left Ear: Hearing, tympanic membrane, external  ear and ear canal normal.  Mouth/Throat: Oropharynx is clear and moist.  Cardiovascular: Normal rate, regular rhythm and normal heart sounds.  No murmur heard. Pulmonary/Chest: Effort normal and breath sounds normal. No respiratory distress. She has no wheezes. She has no rales.  Abdominal: Soft. Bowel sounds are normal. She exhibits no distension. There is no tenderness. There is no rebound and no guarding.  Neurological: She is alert. No cranial nerve deficit. She exhibits normal muscle tone. Coordination normal.  Skin: Lesion noted.     Vitals reviewed.         Assessment & Plan:  Vertigo  Need for immunization against influenza - Plan: Flu Vaccine QUAD 36+ mos IM Obtain MRI to rule out causes of central vertigo given the fact the vertigo is now persistent throughout the day, it is not positional, and it is associated with intense right-sided pain/headache.  I believe the lesion seen on her upper medial right thigh and on her right hand represent insect bites.  I would treat these with Bactroban ointment apply twice a day for 1 week and allow gradual healing on their own.

## 2018-10-04 ENCOUNTER — Other Ambulatory Visit: Payer: Self-pay | Admitting: Family Medicine

## 2018-10-04 DIAGNOSIS — R42 Dizziness and giddiness: Secondary | ICD-10-CM

## 2018-10-04 DIAGNOSIS — H81399 Other peripheral vertigo, unspecified ear: Secondary | ICD-10-CM

## 2018-10-04 NOTE — Progress Notes (Signed)
Patient wanted MRI to be done at [facility]. Provider ordered it for [facility]. New order placed so that it can be scheduled at patient's preferred location.

## 2018-10-07 ENCOUNTER — Ambulatory Visit (HOSPITAL_COMMUNITY)
Admission: RE | Admit: 2018-10-07 | Discharge: 2018-10-07 | Disposition: A | Discharge status: Home / Self Care | Payer: Medicare Other | Source: Ambulatory Visit | Attending: Family Medicine | Admitting: Family Medicine

## 2018-10-07 DIAGNOSIS — H81399 Other peripheral vertigo, unspecified ear: Secondary | ICD-10-CM | POA: Insufficient documentation

## 2018-10-07 DIAGNOSIS — R42 Dizziness and giddiness: Secondary | ICD-10-CM | POA: Diagnosis not present

## 2018-10-07 LAB — POCT I-STAT CREATININE: Creatinine, Ser: 0.6 mg/dL (ref 0.44–1.00)

## 2018-10-07 MED ORDER — GADOBUTROL 1 MMOL/ML IV SOLN
7.0000 mL | Freq: Once | INTRAVENOUS | Status: AC | PRN
  Administered 2018-10-07: 7 mL via INTRAVENOUS

## 2018-10-12 ENCOUNTER — Encounter: Payer: Self-pay | Admitting: *Deleted

## 2018-10-13 ENCOUNTER — Encounter: Payer: Self-pay | Admitting: Family Medicine

## 2018-10-13 DIAGNOSIS — H81399 Other peripheral vertigo, unspecified ear: Secondary | ICD-10-CM

## 2018-10-15 ENCOUNTER — Other Ambulatory Visit: Payer: Self-pay | Admitting: Family Medicine

## 2018-10-29 DIAGNOSIS — H8110 Benign paroxysmal vertigo, unspecified ear: Secondary | ICD-10-CM | POA: Diagnosis not present

## 2018-10-29 DIAGNOSIS — H9042 Sensorineural hearing loss, unilateral, left ear, with unrestricted hearing on the contralateral side: Secondary | ICD-10-CM | POA: Diagnosis not present

## 2018-10-29 DIAGNOSIS — H8112 Benign paroxysmal vertigo, left ear: Secondary | ICD-10-CM | POA: Diagnosis not present

## 2018-12-23 ENCOUNTER — Other Ambulatory Visit: Payer: Self-pay | Admitting: Family Medicine

## 2018-12-31 ENCOUNTER — Other Ambulatory Visit (HOSPITAL_COMMUNITY): Payer: Self-pay | Admitting: Family Medicine

## 2018-12-31 DIAGNOSIS — Z1231 Encounter for screening mammogram for malignant neoplasm of breast: Secondary | ICD-10-CM

## 2019-01-21 ENCOUNTER — Encounter (HOSPITAL_COMMUNITY): Payer: Self-pay

## 2019-01-21 ENCOUNTER — Ambulatory Visit (HOSPITAL_COMMUNITY)
Admission: RE | Admit: 2019-01-21 | Discharge: 2019-01-21 | Disposition: A | Discharge status: Home / Self Care | Payer: Medicare Other | Source: Ambulatory Visit | Attending: Family Medicine | Admitting: Family Medicine

## 2019-01-21 DIAGNOSIS — Z1231 Encounter for screening mammogram for malignant neoplasm of breast: Secondary | ICD-10-CM

## 2019-06-21 ENCOUNTER — Other Ambulatory Visit: Payer: Self-pay | Admitting: Family Medicine

## 2019-07-25 ENCOUNTER — Other Ambulatory Visit: Payer: Self-pay

## 2019-09-19 ENCOUNTER — Other Ambulatory Visit: Payer: Self-pay | Admitting: Family Medicine

## 2019-10-15 DIAGNOSIS — Z23 Encounter for immunization: Secondary | ICD-10-CM | POA: Diagnosis not present

## 2020-01-06 ENCOUNTER — Emergency Department (HOSPITAL_COMMUNITY): Payer: Medicare Other

## 2020-01-06 ENCOUNTER — Encounter (HOSPITAL_COMMUNITY): Payer: Self-pay | Admitting: Emergency Medicine

## 2020-01-06 ENCOUNTER — Other Ambulatory Visit: Payer: Self-pay

## 2020-01-06 ENCOUNTER — Inpatient Hospital Stay (HOSPITAL_COMMUNITY)
Admission: EM | Admit: 2020-01-06 | Discharge: 2020-01-09 | DRG: 287 | Disposition: A | Discharge status: Home / Self Care | Payer: Medicare Other | Attending: Internal Medicine | Admitting: Internal Medicine

## 2020-01-06 DIAGNOSIS — R079 Chest pain, unspecified: Secondary | ICD-10-CM | POA: Diagnosis present

## 2020-01-06 DIAGNOSIS — R200 Anesthesia of skin: Secondary | ICD-10-CM | POA: Diagnosis not present

## 2020-01-06 DIAGNOSIS — Z87891 Personal history of nicotine dependence: Secondary | ICD-10-CM

## 2020-01-06 DIAGNOSIS — Z20822 Contact with and (suspected) exposure to covid-19: Secondary | ICD-10-CM | POA: Diagnosis present

## 2020-01-06 DIAGNOSIS — Z809 Family history of malignant neoplasm, unspecified: Secondary | ICD-10-CM

## 2020-01-06 DIAGNOSIS — E785 Hyperlipidemia, unspecified: Secondary | ICD-10-CM | POA: Diagnosis not present

## 2020-01-06 DIAGNOSIS — Z833 Family history of diabetes mellitus: Secondary | ICD-10-CM

## 2020-01-06 DIAGNOSIS — I6529 Occlusion and stenosis of unspecified carotid artery: Secondary | ICD-10-CM | POA: Diagnosis present

## 2020-01-06 DIAGNOSIS — Z7982 Long term (current) use of aspirin: Secondary | ICD-10-CM

## 2020-01-06 DIAGNOSIS — Z79899 Other long term (current) drug therapy: Secondary | ICD-10-CM

## 2020-01-06 DIAGNOSIS — I739 Peripheral vascular disease, unspecified: Secondary | ICD-10-CM | POA: Diagnosis present

## 2020-01-06 DIAGNOSIS — I25110 Atherosclerotic heart disease of native coronary artery with unstable angina pectoris: Secondary | ICD-10-CM | POA: Diagnosis not present

## 2020-01-06 DIAGNOSIS — E7800 Pure hypercholesterolemia, unspecified: Secondary | ICD-10-CM | POA: Diagnosis present

## 2020-01-06 DIAGNOSIS — R110 Nausea: Secondary | ICD-10-CM | POA: Diagnosis not present

## 2020-01-06 DIAGNOSIS — Z887 Allergy status to serum and vaccine status: Secondary | ICD-10-CM | POA: Diagnosis not present

## 2020-01-06 DIAGNOSIS — I119 Hypertensive heart disease without heart failure: Secondary | ICD-10-CM | POA: Diagnosis present

## 2020-01-06 DIAGNOSIS — R0689 Other abnormalities of breathing: Secondary | ICD-10-CM | POA: Diagnosis not present

## 2020-01-06 DIAGNOSIS — R0602 Shortness of breath: Secondary | ICD-10-CM | POA: Diagnosis not present

## 2020-01-06 DIAGNOSIS — K219 Gastro-esophageal reflux disease without esophagitis: Secondary | ICD-10-CM | POA: Diagnosis not present

## 2020-01-06 DIAGNOSIS — M8580 Other specified disorders of bone density and structure, unspecified site: Secondary | ICD-10-CM | POA: Diagnosis present

## 2020-01-06 DIAGNOSIS — Z8349 Family history of other endocrine, nutritional and metabolic diseases: Secondary | ICD-10-CM

## 2020-01-06 DIAGNOSIS — R0789 Other chest pain: Secondary | ICD-10-CM | POA: Diagnosis not present

## 2020-01-06 DIAGNOSIS — I200 Unstable angina: Secondary | ICD-10-CM

## 2020-01-06 DIAGNOSIS — Z8249 Family history of ischemic heart disease and other diseases of the circulatory system: Secondary | ICD-10-CM

## 2020-01-06 DIAGNOSIS — Z209 Contact with and (suspected) exposure to unspecified communicable disease: Secondary | ICD-10-CM | POA: Diagnosis not present

## 2020-01-06 DIAGNOSIS — R072 Precordial pain: Secondary | ICD-10-CM | POA: Diagnosis not present

## 2020-01-06 DIAGNOSIS — Z90711 Acquired absence of uterus with remaining cervical stump: Secondary | ICD-10-CM

## 2020-01-06 HISTORY — DX: Occlusion and stenosis of unspecified carotid artery: I65.29

## 2020-01-06 LAB — BASIC METABOLIC PANEL
Anion gap: 9 (ref 5–15)
BUN: 14 mg/dL (ref 8–23)
CO2: 23 mmol/L (ref 22–32)
Calcium: 9.3 mg/dL (ref 8.9–10.3)
Chloride: 106 mmol/L (ref 98–111)
Creatinine, Ser: 0.7 mg/dL (ref 0.44–1.00)
GFR calc Af Amer: >60 mL/min (ref >60)
GFR calc non Af Amer: >60 mL/min (ref >60)
Glucose, Bld: 190 mg/dL — ABNORMAL HIGH (ref 70–99)
Potassium: 3.6 mmol/L (ref 3.5–5.1)
Sodium: 138 mmol/L (ref 135–145)

## 2020-01-06 LAB — CBC
HCT: 37.6 % (ref 36.0–46.0)
Hemoglobin: 12.5 g/dL (ref 12.0–15.0)
MCH: 31.6 pg (ref 26.0–34.0)
MCHC: 33.2 g/dL (ref 30.0–36.0)
MCV: 94.9 fL (ref 80.0–100.0)
Platelets: 239 10*3/uL (ref 150–400)
RBC: 3.96 MIL/uL (ref 3.87–5.11)
RDW: 12.1 % (ref 11.5–15.5)
WBC: 8.4 10*3/uL (ref 4.0–10.5)
nRBC: 0 % (ref 0.0–0.2)

## 2020-01-06 LAB — TROPONIN I (HIGH SENSITIVITY)
Troponin I (High Sensitivity): 3 ng/L (ref <18)
Troponin I (High Sensitivity): 3 ng/L (ref <18)

## 2020-01-06 LAB — CBG MONITORING, ED: Glucose-Capillary: 178 mg/dL — ABNORMAL HIGH (ref 70–99)

## 2020-01-06 MED ORDER — NITROGLYCERIN 0.4 MG SL SUBL
0.4000 mg | SUBLINGUAL_TABLET | Freq: Once | SUBLINGUAL | Status: AC
  Administered 2020-01-06: 0.4 mg via SUBLINGUAL
  Filled 2020-01-06: qty 1

## 2020-01-06 NOTE — ED Triage Notes (Signed)
Pt arrives via [facility] w/complaints of central chest pain without radiation. Pt states left arm felt "heavy." pt states she broke out in a sweat & became nauseated around 1600. EMS gave her 4 baby aspirin & Zofran in route. Pt on 2 L of O2 upon arrival, does not wear O2 at home. 100% on RA, nad.

## 2020-01-06 NOTE — ED Notes (Signed)
Pt exposed to COVID on 1/4, pt has not gotten tested & has no symptoms. Taking precautions

## 2020-01-06 NOTE — ED Provider Notes (Signed)
[facility] EMERGENCY DEPARTMENT Provider Note   CSN: [id number] Arrival date & time: 01/06/20  1902     History Chief Complaint  Patient presents with  . Chest Pain    Lynnox TALEIGH SEPE is a 74 y.o. female with a history of peripheral vascular disease disease including carotid artery occlusion, GERD, hypertension, hyperlipidemia and positive exposure to Covid 11 days ago presenting with 3 distinct episodes of midsternal chest pressure since yesterday.  She describes these episodes occurring generally at rest and has been accompanied by nausea and diaphoresis.  Her most recent episode began around 4 PM today and was accompanied by numbness into her left forearm.  The forearm numbness has resolved, she denies nausea or diaphoresis at this time, but does endorse a vague mild midsternal chest pressure.  She denies cough, fevers or chills, wheezing.  Also no abdominal pain.  She denies peripheral edema, palpitations or shortness of breath.  She received aspirin in route by EMS.  HPI  HPI: A 74 year old patient with a history of peripheral artery disease, hypertension and hypercholesterolemia presents for evaluation of chest pain. Initial onset of pain was approximately 3-6 hours ago. The patient's chest pain is described as heaviness/pressure/tightness and is not worse with exertion. The patient complains of nausea. The patient's chest pain is middle- or left-sided, is not well-localized, is not sharp and does radiate to the arms/jaw/neck. The patient denies diaphoresis. The patient has no history of stroke, has not smoked in the past 90 days, denies any history of treated diabetes, has no relevant family history of coronary artery disease (first degree relative at less than age 89) and does not have an elevated BMI (>=30).   Past Medical History:  Diagnosis Date  . Carotid artery occlusion   . GERD (gastroesophageal reflux disease)   . Hyperlipidemia   . Hypertension   . Influenza A 01/01/2014  .  Osteopenia    T-2.3  . Right renal artery stenosis ([place])   . Syncope 12/31/2013   Vasovagal or hypovolemia/orthostatic    Patient Active Problem List   Diagnosis Date Noted  . Chest pain 01/07/2020  . Right renal artery stenosis ([place])   . Osteopenia   . Abdominal pain   . Legionella infection ([place])   . Gastroenteritis 12/31/2014  . Elevated transaminase level 12/31/2014  . CAP (community acquired pneumonia) 12/31/2014  . Nausea vomiting and diarrhea 12/30/2014  . Hypokalemia 12/30/2014  . Hyponatremia 12/30/2014  . Dehydration 12/30/2014  . Fever 12/30/2014  . Other pancytopenia ([place]) 01/03/2014  . Carotid artery stenosis 01/03/2014  . Vertigo 01/02/2014  . Right otitis media 01/02/2014  . Leukopenia 01/02/2014  . Influenza A 01/01/2014  . Scalp hematoma 01/01/2014  . Syncope 12/31/2013  . Diarrhea 12/31/2013  . URI (upper respiratory infection) 12/31/2013  . Hypertension   . Hyperlipidemia   . GERD (gastroesophageal reflux disease)   . Occlusion and stenosis of carotid artery without mention of cerebral infarction 06/22/2012    Past Surgical History:  Procedure Laterality Date  . ABDOMINAL HYSTERECTOMY  1982   Partial hysterectomy  . COLONOSCOPY N/A 07/30/2016   Procedure: COLONOSCOPY;  Surgeon: Daneil Dolin, MD;  Location: [facility];  Service: Endoscopy;  Laterality: N/A;  8:30 AM  . CT SCAN  July 2015   Chest  . POLYPECTOMY  07/30/2016   Procedure: POLYPECTOMY;  Surgeon: Daneil Dolin, MD;  Location: [facility] END[facility] SU[facility];  Service: Endoscopy;;  descending colon  . SMALL INTESTINE SURGERY  OB History   No obstetric history on file.     Family History  Problem Relation Age of Onset  . Cancer Mother        pancratic  . Heart attack Mother   . Cancer Father        stomach  . Diabetes Father   . Hyperlipidemia Father   . Heart disease Father        NOT  before age 49  . Cancer Sister        lung  . Hyperlipidemia Sister   . Cancer Brother         thyroid, prostate  . Hyperlipidemia Brother   . Heart attack Brother   . Cancer Sister        colon  . Hyperlipidemia Sister   . Cancer Sister        colon  . Hyperlipidemia Sister     Social History   Tobacco Use  . Smoking status: Former Smoker    Quit date: 12/22/1996    Years since quitting: 23.0  . Smokeless tobacco: Never Used  Substance Use Topics  . Alcohol use: No  . Drug use: No    Home Medications Prior to Admission medications   Medication Sig Start Date End Date Taking? Authorizing Provider  amLODipine (NORVASC) 5 MG tablet TAKE 1 TABLET(5 MG) BY MOUTH DAILY 09/19/19   Susy Frizzle, MD  aspirin EC 81 MG tablet Take 81 mg by mouth daily.    [provider]  cholecalciferol (VITAMIN D) 1000 UNITS tablet Take 1,000 Units by mouth daily.    [provider]  fluticasone (FLONASE) 50 MCG/ACT nasal spray Place 2 sprays into both nostrils daily. Patient taking differently: Place 2 sprays into both nostrils daily as needed for allergies.  11/25/17   Dena Billet B, PA-C  hydrochlorothiazide (HYDRODIURIL) 25 MG tablet TAKE 1 TABLET BY MOUTH EVERY DAY 09/19/19   Susy Frizzle, MD  losartan (COZAAR) 100 MG tablet TAKE 1 TABLET(100 MG) BY MOUTH DAILY 09/19/19   Susy Frizzle, MD  meclizine (ANTIVERT) 25 MG tablet TAKE 1 TABLET(25 MG) BY MOUTH THREE TIMES DAILY AS NEEDED FOR DIZZINESS 10/15/18   Susy Frizzle, MD  mupirocin cream (BACTROBAN) 2 % Apply 1 application topically 2 (two) times daily. 10/01/18   Susy Frizzle, MD  rosuvastatin (CRESTOR) 20 MG tablet TAKE 1 TABLET BY MOUTH EVERY DAY 09/19/19   Susy Frizzle, MD    Allergies    Tetanus toxoids  Review of Systems   Review of Systems  Constitutional: Positive for diaphoresis. Negative for fever.  HENT: Negative for congestion and sore throat.   Eyes: Negative.   Respiratory: Negative for chest tightness and shortness of breath.   Cardiovascular: Positive for chest pain. Negative  for palpitations and leg swelling.  Gastrointestinal: Positive for nausea. Negative for abdominal pain and vomiting.  Genitourinary: Negative.   Musculoskeletal: Negative for arthralgias, joint swelling and neck pain.  Skin: Negative.  Negative for rash and wound.  Neurological: Positive for numbness. Negative for dizziness, weakness, light-headedness and headaches.  Psychiatric/Behavioral: Negative.     Physical Exam Updated Vital Signs BP (!) 135/53   Pulse 65   Temp 97.9 F (36.6 C) (Oral)   Resp 11   Ht 5\' 3"  (1.6 m)   Wt 69.4 kg   SpO2 94%   BMI 27.10 kg/m   Physical Exam Vitals and nursing note reviewed.  Constitutional:      Appearance:  She is well-developed.  HENT:     Head: Normocephalic and atraumatic.  Eyes:     Conjunctiva/sclera: Conjunctivae normal.  Cardiovascular:     Rate and Rhythm: Normal rate and regular rhythm.     Heart sounds: Normal heart sounds.  Pulmonary:     Effort: Pulmonary effort is normal.     Breath sounds: Normal breath sounds. No wheezing.  Abdominal:     General: Bowel sounds are normal.     Palpations: Abdomen is soft.     Tenderness: There is no abdominal tenderness.  Musculoskeletal:        General: Normal range of motion.     Cervical back: Normal range of motion.     Right lower leg: No edema.     Left lower leg: No edema.  Skin:    General: Skin is warm and dry.  Neurological:     Mental Status: She is alert.     ED Results / Procedures / Treatments   Labs (all labs ordered are listed, but only abnormal results are displayed) Labs Reviewed  BASIC METABOLIC PANEL - Abnormal; Notable for the following components:      Result Value   Glucose, Bld 190 (*)    All other components within normal limits  CBG MONITORING, ED - Abnormal; Notable for the following components:   Glucose-Capillary 178 (*)    All other components within normal limits  SARS CORONAVIRUS 2 (TAT 6-24 HRS)  CBC  COMPREHENSIVE METABOLIC PANEL  CBC   HEMOGLOBIN A1C  LIPID PANEL  TROPONIN I (HIGH SENSITIVITY)  TROPONIN I (HIGH SENSITIVITY)    EKG EKG Interpretation  Date/Time:  Friday January 06 2020 19:04:20 EST Ventricular Rate:  81 PR Interval:    QRS Duration: 86 QT Interval:  389 QTC Calculation: 452 R Axis:   50 Text Interpretation: Sinus rhythm Abnormal R-wave progression, early transition Confirmed by Milton Ferguson [phone]) on 01/06/2020 8:00:26 PM Also confirmed by Milton Ferguson [phone])  on 01/06/2020 11:26:42 PM   Radiology DG Chest Portable 1 View  Result Date: 01/06/2020 CLINICAL DATA:  Chest pain EXAM: PORTABLE CHEST 1 VIEW COMPARISON:  12/31/2013 FINDINGS: Cardiac shadows within normal limits. The lungs are well aerated bilaterally. No focal infiltrate is seen. Mild aortic calcifications are noted. Bony structures are unremarkable. IMPRESSION: No active disease. Electronically Signed   By: Inez Catalina M.D.   On: 01/06/2020 20:25    Procedures Procedures (including critical care time)  Medications Ordered in ED Medications  enoxaparin (LOVENOX) injection 40 mg (has no administration in time range)  0.9 %  sodium chloride infusion (has no administration in time range)  acetaminophen (TYLENOL) tablet 650 mg (has no administration in time range)    Or  acetaminophen (TYLENOL) suppository 650 mg (has no administration in time range)  nitroGLYCERIN (NITROSTAT) SL tablet 0.4 mg (0.4 mg Sublingual Given 01/06/20 2356)    ED Course  I have reviewed the triage vital signs and the nursing notes.  Pertinent labs & imaging results that were available during my care of the patient were reviewed by me and considered in my medical decision making (see chart for details).    MDM Rules/Calculators/A&P HEAR Score: 6                    Patient with a history of hypertension, hypercholesterolemia, peripheral vascular disease presenting with symptoms suggesting of possible unstable angina.  Her delta troponins are negative.   She has a heart score of  6.  She was given aspirin prior to arrival.  She was also given 1 sublingual nitroglycerin tablet here which resolved the mild residual chest pressure that was still present during her visit.  She would benefit from admission for further cardiac work-up.  Discussed with Dr. Maudie Mercury who agrees with admission. Final Clinical Impression(s) / ED Diagnoses Final diagnoses:  Chest pain, unspecified type    Rx / DC Orders ED Discharge Orders    None       Landis Martins 01/07/20 0124    Milton Ferguson, MD 01/08/20 [phone]

## 2020-01-07 ENCOUNTER — Observation Stay (HOSPITAL_BASED_OUTPATIENT_CLINIC_OR_DEPARTMENT_OTHER): Payer: Medicare Other

## 2020-01-07 DIAGNOSIS — E7800 Pure hypercholesterolemia, unspecified: Secondary | ICD-10-CM | POA: Diagnosis not present

## 2020-01-07 DIAGNOSIS — R079 Chest pain, unspecified: Secondary | ICD-10-CM | POA: Diagnosis present

## 2020-01-07 DIAGNOSIS — I119 Hypertensive heart disease without heart failure: Secondary | ICD-10-CM | POA: Diagnosis not present

## 2020-01-07 DIAGNOSIS — M8580 Other specified disorders of bone density and structure, unspecified site: Secondary | ICD-10-CM | POA: Diagnosis not present

## 2020-01-07 DIAGNOSIS — Z79899 Other long term (current) drug therapy: Secondary | ICD-10-CM | POA: Diagnosis not present

## 2020-01-07 DIAGNOSIS — I25110 Atherosclerotic heart disease of native coronary artery with unstable angina pectoris: Secondary | ICD-10-CM | POA: Diagnosis not present

## 2020-01-07 DIAGNOSIS — Z8349 Family history of other endocrine, nutritional and metabolic diseases: Secondary | ICD-10-CM | POA: Diagnosis not present

## 2020-01-07 DIAGNOSIS — Z87891 Personal history of nicotine dependence: Secondary | ICD-10-CM | POA: Diagnosis not present

## 2020-01-07 DIAGNOSIS — Z20822 Contact with and (suspected) exposure to covid-19: Secondary | ICD-10-CM | POA: Diagnosis not present

## 2020-01-07 DIAGNOSIS — Z887 Allergy status to serum and vaccine status: Secondary | ICD-10-CM | POA: Diagnosis not present

## 2020-01-07 DIAGNOSIS — K219 Gastro-esophageal reflux disease without esophagitis: Secondary | ICD-10-CM | POA: Diagnosis not present

## 2020-01-07 DIAGNOSIS — Z8249 Family history of ischemic heart disease and other diseases of the circulatory system: Secondary | ICD-10-CM | POA: Diagnosis not present

## 2020-01-07 DIAGNOSIS — I739 Peripheral vascular disease, unspecified: Secondary | ICD-10-CM | POA: Diagnosis not present

## 2020-01-07 DIAGNOSIS — Z90711 Acquired absence of uterus with remaining cervical stump: Secondary | ICD-10-CM | POA: Diagnosis not present

## 2020-01-07 DIAGNOSIS — I6529 Occlusion and stenosis of unspecified carotid artery: Secondary | ICD-10-CM | POA: Diagnosis not present

## 2020-01-07 DIAGNOSIS — Z809 Family history of malignant neoplasm, unspecified: Secondary | ICD-10-CM | POA: Diagnosis not present

## 2020-01-07 DIAGNOSIS — Z7982 Long term (current) use of aspirin: Secondary | ICD-10-CM | POA: Diagnosis not present

## 2020-01-07 DIAGNOSIS — E785 Hyperlipidemia, unspecified: Secondary | ICD-10-CM | POA: Diagnosis not present

## 2020-01-07 DIAGNOSIS — Z833 Family history of diabetes mellitus: Secondary | ICD-10-CM | POA: Diagnosis not present

## 2020-01-07 LAB — COMPREHENSIVE METABOLIC PANEL
ALT: 19 U/L (ref 0–44)
AST: 21 U/L (ref 15–41)
Albumin: 4.2 g/dL (ref 3.5–5.0)
Alkaline Phosphatase: 59 U/L (ref 38–126)
Anion gap: 10 (ref 5–15)
BUN: 12 mg/dL (ref 8–23)
CO2: 25 mmol/L (ref 22–32)
Calcium: 9.5 mg/dL (ref 8.9–10.3)
Chloride: 104 mmol/L (ref 98–111)
Creatinine, Ser: 0.65 mg/dL (ref 0.44–1.00)
GFR calc Af Amer: >60 mL/min (ref >60)
GFR calc non Af Amer: >60 mL/min (ref >60)
Glucose, Bld: 122 mg/dL — ABNORMAL HIGH (ref 70–99)
Potassium: 3.5 mmol/L (ref 3.5–5.1)
Sodium: 139 mmol/L (ref 135–145)
Total Bilirubin: 0.7 mg/dL (ref 0.3–1.2)
Total Protein: 7 g/dL (ref 6.5–8.1)

## 2020-01-07 LAB — SARS CORONAVIRUS 2 (TAT 6-24 HRS): SARS Coronavirus 2: NEGATIVE

## 2020-01-07 LAB — LIPID PANEL
Cholesterol: 131 mg/dL (ref 0–200)
HDL: 64 mg/dL (ref >40)
LDL Cholesterol: 54 mg/dL (ref 0–99)
Total CHOL/HDL Ratio: 2 RATIO
Triglycerides: 63 mg/dL (ref <150)
VLDL: 13 mg/dL (ref 0–40)

## 2020-01-07 LAB — CBC
HCT: 37.5 % (ref 36.0–46.0)
Hemoglobin: 12.4 g/dL (ref 12.0–15.0)
MCH: 31.2 pg (ref 26.0–34.0)
MCHC: 33.1 g/dL (ref 30.0–36.0)
MCV: 94.5 fL (ref 80.0–100.0)
Platelets: 236 10*3/uL (ref 150–400)
RBC: 3.97 MIL/uL (ref 3.87–5.11)
RDW: 12.1 % (ref 11.5–15.5)
WBC: 7.5 10*3/uL (ref 4.0–10.5)
nRBC: 0 % (ref 0.0–0.2)

## 2020-01-07 LAB — ECHOCARDIOGRAM COMPLETE
Height: 63 in
Weight: 2448 oz

## 2020-01-07 LAB — HEMOGLOBIN A1C
Hgb A1c MFr Bld: 6.3 % — ABNORMAL HIGH (ref 4.8–5.6)
Mean Plasma Glucose: 134.11 mg/dL

## 2020-01-07 MED ORDER — NITROGLYCERIN 0.4 MG SL SUBL
0.4000 mg | SUBLINGUAL_TABLET | SUBLINGUAL | Status: DC | PRN
  Administered 2020-01-07: 0.4 mg via SUBLINGUAL
  Filled 2020-01-07: qty 1

## 2020-01-07 MED ORDER — ASPIRIN EC 81 MG PO TBEC
81.0000 mg | DELAYED_RELEASE_TABLET | Freq: Every day | ORAL | Status: DC
  Administered 2020-01-07 – 2020-01-08 (×2): 81 mg via ORAL
  Filled 2020-01-07 (×3): qty 1

## 2020-01-07 MED ORDER — SODIUM CHLORIDE 0.9 % IV SOLN: INTRAVENOUS | Status: AC

## 2020-01-07 MED ORDER — ACETAMINOPHEN 650 MG RE SUPP: 650.0000 mg | Freq: Four times a day (QID) | RECTAL | Status: DC | PRN

## 2020-01-07 MED ORDER — AMLODIPINE BESYLATE 5 MG PO TABS
5.0000 mg | ORAL_TABLET | Freq: Every day | ORAL | Status: DC
  Administered 2020-01-07 – 2020-01-08 (×2): 5 mg via ORAL
  Filled 2020-01-07 (×2): qty 1

## 2020-01-07 MED ORDER — ENOXAPARIN SODIUM 40 MG/0.4ML ~~LOC~~ SOLN
40.0000 mg | SUBCUTANEOUS | Status: DC
  Administered 2020-01-07 – 2020-01-09 (×3): 40 mg via SUBCUTANEOUS
  Filled 2020-01-07 (×3): qty 0.4

## 2020-01-07 MED ORDER — POTASSIUM CHLORIDE CRYS ER 20 MEQ PO TBCR
40.0000 meq | EXTENDED_RELEASE_TABLET | Freq: Once | ORAL | Status: AC
  Administered 2020-01-07: 40 meq via ORAL
  Filled 2020-01-07: qty 2

## 2020-01-07 MED ORDER — FLUTICASONE PROPIONATE 50 MCG/ACT NA SUSP
2.0000 | Freq: Every day | NASAL | Status: DC | PRN
  Filled 2020-01-07: qty 16

## 2020-01-07 MED ORDER — LOSARTAN POTASSIUM 50 MG PO TABS
100.0000 mg | ORAL_TABLET | Freq: Every day | ORAL | Status: DC
  Administered 2020-01-07 – 2020-01-09 (×3): 100 mg via ORAL
  Filled 2020-01-07 (×2): qty 2
  Filled 2020-01-07: qty 4

## 2020-01-07 MED ORDER — VITAMIN D 25 MCG (1000 UNIT) PO TABS
1000.0000 [IU] | ORAL_TABLET | Freq: Every day | ORAL | Status: DC
  Administered 2020-01-07 – 2020-01-08 (×2): 1000 [IU] via ORAL
  Filled 2020-01-07 (×4): qty 1

## 2020-01-07 MED ORDER — ACETAMINOPHEN 325 MG PO TABS
650.0000 mg | ORAL_TABLET | Freq: Four times a day (QID) | ORAL | Status: DC | PRN
  Administered 2020-01-07: 650 mg via ORAL
  Filled 2020-01-07: qty 2

## 2020-01-07 MED ORDER — HYDROCHLOROTHIAZIDE 25 MG PO TABS
25.0000 mg | ORAL_TABLET | Freq: Every day | ORAL | Status: DC
  Administered 2020-01-07 – 2020-01-08 (×2): 25 mg via ORAL
  Filled 2020-01-07 (×2): qty 1

## 2020-01-07 MED ORDER — ROSUVASTATIN CALCIUM 20 MG PO TABS
20.0000 mg | ORAL_TABLET | Freq: Every day | ORAL | Status: DC
  Administered 2020-01-07 – 2020-01-08 (×2): 20 mg via ORAL
  Filled 2020-01-07 (×5): qty 1

## 2020-01-07 NOTE — Progress Notes (Signed)
2D Echocardiogram has been performed.  Stacey Moody 01/07/2020, 10:10 AM

## 2020-01-07 NOTE — H&P (Signed)
TR[facility] H&P    Patient Demographics:    Stacey Moody, is a 74 y.o. female  MRN: [id number]  DOB - 11/06/1946  Admit Date - 01/06/2020  Referring MD/NP/PA:  Evalee Jefferson  Outpatient Primary MD for the patient is Susy Frizzle, MD  Patient coming from:  home  Chief complaint-  Chest pain    HPI:    Stacey Moody  is a 73 y.o. female,  w gerd, hypertension, hyperlipidemia, h/o R RAS, presents with chest pain x3 episodes over the past 2 days.  Pt most recently experience chest pressure substernal with left arm feeling heavy. Starting about 4pm (01/06/20) associated w nausea, and diaphoresis. Pt was given aspirin en route to ER, and slg nitro with relief in Ed.  Pt denies fever, chills, palp, sob, abd pain, diarrhea, brbpr.  Pt has not had any recent stress testing.     In ED,  T 97.9, P 89 R 10, Bp 174/57  Pox 100% on on 3L     Wt 69.4kg  CXR IMPRESSION: No active disease.  Na 138, K 3.6 Bun 14, Creatinine 0.70 Wbc 8.4, Hgb 12.5, Plt 239  Trop 3 -> 3  Ekg nsr at 80, nl axis, nl int, early R progression, no st-t changes c/w ischemia  Pt will be admitted for evaluation of chest pain     Review of systems:    In addition to the HPI above,  No Fever-chills, No Headache, No changes with Vision or hearing, No problems swallowing food or Liquids, No  Cough or Shortness of Breath, No Abdominal pain, No Nausea or Vomiting, bowel movements are regular, No Blood in stool or Urine, No dysuria, No new skin rashes or bruises, No new joints pains-aches,  No new weakness, tingling, numbness in any extremity, No recent weight gain or loss, No polyuria, polydypsia or polyphagia, No significant Mental Stressors.  All other systems reviewed and are negative.    Past History of the following :    Past Medical History:  Diagnosis Date  . Carotid artery occlusion   . GERD (gastroesophageal reflux disease)    . Hyperlipidemia   . Hypertension   . Influenza A 01/01/2014  . Osteopenia    T-2.3  . Right renal artery stenosis ([place])   . Syncope 12/31/2013   Vasovagal or hypovolemia/orthostatic      Past Surgical History:  Procedure Laterality Date  . ABDOMINAL HYSTERECTOMY  1982   Partial hysterectomy  . COLONOSCOPY N/A 07/30/2016   Procedure: COLONOSCOPY;  Surgeon: Daneil Dolin, MD;  Location[facility];  Service: Endoscopy;  Laterality: N/A;  8:30 AM  . CT SCAN  July 2015   Chest  . POLYPECTOMY  07/30/2016   Procedure: POLYPECTOMY;  Surgeon: Daneil Dolin, MD;  Location: [facility];  Service: Endoscopy;;  descending colon  . SMALL INTESTINE SURGERY        Social History:      Social History   Tobacco Use  . Smoking status: Former Smoker    Quit date:  12/22/1996    Years since quitting: 23.0  . Smokeless tobacco: Never Used  Substance Use Topics  . Alcohol use: No       Family History :     Family History  Problem Relation Age of Onset  . Cancer Mother        pancratic  . Heart attack Mother   . Cancer Father        stomach  . Diabetes Father   . Hyperlipidemia Father   . Heart disease Father        NOT  before age 62  . Cancer Sister        lung  . Hyperlipidemia Sister   . Cancer Brother        thyroid, prostate  . Hyperlipidemia Brother   . Heart attack Brother   . Cancer Sister        colon  . Hyperlipidemia Sister   . Cancer Sister        colon  . Hyperlipidemia Sister        Home Medications:   Prior to Admission medications   Medication Sig Start Date End Date Taking? Authorizing Provider  amLODipine (NORVASC) 5 MG tablet TAKE 1 TABLET(5 MG) BY MOUTH DAILY 09/19/19   Susy Frizzle, MD  aspirin EC 81 MG tablet Take 81 mg by mouth daily.    [provider]  cholecalciferol (VITAMIN D) 1000 UNITS tablet Take 1,000 Units by mouth daily.    [provider]  fluticasone (FLONASE) 50 MCG/ACT nasal spray Place 2 sprays into  both nostrils daily. Patient taking differently: Place 2 sprays into both nostrils daily as needed for allergies.  11/25/17   Dena Billet B, PA-C  hydrochlorothiazide (HYDRODIURIL) 25 MG tablet TAKE 1 TABLET BY MOUTH EVERY DAY 09/19/19   Susy Frizzle, MD  losartan (COZAAR) 100 MG tablet TAKE 1 TABLET(100 MG) BY MOUTH DAILY 09/19/19   Susy Frizzle, MD  meclizine (ANTIVERT) 25 MG tablet TAKE 1 TABLET(25 MG) BY MOUTH THREE TIMES DAILY AS NEEDED FOR DIZZINESS 10/15/18   Susy Frizzle, MD  mupirocin cream (BACTROBAN) 2 % Apply 1 application topically 2 (two) times daily. 10/01/18   Susy Frizzle, MD  rosuvastatin (CRESTOR) 20 MG tablet TAKE 1 TABLET BY MOUTH EVERY DAY 09/19/19   Susy Frizzle, MD     Allergies:     Allergies  Allergen Reactions  . Tetanus Toxoids Swelling     Physical Exam:   Vitals  Blood pressure (!) 131/56, pulse 62, temperature 97.9 F (36.6 C), temperature source Oral, resp. rate 13, height 5\' 3"  (1.6 m), weight 69.4 kg, SpO2 91 %.  1.  General: axoxo3  2. Psychiatric: euthymic  3. Neurologic: nonfocal  4. HEENMT:  Anicteric, pupils 1.19mm symmetric, direct, consensual intact Neck: no jvd  5. Respiratory : CTAB  6. Cardiovascular : rrr s1, s2, no m/g/r  7. Gastrointestinal:  Abd: soft, nt, nd, +bs  8. Skin:  Ext: no c/c/e, no rash  9.Musculoskeletal:  Good ROM    Data Review:    CBC Recent Labs  Lab 01/06/20 2023  WBC 8.4  HGB 12.5  HCT 37.6  PLT 239  MCV 94.9  MCH 31.6  MCHC 33.2  RDW 12.1   ------------------------------------------------------------------------------------------------------------------  Results for orders placed or performed during the hospital encounter of 01/06/20 (from the past 48 hour(s))  Basic metabolic panel     Status: Abnormal   Collection Time: 01/06/20  8:23 PM  Result Value Ref Range   Sodium 138 135 - 145 mmol/L   Potassium 3.6 3.5 - 5.1 mmol/L   Chloride 106 98 - 111 mmol/L     CO2 23 22 - 32 mmol/L   Glucose, Bld 190 (H) 70 - 99 mg/dL   BUN 14 8 - 23 mg/dL   Creatinine, Ser 0.70 0.44 - 1.00 mg/dL   Calcium 9.3 8.9 - 10.3 mg/dL   GFR calc non Af Amer >60 >60 mL/min   GFR calc Af Amer >60 >60 mL/min   Anion gap 9 5 - 15    Comment: Performed at [facility], [address] [place], [place] [address]  Troponin I (High Sensitivity)     Status: None   Collection Time: 01/06/20  8:23 PM  Result Value Ref Range   Troponin I (High Sensitivity) 3 <18 ng/L    Comment: (NOTE) Elevated high sensitivity troponin I (hsTnI) values and significant  changes across serial measurements may suggest ACS but many other  chronic and acute conditions are known to elevate hsTnI results.  Refer to the "Links" section for chest pain algorithms and additional  guidance. Performed at [facility], [address], [place], [place] [address]   CBC     Status: None   Collection Time: 01/06/20  8:23 PM  Result Value Ref Range   WBC 8.4 4.0 - 10.5 K/uL   RBC 3.96 3.87 - 5.11 MIL/uL   Hemoglobin 12.5 12.0 - 15.0 g/dL   HCT 37.6 36.0 - 46.0 %   MCV 94.9 80.0 - 100.0 fL   MCH 31.6 26.0 - 34.0 pg   MCHC 33.2 30.0 - 36.0 g/dL   RDW 12.1 11.5 - 15.5 %   Platelets 239 150 - 400 K/uL   nRBC 0.0 0.0 - 0.2 %    Comment: Performed at [facility], [address], [place], [place] [address]  CBG monitoring, ED     Status: Abnormal   Collection Time: 01/06/20  8:30 PM  Result Value Ref Range   Glucose-Capillary 178 (H) 70 - 99 mg/dL  Troponin I (High Sensitivity)     Status: None   Collection Time: 01/06/20 10:02 PM  Result Value Ref Range   Troponin I (High Sensitivity) 3 <18 ng/L    Comment: (NOTE) Elevated high sensitivity troponin I (hsTnI) values and significant  changes across serial measurements may suggest ACS but many other  chronic and acute conditions are known to elevate hsTnI results.  Refer to the "Links" section for chest pain algorithms and additional   guidance. Performed at [facility], [address], [place], [place] [address]     Chemistries  Recent Labs  Lab 01/06/20 2023  NA 138  K 3.6  CL 106  CO2 23  GLUCOSE 190*  BUN 14  CREATININE 0.70  CALCIUM 9.3   ------------------------------------------------------------------------------------------------------------------  ------------------------------------------------------------------------------------------------------------------ GFR: Estimated Creatinine Clearance: 57.7 mL/min (by C-G formula based on SCr of 0.7 mg/dL). Liver Function Tests: No results for input(s): AST, ALT, ALKPHOS, BILITOT, PROT, ALBUMIN in the last 168 hours. No results for input(s): LIPASE, AMYLASE in the last 168 hours. No results for input(s): AMMONIA in the last 168 hours. Coagulation Profile: No results for input(s): INR, PROTIME in the last 168 hours. Cardiac Enzymes: No results for input(s): CKTOTAL, CKMB, CKMBINDEX, TROPONINI in the last 168 hours. BNP (last 3 results) No results for input(s): PROBNP in the last 8760 hours. HbA1C: No results for input(s): HGBA1C in the last 72 hours.  CBG: Recent Labs  Lab 01/06/20 2030  GLUCAP 178*   Lipid Profile: No results for input(s): CHOL, HDL, LDLCALC, TRIG, CHOLHDL, LDLDIRECT in the last 72 hours. Thyroid Function Tests: No results for input(s): TSH, T4TOTAL, FREET4, T3FREE, THYROIDAB in the last 72 hours. Anemia Panel: No results for input(s): VITAMINB12, FOLATE, FERRITIN, TIBC, IRON, RETICCTPCT in the last 72 hours.  --------------------------------------------------------------------------------------------------------------- Urine analysis:    Component Value Date/Time   COLORURINE STRAW (A) 12/01/2017 2200   APPEARANCEUR CLEAR 12/01/2017 2200   LABSPEC 1.004 (L) 12/01/2017 2200   PHURINE 6.0 12/01/2017 2200   GLUCOSEU NEGATIVE 12/01/2017 2200   HGBUR NEGATIVE 12/01/2017 2200   BILIRUBINUR NEGATIVE 12/01/2017 2200    KETONESUR NEGATIVE 12/01/2017 2200   PROTEINUR NEGATIVE 12/01/2017 2200   UROBILINOGEN 1.0 12/29/2014 1217   NITRITE NEGATIVE 12/01/2017 2200   LEUKOCYTESUR TRACE (A) 12/01/2017 2200      Imaging Results:    DG Chest Portable 1 View  Result Date: 01/06/2020 CLINICAL DATA:  Chest pain EXAM: PORTABLE CHEST 1 VIEW COMPARISON:  12/31/2013 FINDINGS: Cardiac shadows within normal limits. The lungs are well aerated bilaterally. No focal infiltrate is seen. Mild aortic calcifications are noted. Bony structures are unremarkable. IMPRESSION: No active disease. Electronically Signed   By: Inez Catalina M.D.   On: 01/06/2020 20:25       Assessment & Plan:    Active Problems:   Chest pain  Chest pain  Tele  Trop I  Check hga1c, lipid, Check cardiac echo NPO Aspirin Cont Crestor 20mg  po qhs Cont Losartan 100mg  po qday Cont Amlodipine 5mg  po qday Cont Hydrochlorothiazide 25mg  po qday Please consult cardiology in am     DVT Prophylaxis-   Lovenox - SCDs  AM Labs Ordered, also please review Full Orders  Family Communication: Admission, patients condition and plan of care including tests being ordered have been discussed with the patient who indicate understanding and agree with the plan and Code Status.  Code Status:  FULL CODE per patient  Admission status: Observation: Based on patients clinical presentation and evaluation of above clinical data, I have made determination that patient meets Observation  criteria at this time.  Time spent in minutes : 55 minutes   Jani Gravel M.D on 01/07/2020 at 2:11 AM

## 2020-01-07 NOTE — Progress Notes (Signed)
-  Patient was admitted earlier today.  Kindly see H&P done by Dr. Jani Gravel. -In summary, patient was admitted with typical chest pain that started yesterday. -Patient endorsed left-sided pressure-like chest pain, with associated nausea, shortness of breath and diaphoresis.  Left arm heaviness is also reported. -Troponin has been negative on 2 occasions. -EKG is not typical for acute coronary syndrome. -Patient is awaiting transfer to [facility] for further work-up of the chest pain. -No chest pain no other symptoms reported at the moment.  On examination: General: Patient is awake and alert.  Patient is not in any obvious distress. HEENT: No jaundice.  No pallor. Neck: No JVD. Lungs: Clear to auscultation. CVS: S1-S2. Abdomen: Obese, soft and nontender.  Organs are difficult to assess. Neuro: Patient is awake and alert.  Patient moves all extremities. Extremities: No leg edema.  Assessment and plan: -Complete work-up as documented by Dr. Maudie Mercury. -Transfer patient to [facility] as planned. -There is documentation of renal artery stenosis history.  If patient undergoes catheterization by any chance, and if deemed necessary, consideration of renal artery work-up may be considered.  Further management will depend on hospital course.

## 2020-01-07 NOTE — ED Notes (Signed)
Pain is relieved after first NTG sl.

## 2020-01-08 ENCOUNTER — Encounter (HOSPITAL_COMMUNITY): Payer: Self-pay | Admitting: Internal Medicine

## 2020-01-08 DIAGNOSIS — Z7982 Long term (current) use of aspirin: Secondary | ICD-10-CM | POA: Diagnosis not present

## 2020-01-08 DIAGNOSIS — K219 Gastro-esophageal reflux disease without esophagitis: Secondary | ICD-10-CM | POA: Diagnosis present

## 2020-01-08 DIAGNOSIS — I739 Peripheral vascular disease, unspecified: Secondary | ICD-10-CM | POA: Diagnosis present

## 2020-01-08 DIAGNOSIS — Z8249 Family history of ischemic heart disease and other diseases of the circulatory system: Secondary | ICD-10-CM | POA: Diagnosis not present

## 2020-01-08 DIAGNOSIS — I2510 Atherosclerotic heart disease of native coronary artery without angina pectoris: Secondary | ICD-10-CM | POA: Diagnosis not present

## 2020-01-08 DIAGNOSIS — I6529 Occlusion and stenosis of unspecified carotid artery: Secondary | ICD-10-CM | POA: Diagnosis present

## 2020-01-08 DIAGNOSIS — R079 Chest pain, unspecified: Secondary | ICD-10-CM | POA: Diagnosis not present

## 2020-01-08 DIAGNOSIS — I200 Unstable angina: Secondary | ICD-10-CM | POA: Diagnosis not present

## 2020-01-08 DIAGNOSIS — I25110 Atherosclerotic heart disease of native coronary artery with unstable angina pectoris: Secondary | ICD-10-CM | POA: Diagnosis not present

## 2020-01-08 DIAGNOSIS — E785 Hyperlipidemia, unspecified: Secondary | ICD-10-CM

## 2020-01-08 DIAGNOSIS — Z90711 Acquired absence of uterus with remaining cervical stump: Secondary | ICD-10-CM | POA: Diagnosis not present

## 2020-01-08 DIAGNOSIS — Z20822 Contact with and (suspected) exposure to covid-19: Secondary | ICD-10-CM | POA: Diagnosis present

## 2020-01-08 DIAGNOSIS — E7800 Pure hypercholesterolemia, unspecified: Secondary | ICD-10-CM | POA: Diagnosis present

## 2020-01-08 DIAGNOSIS — I119 Hypertensive heart disease without heart failure: Secondary | ICD-10-CM | POA: Diagnosis present

## 2020-01-08 DIAGNOSIS — Z887 Allergy status to serum and vaccine status: Secondary | ICD-10-CM | POA: Diagnosis not present

## 2020-01-08 DIAGNOSIS — Z87891 Personal history of nicotine dependence: Secondary | ICD-10-CM | POA: Diagnosis not present

## 2020-01-08 DIAGNOSIS — Z833 Family history of diabetes mellitus: Secondary | ICD-10-CM | POA: Diagnosis not present

## 2020-01-08 DIAGNOSIS — Z8349 Family history of other endocrine, nutritional and metabolic diseases: Secondary | ICD-10-CM | POA: Diagnosis not present

## 2020-01-08 DIAGNOSIS — Z79899 Other long term (current) drug therapy: Secondary | ICD-10-CM | POA: Diagnosis not present

## 2020-01-08 DIAGNOSIS — I10 Essential (primary) hypertension: Secondary | ICD-10-CM

## 2020-01-08 DIAGNOSIS — M8580 Other specified disorders of bone density and structure, unspecified site: Secondary | ICD-10-CM | POA: Diagnosis present

## 2020-01-08 DIAGNOSIS — Z809 Family history of malignant neoplasm, unspecified: Secondary | ICD-10-CM | POA: Diagnosis not present

## 2020-01-08 MED ORDER — SODIUM CHLORIDE 0.9 % WEIGHT BASED INFUSION
1.0000 mL/kg/h | INTRAVENOUS | Status: DC
  Administered 2020-01-09: 250 mL via INTRAVENOUS

## 2020-01-08 MED ORDER — HYDROCHLOROTHIAZIDE 25 MG PO TABS: 25.0000 mg | ORAL_TABLET | Freq: Every day | ORAL | Status: DC

## 2020-01-08 MED ORDER — ASPIRIN 81 MG PO CHEW
81.0000 mg | CHEWABLE_TABLET | ORAL | Status: AC
  Administered 2020-01-09: 05:00:00 81 mg via ORAL
  Filled 2020-01-08: qty 1

## 2020-01-08 MED ORDER — SODIUM CHLORIDE 0.9% FLUSH
3.0000 mL | Freq: Two times a day (BID) | INTRAVENOUS | Status: DC
  Administered 2020-01-08 – 2020-01-09 (×2): 3 mL via INTRAVENOUS

## 2020-01-08 MED ORDER — SODIUM CHLORIDE 0.9% FLUSH: 3.0000 mL | INTRAVENOUS | Status: DC | PRN

## 2020-01-08 MED ORDER — SODIUM CHLORIDE 0.9 % WEIGHT BASED INFUSION
3.0000 mL/kg/h | INTRAVENOUS | Status: DC
  Administered 2020-01-09: 04:00:00 3 mL/kg/h via INTRAVENOUS

## 2020-01-08 MED ORDER — SODIUM CHLORIDE 0.9 % IV SOLN: 250.0000 mL | INTRAVENOUS | Status: DC | PRN

## 2020-01-08 MED ORDER — CARVEDILOL 3.125 MG PO TABS
3.1250 mg | ORAL_TABLET | Freq: Two times a day (BID) | ORAL | Status: DC
  Administered 2020-01-08 – 2020-01-09 (×2): 3.125 mg via ORAL
  Filled 2020-01-08 (×2): qty 1

## 2020-01-08 NOTE — Progress Notes (Signed)
PROGRESS NOTE  Stacey Moody [id number] DOB: 12/24/1945 DOA: 01/06/2020 PCP: Susy Frizzle, MD  HPI/Recap of past 60 hours: 74 year old female with past medical history significant for GERD, hypertension, hyperlipidemia,.  Patient was admitted at [facility] where she presented with  chest pain that started yesterday. -Patient endorsed left-sided pressure-like chest pain, with associated nausea, shortness of breath and diaphoresis.  Left arm heaviness is also reported. -Troponin has been negative on 2 occasions. -EKG is not typical for acute coronary syndrome. -Patient was transferred  to [facility]  last night for further work-up of the chest pain.  Subjective: Seen and examined at bedside.  Patient denies any chest pain at this moment but stated that around 7 AM this morning she had.  Her daughter Stacey Moody is at the bedside  Assessment/Plan: Active Problems:   Chest pain  1.  Chest pain.  This sounds atypical troponin is negative x3 EKG is not typical for acute coronary syndrome. I have consulted with cardiology this morning and they will see her to decide further work-up continue carvedilol and aspirin and rosuvastatin  2. hypertension continue amlodipine and hydrochlorothiazide as well as losartan  3.  Continue rosuvastatin from home    Code Status: Full  Severity of Illness: The appropriate patient status for this patient is INPATIENT. Inpatient status is judged to be reasonable and necessary in order to provide the required intensity of service to ensure the patient's safety. The patient's presenting symptoms, physical exam findings, and initial radiographic and laboratory data in the context of their chronic comorbidities is felt to place them at high risk for further clinical deterioration. Furthermore, it is not anticipated that the patient will be medically stable for discharge from the hospital within 2 midnights of admission. The following factors  support the patient status of inpatient.   " The patient's presenting symptoms include chest pain. " The worrisome physical exam findings include normal. " The initial radiographic and laboratory data are worrisome because of EKG and enzymes were normal. " The chronic co-morbidities include hyperlipidemia hypertension carotid artery occlusion.   * I certify that at the point of admission it is my clinical judgment that the patient will require inpatient hospital care spanning beyond 2 midnights from the point of admission due to high intensity of service, high risk for further deterioration and high frequency of surveillance required.*    Family Communication: Her daughter Stacey Moody was at bedside    Disposition Plan: Home after stress test per cardiology   Consultants:  Cardiology  Procedures:  None  Antimicrobials:  None  DVT prophylaxis: Lovenox   Objective: Vitals:   01/07/20 2143 01/08/20 0500 01/08/20 0539 01/08/20 0844  BP: (!) 129/55  (!) 148/56 (!) 112/44  Pulse: 80  (!) 57 65  Resp: 18  18   Temp: 98.3 F (36.8 C)  98.4 F (36.9 C)   TempSrc: Oral  Oral   SpO2: 96%  99%   Weight:  68.6 kg    Height:        Intake/Output Summary (Last 24 hours) at 01/08/2020 0918 Last data filed at 01/07/2020 1329 Gross per 24 hour  Intake 200 ml  Output --  Net 200 ml   Filed Weights   01/06/20 1915 01/07/20 2142 01/08/20 0500  Weight: 69.4 kg 68.6 kg 68.6 kg   Body mass index is 26.79 kg/m.  Exam:   General: 74 y.o. year-old female well developed well nourished in no acute distress.  Alert  and oriented x3.  Cardiovascular: Regular rate and rhythm with no rubs or gallops.  No thyromegaly or JVD noted.    Respiratory: Clear to auscultation with no wheezes or rales. Good inspiratory effort.  Abdomen: Soft nontender nondistended with normal bowel sounds x4 quadrants.  Musculoskeletal: No lower extremity edema. 2/4 pulses in all 4 extremities.  Skin: No  ulcerative lesions noted or rashes,  Psychiatry: Mood is appropriate for condition and setting    Data Reviewed: CBC: Recent Labs  Lab 01/06/20 2023 01/07/20 0706  WBC 8.4 7.5  HGB 12.5 12.4  HCT 37.6 37.5  MCV 94.9 94.5  PLT 239 [id number]   Basic Metabolic Panel: Recent Labs  Lab 01/06/20 2023 01/07/20 0706  NA 138 139  K 3.6 3.5  CL 106 104  CO2 23 25  GLUCOSE 190* 122*  BUN 14 12  CREATININE 0.70 0.65  CALCIUM 9.3 9.5   GFR: Estimated Creatinine Clearance: 57.4 mL/min (by C-G formula based on SCr of 0.65 mg/dL). Liver Function Tests: Recent Labs  Lab 01/07/20 0706  AST 21  ALT 19  ALKPHOS 59  BILITOT 0.7  PROT 7.0  ALBUMIN 4.2   No results for input(s): LIPASE, AMYLASE in the last 168 hours. No results for input(s): AMMONIA in the last 168 hours. Coagulation Profile: No results for input(s): INR, PROTIME in the last 168 hours. Cardiac Enzymes: No results for input(s): CKTOTAL, CKMB, CKMBINDEX, TROPONINI in the last 168 hours. BNP (last 3 results) No results for input(s): PROBNP in the last 8760 hours. HbA1C: Recent Labs    01/07/20 0706  HGBA1C 6.3*   CBG: Recent Labs  Lab 01/06/20 2030  GLUCAP 178*   Lipid Profile: Recent Labs    01/07/20 0706  CHOL 131  HDL 64  LDLCALC 54  TRIG 63  CHOLHDL 2.0   Thyroid Function Tests: No results for input(s): TSH, T4TOTAL, FREET4, T3FREE, THYROIDAB in the last 72 hours. Anemia Panel: No results for input(s): VITAMINB12, FOLATE, FERRITIN, TIBC, IRON, RETICCTPCT in the last 72 hours. Urine analysis:    Component Value Date/Time   COLORURINE STRAW (A) 12/01/2017 2200   APPEARANCEUR CLEAR 12/01/2017 2200   LABSPEC 1.004 (L) 12/01/2017 2200   PHURINE 6.0 12/01/2017 2200   GLUCOSEU NEGATIVE 12/01/2017 2200   HGBUR NEGATIVE 12/01/2017 2200   BILIRUBINUR NEGATIVE 12/01/2017 2200   KETONESUR NEGATIVE 12/01/2017 2200   PROTEINUR NEGATIVE 12/01/2017 2200   UROBILINOGEN 1.0 12/29/2014 1217   NITRITE  NEGATIVE 12/01/2017 2200   LEUKOCYTESUR TRACE (A) 12/01/2017 2200   Sepsis Labs: @LABRCNTIP (procalcitonin:4,lacticidven:4)  ) Recent Results (from the past 240 hour(s))  SARS CORONAVIRUS 2 (TAT 6-24 HRS) Nasopharyngeal Nasopharyngeal Swab     Status: None   Collection Time: 01/06/20  7:56 PM   Specimen: Nasopharyngeal Swab  Result Value Ref Range Status   SARS Coronavirus 2 NEGATIVE NEGATIVE Final    Comment: (NOTE) SARS-CoV-2 target nucleic acids are NOT DETECTED. The SARS-CoV-2 RNA is generally detectable in upper and lower respiratory specimens during the acute phase of infection. Negative results do not preclude SARS-CoV-2 infection, do not rule out co-infections with other pathogens, and should not be used as the sole basis for treatment or other patient management decisions. Negative results must be combined with clinical observations, patient history, and epidemiological information. The expected result is Negative. Fact Sheet for Patients: SugarRoll.be Fact Sheet for Healthcare Providers: https://www.woods-mathews.com/ This test is not yet approved or cleared by the [place] FDA and  has been authorized for detection  and/or diagnosis of SARS-CoV-2 by FDA under an Emergency Use Authorization (EUA). This EUA will remain  in effect (meaning this test can be used) for the duration of the COVID-19 declaration under Section 56 4(b)(1) of the Act, 21 U.S.C. section 360bbb-3(b)(1), unless the authorization is terminated or revoked sooner. Performed at [facility], [place] [address], [place], [place] [address]       Studies: ECHOCARDIOGRAM COMPLETE  Result Date: 01/07/2020   ECHOCARDIOGRAM REPORT   Patient Name:   LETTY EKINS Date of Exam: 01/07/2020 Medical Rec #:  [id number]    Height:       63.0 in Accession #:    [id number]   Weight:       153.0 lb Date of Birth:  19-Dec-1946     BSA:          1.73 m Patient Age:    84 years      BP:           140/61 mmHg Patient Gender: F            HR:           57 bpm. Exam Location:  [facility] Procedure: 2D Echo, Cardiac Doppler and Color Doppler Indications:    Chest pain  History:        Patient has prior history of Echocardiogram examinations, most                 recent 01/02/2014. Signs/Symptoms:Chest Pain; Risk                 Factors:Hypertension and Dyslipidemia.  Sonographer:    Dustin Flock RDCS Referring Phys: [place]  1. Left ventricular ejection fraction, by visual estimation, is 60 to 65%. The left ventricle has normal function. There is mildly increased left ventricular hypertrophy.  2. The left ventricle has no regional wall motion abnormalities.  3. Global right ventricle has normal systolic function.The right ventricular size is normal. No increase in right ventricular wall thickness.  4. Left atrial size was normal.  5. Right atrial size was normal.  6. The mitral valve is normal in structure. No evidence of mitral valve regurgitation. No evidence of mitral stenosis.  7. The tricuspid valve is normal in structure.  8. The aortic valve is normal in structure. Aortic valve regurgitation is not visualized. No evidence of aortic valve sclerosis or stenosis.  9. The pulmonic valve was normal in structure. Pulmonic valve regurgitation is not visualized. 10. TR signal is inadequate for assessing pulmonary artery systolic pressure. 11. The inferior vena cava is normal in size with greater than 50% respiratory variability, suggesting right atrial pressure of 3 mmHg. FINDINGS  Left Ventricle: Left ventricular ejection fraction, by visual estimation, is 60 to 65%. The left ventricle has normal function. The left ventricle has no regional wall motion abnormalities. The left ventricular internal cavity size was the left ventricle is normal in size. There is mildly increased left ventricular hypertrophy. Concentric left ventricular hypertrophy. Indeterminate filling  pressures. Right Ventricle: The right ventricular size is normal. No increase in right ventricular wall thickness. Global RV systolic function is has normal systolic function. Left Atrium: Left atrial size was normal in size. Right Atrium: Right atrial size was normal in size Pericardium: There is no evidence of pericardial effusion. Mitral Valve: The mitral valve is normal in structure. No evidence of mitral valve regurgitation. No evidence of mitral valve stenosis by observation. Tricuspid Valve: The tricuspid valve  is normal in structure. Tricuspid valve regurgitation is not demonstrated. Aortic Valve: The aortic valve is normal in structure. Aortic valve regurgitation is not visualized. The aortic valve is structurally normal, with no evidence of sclerosis or stenosis. Pulmonic Valve: The pulmonic valve was normal in structure. Pulmonic valve regurgitation is not visualized. Pulmonic regurgitation is not visualized. Aorta: The aortic root, ascending aorta and aortic arch are all structurally normal, with no evidence of dilitation or obstruction. Venous: The inferior vena cava is normal in size with greater than 50% respiratory variability, suggesting right atrial pressure of 3 mmHg. IAS/Shunts: No atrial level shunt detected by color flow Doppler. There is no evidence of a patent foramen ovale. No ventricular septal defect is seen or detected. There is no evidence of an atrial septal defect.  LEFT VENTRICLE PLAX 2D LVIDd:         3.99 cm  Diastology LVIDs:         2.07 cm  LV e' lateral:   8.16 cm/s LV PW:         1.17 cm  LV E/e' lateral: 10.0 LV IVS:        1.25 cm  LV e' medial:    4.90 cm/s LVOT diam:     1.90 cm  LV E/e' medial:  16.7 LV SV:         56 ml LV SV Index:   31.39 LVOT Area:     2.84 cm  RIGHT VENTRICLE RV Basal diam:  2.86 cm RV S prime:     11.40 cm/s TAPSE (M-mode): 2.8 cm LEFT ATRIUM           Index       RIGHT ATRIUM           Index LA diam:      2.80 cm 1.62 cm/m  RA Area:     17.70 cm  LA Vol (A4C): 36.2 ml 20.98 ml/m RA Volume:   51.50 ml  29.85 ml/m  AORTIC VALVE LVOT Vmax:   109.00 cm/s LVOT Vmean:  83.600 cm/s LVOT VTI:    0.276 m  AORTA Ao Root diam: 2.60 cm MITRAL VALVE MV Area (PHT): 2.87 cm              SHUNTS MV PHT:        76.56 msec            Systemic VTI:  0.28 m MV Decel Time: 264 msec              Systemic Diam: 1.90 cm MV E velocity: 82.00 cm/s  103 cm/s MV A velocity: 101.00 cm/s 70.3 cm/s MV E/A ratio:  0.81        1.5  Mihai Croitoru MD Electronically signed by Sanda Klein MD Signature Date/Time: 01/07/2020/3:59:54 PM    Final     Scheduled Meds:  amLODipine  5 mg Oral Daily   aspirin EC  81 mg Oral Daily   enoxaparin (LOVENOX) injection  40 mg Subcutaneous Q24H   hydrochlorothiazide  25 mg Oral Daily   losartan  100 mg Oral Daily   rosuvastatin  20 mg Oral Daily   cholecalciferol  1,000 Units Oral Daily    Continuous Infusions:   LOS: 0 days     Cristal Deer, MD [facility]  To reach me or the doctor on call, go to: [email or web address] Password [facility]  01/08/2020, 9:18 AM

## 2020-01-08 NOTE — Consult Note (Addendum)
Cardiology Consult    Patient ID: Stacey Moody; [id number]; 1946/12/18   Admit date: 01/06/2020 Date of Consult: 01/08/2020  Primary Care Provider: Susy Frizzle, MD Primary Cardiologist: Stacey Moody  Patient Profile    Stacey Moody is a 74 y.o. female with past medical history of HTN, HLD carotid artery stenosis and possible renal artery stenosis (by prior imaging in 11/2017) who is being seen today for the evaluation of chest pain at the request of Dr. Kyung Bacca.   History of Present Illness    Ms. Bashline presented to [facility] ED on 01/06/2020 for evaluation of centralized chest pain with associated nausea and diaphoresis. In talking with the patient today, she reports having intermittent episodes of nausea and dizziness for the past few weeks which she thought was secondary to known vertigo. The day prior to admission, she developed episodes of chest pressure while re-doing the floors in her home. Symptoms would improve with rest but she did experience nausea and diaphoresis. Also reports discomfort in her left arm. Denies any recent fever, chills, dyspnea, orthopnea, PND, edema or palpitations.   She denies any known history of CAD or CHF. Does have a strong family history of CAD in both of her parents. She is a former smoker but quit in 1998.  Initial labs show WBC 8.4, Hgb 12.5, platelets 239, Na+ 139, K+ 3.5 and creatinine 0.65. Initial and repeat HS Troponin values negative at 3. COVID negative. Hgb A1c at 6.3. FLP shows total cholesterol of 131, triglycerides 63, HDL 64 and LDL 54. CXR with no active disease. EKG shows NSR, HR 81 with no acute ST abnormalities when compared to prior tracings.   She reports having an episode of brief chest pain at 0800 this AM which spontaneously resolved. Did not require SL NTG.   Past Medical History:  Diagnosis Date   Carotid artery stenosis    GERD (gastroesophageal reflux disease)    Hyperlipidemia    Hypertension    Influenza A  01/01/2014   Osteopenia    T-2.3   Right renal artery stenosis (HCC)    Syncope 12/31/2013   Vasovagal or hypovolemia/orthostatic    Past Surgical History:  Procedure Laterality Date   ABDOMINAL HYSTERECTOMY  1982   Partial hysterectomy   COLONOSCOPY N/A 07/30/2016   Procedure: COLONOSCOPY;  Surgeon: Daneil Dolin, MD;  Location: [facility];  Service: Endoscopy;  Laterality: N/A;  8:30 AM   CT SCAN  July 2015   Chest   POLYPECTOMY  07/30/2016   Procedure: POLYPECTOMY;  Surgeon: Daneil Dolin, MD;  Location: [facility];  Service: Endoscopy;;  descending colon   SMALL INTESTINE SURGERY       Home Medications:  Prior to Admission medications   Medication Sig Start Date End Date Taking? Authorizing Provider  amLODipine (NORVASC) 5 MG tablet TAKE 1 TABLET(5 MG) BY MOUTH DAILY 09/19/19  Yes Stacey Frizzle, MD  aspirin EC 81 MG tablet Take 81 mg by mouth daily.   Yes [provider]  cholecalciferol (VITAMIN D) 1000 UNITS tablet Take 1,000 Units by mouth daily.   Yes [provider]  hydrochlorothiazide (HYDRODIURIL) 25 MG tablet TAKE 1 TABLET BY MOUTH EVERY DAY 09/19/19  Yes Stacey Frizzle, MD  losartan (COZAAR) 100 MG tablet TAKE 1 TABLET(100 MG) BY MOUTH DAILY 09/19/19  Yes Stacey Frizzle, MD  rosuvastatin (CRESTOR) 20 MG tablet TAKE 1 TABLET BY MOUTH EVERY DAY 09/19/19  Yes Stacey Frizzle, MD    Inpatient Medications: Scheduled Meds:  amLODipine  5 mg Oral Daily   aspirin EC  81 mg Oral Daily   enoxaparin (LOVENOX) injection  40 mg Subcutaneous Q24H   hydrochlorothiazide  25 mg Oral Daily   losartan  100 mg Oral Daily   rosuvastatin  20 mg Oral Daily   cholecalciferol  1,000 Units Oral Daily   Continuous Infusions:   PRN Meds: acetaminophen **OR** acetaminophen, fluticasone, nitroGLYCERIN  Allergies:    Allergies  Allergen Reactions   Tetanus Toxoids Swelling    Social History:   Social History   Socioeconomic History   Marital status:  Widowed    Spouse name: Not on file   Number of children: Not on file   Years of education: Not on file   Highest education level: Not on file  Occupational History   Not on file  Tobacco Use   Smoking status: Former Smoker    Quit date: 12/22/1996    Years since quitting: 23.0   Smokeless tobacco: Never Used  Substance and Sexual Activity   Alcohol use: No   Drug use: No   Sexual activity: Not on file  Other Topics Concern   Not on file  Social History Narrative   Not on file   Social Determinants of Health   Financial Resource Strain:    Difficulty of Paying Living Expenses: Not on file  Food Insecurity:    Worried About Charity fundraiser in the Last Year: Not on file   [employer] of Food in the Last Year: Not on file  Transportation Needs:    Lack of Transportation (Medical): Not on file   Lack of Transportation (Non-Medical): Not on file  Physical Activity:    Days of Exercise per Week: Not on file   Minutes of Exercise per Session: Not on file  Stress:    Feeling of Stress : Not on file  Social Connections:    Frequency of Communication with Friends and Family: Not on file   Frequency of Social Gatherings with Friends and Family: Not on file   Attends Religious Services: Not on file   Active Member of Clubs or Organizations: Not on file   Attends Archivist Meetings: Not on file   Marital Status: Not on file  Intimate Partner Violence:    Fear of Current or Ex-Partner: Not on file   Emotionally Abused: Not on file   Physically Abused: Not on file   Sexually Abused: Not on file     Family History:    Family History  Problem Relation Age of Onset   Cancer Mother        pancratic   Heart attack Mother    Cancer Father        stomach   Diabetes Father    Hyperlipidemia Father    Heart disease Father        NOT  before age 62   Cancer Sister        lung   Hyperlipidemia Sister    Cancer Brother        thyroid, prostate   Hyperlipidemia  Brother    Heart attack Brother    Cancer Sister        colon   Hyperlipidemia Sister    Cancer Sister        colon   Hyperlipidemia Sister       Review of Systems    General:  No chills, fever, night sweats or weight changes.  Cardiovascular:  No dyspnea on exertion, edema, orthopnea, palpitations, paroxysmal nocturnal dyspnea. Positive for chest pain.  Dermatological: No rash, lesions/masses Respiratory: No cough, dyspnea Urologic: No hematuria, dysuria Abdominal:   No nausea, vomiting, diarrhea, bright red blood per rectum, melena, or hematemesis Neurologic:  No visual changes, wkns, changes in mental status. All other systems reviewed and are otherwise negative except as noted above.  Physical Exam/Data    Vitals:   01/07/20 2143 01/08/20 0500 01/08/20 0539 01/08/20 0844  BP: (!) 129/55  (!) 148/56 (!) 112/44  Pulse: 80  (!) 57 65  Resp: 18  18   Temp: 98.3 F (36.8 C)  98.4 F (36.9 C)   TempSrc: Oral  Oral   SpO2: 96%  99%   Weight:  68.6 kg    Height:        Intake/Output Summary (Last 24 hours) at 01/08/2020 0919 Last data filed at 01/07/2020 1329 Gross per 24 hour  Intake 200 ml  Output --  Net 200 ml   Filed Weights   01/06/20 1915 01/07/20 2142 01/08/20 0500  Weight: 69.4 kg 68.6 kg 68.6 kg   Body mass index is 26.79 kg/m.   General: Pleasant female appearing in NAD Psych: Normal affect. Neuro: Alert and oriented X 3. Moves all extremities spontaneously. HEENT: Normal  Neck: Supple without bruits or JVD. Lungs:  Resp regular and unlabored, CTA without wheezing or rales. Heart: RRR no s3, s4, or murmurs. Abdomen: Soft, non-tender, non-distended, BS + x 4.  Extremities: No clubbing, cyanosis or edema. DP/PT/Radials 2+ and equal bilaterally.   EKG:  The EKG was personally reviewed and demonstrates: NSR, HR 81 with no acute ST abnormalities when compared to prior tracings   Labs/Studies     Relevant CV Studies:  Echocardiogram:  01/07/2020 IMPRESSIONS      1. Left ventricular ejection fraction, by visual estimation, is 60 to 65%. The left ventricle has normal function. There is mildly increased left ventricular hypertrophy.  2. The left ventricle has no regional wall motion abnormalities.  3. Global right ventricle has normal systolic function.The right ventricular size is normal. No increase in right ventricular wall thickness.  4. Left atrial size was normal.  5. Right atrial size was normal.  6. The mitral valve is normal in structure. No evidence of mitral valve regurgitation. No evidence of mitral stenosis.  7. The tricuspid valve is normal in structure.  8. The aortic valve is normal in structure. Aortic valve regurgitation is not visualized. No evidence of aortic valve sclerosis or stenosis.  9. The pulmonic valve was normal in structure. Pulmonic valve regurgitation is not visualized. 10. TR signal is inadequate for assessing pulmonary artery systolic pressure. 11. The inferior vena cava is normal in size with greater than 50% respiratory variability, suggesting right atrial pressure of 3 mmHg.  Carotid Dopplers: 07/2018 Final Interpretation: Right Carotid: Velocities in the right ICA are consistent with a 1-39% stenosis.                The ECA appears >50% stenosed.   Left Carotid: Velocities in the left ICA are consistent with a 40-59% stenosis.   Vertebrals:  Bilateral vertebral arteries demonstrate antegrade flow. Subclavians: Normal flow hemodynamics were seen in bilateral subclavian              arteries.  Laboratory Data:  Chemistry Recent Labs  Lab 01/06/20 2023 01/07/20 0706  NA 138 139  K  3.6 3.5  CL 106 104  CO2 23 25  GLUCOSE 190* 122*  BUN 14 12  CREATININE 0.70 0.65  CALCIUM 9.3 9.5  GFRNONAA >60 >60  GFRAA >60 >60  ANIONGAP 9 10    Recent Labs  Lab 01/07/20 0706  PROT 7.0  ALBUMIN 4.2  AST 21  ALT 19  ALKPHOS 59  BILITOT 0.7   Hematology Recent Labs  Lab  01/06/20 2023 01/07/20 0706  WBC 8.4 7.5  RBC 3.96 3.97  HGB 12.5 12.4  HCT 37.6 37.5  MCV 94.9 94.5  MCH 31.6 31.2  MCHC 33.2 33.1  RDW 12.1 12.1  PLT 239 236   Cardiac EnzymesNo results for input(s): TROPONINI in the last 168 hours. No results for input(s): TROPIPOC in the last 168 hours.  BNPNo results for input(s): BNP, PROBNP in the last 168 hours.  DDimer No results for input(s): DDIMER in the last 168 hours.  Radiology/Studies:  DG Chest Portable 1 View  Result Date: 01/06/2020 CLINICAL DATA:  Chest pain EXAM: PORTABLE CHEST 1 VIEW COMPARISON:  12/31/2013 FINDINGS: Cardiac shadows within normal limits. The lungs are well aerated bilaterally. No focal infiltrate is seen. Mild aortic calcifications are noted. Bony structures are unremarkable. IMPRESSION: No active disease. Electronically Signed   By: Inez Catalina M.D.   On: 01/06/2020 20:25   ECHOCARDIOGRAM COMPLETE  Result Date: 01/07/2020   ECHOCARDIOGRAM REPORT   Patient Name:   MARLIENE GIFFEN Date of Exam: 01/07/2020 Medical Rec #:  [id number]    Height:       63.0 in Accession #:    [id number]   Weight:       153.0 lb Date of Birth:  1946/09/01     BSA:          1.73 m Patient Age:    16 years     BP:           140/61 mmHg Patient Gender: F            HR:           57 bpm. Exam Location:  [facility] Procedure: 2D Echo, Cardiac Doppler and Color Doppler Indications:    Chest pain  History:        Patient has prior history of Echocardiogram examinations, most                 recent 01/02/2014. Signs/Symptoms:Chest Pain; Risk                 Factors:Hypertension and Dyslipidemia.  Sonographer:    Dustin Flock RDCS Referring Phys: [place]  1. Left ventricular ejection fraction, by visual estimation, is 60 to 65%. The left ventricle has normal function. There is mildly increased left ventricular hypertrophy.  2. The left ventricle has no regional wall motion abnormalities.  3. Global right ventricle has normal systolic  function.The right ventricular size is normal. No increase in right ventricular wall thickness.  4. Left atrial size was normal.  5. Right atrial size was normal.  6. The mitral valve is normal in structure. No evidence of mitral valve regurgitation. No evidence of mitral stenosis.  7. The tricuspid valve is normal in structure.  8. The aortic valve is normal in structure. Aortic valve regurgitation is not visualized. No evidence of aortic valve sclerosis or stenosis.  9. The pulmonic valve was normal in structure. Pulmonic valve regurgitation is not visualized. 10. TR signal is inadequate for assessing pulmonary artery systolic  pressure. 11. The inferior vena cava is normal in size with greater than 50% respiratory variability, suggesting right atrial pressure of 3 mmHg. FINDINGS  Left Ventricle: Left ventricular ejection fraction, by visual estimation, is 60 to 65%. The left ventricle has normal function. The left ventricle has no regional wall motion abnormalities. The left ventricular internal cavity size was the left ventricle is normal in size. There is mildly increased left ventricular hypertrophy. Concentric left ventricular hypertrophy. Indeterminate filling pressures. Right Ventricle: The right ventricular size is normal. No increase in right ventricular wall thickness. Global RV systolic function is has normal systolic function. Left Atrium: Left atrial size was normal in size. Right Atrium: Right atrial size was normal in size Pericardium: There is no evidence of pericardial effusion. Mitral Valve: The mitral valve is normal in structure. No evidence of mitral valve regurgitation. No evidence of mitral valve stenosis by observation. Tricuspid Valve: The tricuspid valve is normal in structure. Tricuspid valve regurgitation is not demonstrated. Aortic Valve: The aortic valve is normal in structure. Aortic valve regurgitation is not visualized. The aortic valve is structurally normal, with no evidence of  sclerosis or stenosis. Pulmonic Valve: The pulmonic valve was normal in structure. Pulmonic valve regurgitation is not visualized. Pulmonic regurgitation is not visualized. Aorta: The aortic root, ascending aorta and aortic arch are all structurally normal, with no evidence of dilitation or obstruction. Venous: The inferior vena cava is normal in size with greater than 50% respiratory variability, suggesting right atrial pressure of 3 mmHg. IAS/Shunts: No atrial level shunt detected by color flow Doppler. There is no evidence of a patent foramen ovale. No ventricular septal defect is seen or detected. There is no evidence of an atrial septal defect.  LEFT VENTRICLE PLAX 2D LVIDd:         3.99 cm  Diastology LVIDs:         2.07 cm  LV e' lateral:   8.16 cm/s LV PW:         1.17 cm  LV E/e' lateral: 10.0 LV IVS:        1.25 cm  LV e' medial:    4.90 cm/s LVOT diam:     1.90 cm  LV E/e' medial:  16.7 LV SV:         56 ml LV SV Index:   31.39 LVOT Area:     2.84 cm  RIGHT VENTRICLE RV Basal diam:  2.86 cm RV S prime:     11.40 cm/s TAPSE (M-mode): 2.8 cm LEFT ATRIUM           Index       RIGHT ATRIUM           Index LA diam:      2.80 cm 1.62 cm/m  RA Area:     17.70 cm LA Vol (A4C): 36.2 ml 20.98 ml/m RA Volume:   51.50 ml  29.85 ml/m  AORTIC VALVE LVOT Vmax:   109.00 cm/s LVOT Vmean:  83.600 cm/s LVOT VTI:    0.276 m  AORTA Ao Root diam: 2.60 cm MITRAL VALVE MV Area (PHT): 2.87 cm              SHUNTS MV PHT:        76.56 msec            Systemic VTI:  0.28 m MV Decel Time: 264 msec              Systemic Diam: 1.90 cm MV E  velocity: 82.00 cm/s  103 cm/s MV A velocity: 101.00 cm/s 70.3 cm/s MV E/A ratio:  0.81        1.5  Mihai Croitoru MD Electronically signed by Sanda Klein MD Signature Date/Time: 01/07/2020/3:59:54 PM    Final      Assessment & Plan    1. Chest Pain concerning for Angina - presented with episodes of centralized chest pain with associated nausea, diaphoresis and pain down her left arm.   - Initial and repeat HS Troponin values negative at 3. EKG shows NSR, HR 81 with no acute ST abnormalities when compared to prior tracings. She does have multiple cardiac risk factors including PVD, HTN, HLD, family history of CAD and prior tobacco use.  - discussed with Dr. Johnsie Moody and will plan for cardiac catheterization tomorrow for definitive evaluation. Keep NPO after midnight.   2. HTN - BP initially elevated but improved to 112/44 on most recent check. Continue PTA medication regimen.   3. HLD - FLP shows total cholesterol of 131, triglycerides 63, HDL 64 and LDL 54. Continue Crestor 20mg  daily.    4. PVD - she has a history of carotid artery stenosis and possible renal artery stenosis (by prior imaging in 11/2017) which is followed by Vascular Surgery as an outpatient.  - remains on ASA and statin therapy.    For questions or updates, please contact [place] Please consult [email or web address] for contact info under Cardiology/STEMI.  Signed, Erma Heritage, PA-C 01/08/2020, 10:50 AM Pager: [phone]  Patient examined chart reviewed. 74 y.o. with known vascular disease and right carotid bruit Multiple CRF;s including HTN, HLD. SSCP worrisome for angina with exertional component and relief with nitro ECG with nonspecific ST changes R/O Discussed options with patient and favor diagnostic heart cath in am. Risks including bleeding, stroke MI need for emergency CABG discussed willing to proceed Cr ok She has not had contrast before. Exam with right carotid burit no murmur clear lungs BS positive palpable DP bilaterally Placed on cath schedule for am NPO orders written will d/c normvasc and start beta blocker continue statin ASA Add heparin for any further chest pain   Jenkins Rouge MD [facility]

## 2020-01-08 NOTE — H&P (View-Only) (Signed)
Cardiology Consult    Patient ID: Stacey Moody; [id number]; [birth year]   Admit date: 01/06/2020 Date of Consult: 01/08/2020  Primary Care Provider: Susy Frizzle, MD Primary Cardiologist: New to [facility] - Stacey Moody  Patient Profile    Stacey Moody is a 74 y.o. female with past medical history of HTN, HLD carotid artery stenosis and possible renal artery stenosis (by prior imaging in 11/2017) who is being seen today for the evaluation of chest pain at the request of Stacey Moody.   History of Present Illness    Stacey Moody presented to [facility] ED on 01/06/2020 for evaluation of centralized chest pain with associated nausea and diaphoresis. In talking with the patient today, she reports having intermittent episodes of nausea and dizziness for the past few weeks which she thought was secondary to known vertigo. The day prior to admission, she developed episodes of chest pressure while re-doing the floors in her home. Symptoms would improve with rest but she did experience nausea and diaphoresis. Also reports discomfort in her left arm. Denies any recent fever, chills, dyspnea, orthopnea, PND, edema or palpitations.   She denies any known history of CAD or CHF. Does have a strong family history of CAD in both of her parents. She is a former smoker but quit in 1998.  Initial labs show WBC 8.4, Hgb 12.5, platelets 239, Na+ 139, K+ 3.5 and creatinine 0.65. Initial and repeat HS Troponin values negative at 3. COVID negative. Hgb A1c at 6.3. FLP shows total cholesterol of 131, triglycerides 63, HDL 64 and LDL 54. CXR with no active disease. EKG shows NSR, HR 81 with no acute ST abnormalities when compared to prior tracings.   She reports having an episode of brief chest pain at 0800 this AM which spontaneously resolved. Did not require SL NTG.   Past Medical History:  Diagnosis Date   Carotid artery stenosis    GERD (gastroesophageal reflux disease)    Hyperlipidemia    Hypertension    Influenza A  01/01/2014   Osteopenia    T-2.3   Right renal artery stenosis (HCC)    Syncope 12/31/2013   Vasovagal or hypovolemia/orthostatic    Past Surgical History:  Procedure Laterality Date   ABDOMINAL HYSTERECTOMY  1982   Partial hysterectomy   COLONOSCOPY N/A 07/30/2016   Procedure: COLONOSCOPY;  Surgeon: Daneil Dolin, MD;  Location: [facility];  Service: Endoscopy;  Laterality: N/A;  8:30 AM   CT SCAN  July 2015   Chest   POLYPECTOMY  07/30/2016   Procedure: POLYPECTOMY;  Surgeon: Daneil Dolin, MD;  Location: [facility];  Service: Endoscopy;;  descending colon   SMALL INTESTINE SURGERY       Home Medications:  Prior to Admission medications   Medication Sig Start Date End Date Taking? Authorizing Provider  amLODipine (NORVASC) 5 MG tablet TAKE 1 TABLET(5 MG) BY MOUTH DAILY 09/19/19  Yes Stacey Frizzle, MD  aspirin EC 81 MG tablet Take 81 mg by mouth daily.   Yes [provider]  cholecalciferol (VITAMIN D) 1000 UNITS tablet Take 1,000 Units by mouth daily.   Yes [provider]  hydrochlorothiazide (HYDRODIURIL) 25 MG tablet TAKE 1 TABLET BY MOUTH EVERY DAY 09/19/19  Yes Stacey Frizzle, MD  losartan (COZAAR) 100 MG tablet TAKE 1 TABLET(100 MG) BY MOUTH DAILY 09/19/19  Yes Stacey Frizzle, MD  rosuvastatin (CRESTOR) 20 MG tablet TAKE 1 TABLET BY MOUTH EVERY DAY 09/19/19  Yes Stacey Frizzle, MD    Inpatient Medications: Scheduled Meds:  amLODipine  5 mg Oral Daily   aspirin EC  81 mg Oral Daily   enoxaparin (LOVENOX) injection  40 mg Subcutaneous Q24H   hydrochlorothiazide  25 mg Oral Daily   losartan  100 mg Oral Daily   rosuvastatin  20 mg Oral Daily   cholecalciferol  1,000 Units Oral Daily   Continuous Infusions:   PRN Meds: acetaminophen **OR** acetaminophen, fluticasone, nitroGLYCERIN  Allergies:    Allergies  Allergen Reactions   Tetanus Toxoids Swelling    Social History:   Social History   Socioeconomic History   Marital status:  Widowed    Spouse name: Not on file   Number of children: Not on file   Years of education: Not on file   Highest education level: Not on file  Occupational History   Not on file  Tobacco Use   Smoking status: Former Smoker    Quit date: 12/22/1996    Years since quitting: 23.0   Smokeless tobacco: Never Used  Substance and Sexual Activity   Alcohol use: No   Drug use: No   Sexual activity: Not on file  Other Topics Concern   Not on file  Social History Narrative   Not on file   Social Determinants of Health   Financial Resource Strain:    Difficulty of Paying Living Expenses: Not on file  Food Insecurity:    Worried About Charity fundraiser in the Last Year: Not on file   [employer] of Food in the Last Year: Not on file  Transportation Needs:    Lack of Transportation (Medical): Not on file   Lack of Transportation (Non-Medical): Not on file  Physical Activity:    Days of Exercise per Week: Not on file   Minutes of Exercise per Session: Not on file  Stress:    Feeling of Stress : Not on file  Social Connections:    Frequency of Communication with Friends and Family: Not on file   Frequency of Social Gatherings with Friends and Family: Not on file   Attends Religious Services: Not on file   Active Member of Clubs or Organizations: Not on file   Attends Archivist Meetings: Not on file   Marital Status: Not on file  Intimate Partner Violence:    Fear of Current or Ex-Partner: Not on file   Emotionally Abused: Not on file   Physically Abused: Not on file   Sexually Abused: Not on file     Family History:    Family History  Problem Relation Age of Onset   Cancer Mother        pancratic   Heart attack Mother    Cancer Father        stomach   Diabetes Father    Hyperlipidemia Father    Heart disease Father        NOT  before age 52   Cancer Sister        lung   Hyperlipidemia Sister    Cancer Brother        thyroid, prostate   Hyperlipidemia  Brother    Heart attack Brother    Cancer Sister        colon   Hyperlipidemia Sister    Cancer Sister        colon   Hyperlipidemia Sister       Review of Systems    General:  No chills, fever, night sweats or weight changes.  Cardiovascular:  No dyspnea on exertion, edema, orthopnea, palpitations, paroxysmal nocturnal dyspnea. Positive for chest pain.  Dermatological: No rash, lesions/masses Respiratory: No cough, dyspnea Urologic: No hematuria, dysuria Abdominal:   No nausea, vomiting, diarrhea, bright red blood per rectum, melena, or hematemesis Neurologic:  No visual changes, wkns, changes in mental status. All other systems reviewed and are otherwise negative except as noted above.  Physical Exam/Data    Vitals:   01/07/20 2143 01/08/20 0500 01/08/20 0539 01/08/20 0844  BP: (!) 129/55  (!) 148/56 (!) 112/44  Pulse: 80  (!) 57 65  Resp: 18  18   Temp: 98.3 F (36.8 C)  98.4 F (36.9 C)   TempSrc: Oral  Oral   SpO2: 96%  99%   Weight:  68.6 kg    Height:        Intake/Output Summary (Last 24 hours) at 01/08/2020 0919 Last data filed at 01/07/2020 1329 Gross per 24 hour  Intake 200 ml  Output --  Net 200 ml   Filed Weights   01/06/20 1915 01/07/20 2142 01/08/20 0500  Weight: 69.4 kg 68.6 kg 68.6 kg   Body mass index is 26.79 kg/m.   General: Pleasant female appearing in NAD Psych: Normal affect. Neuro: Alert and oriented X 3. Moves all extremities spontaneously. HEENT: Normal  Neck: Supple without bruits or JVD. Lungs:  Resp regular and unlabored, CTA without wheezing or rales. Heart: RRR no s3, s4, or murmurs. Abdomen: Soft, non-tender, non-distended, BS + x 4.  Extremities: No clubbing, cyanosis or edema. DP/PT/Radials 2+ and equal bilaterally.   EKG:  The EKG was personally reviewed and demonstrates: NSR, HR 81 with no acute ST abnormalities when compared to prior tracings   Labs/Studies     Relevant CV Studies:  Echocardiogram:  01/07/2020 IMPRESSIONS      1. Left ventricular ejection fraction, by visual estimation, is 60 to 65%. The left ventricle has normal function. There is mildly increased left ventricular hypertrophy.  2. The left ventricle has no regional wall motion abnormalities.  3. Global right ventricle has normal systolic function.The right ventricular size is normal. No increase in right ventricular wall thickness.  4. Left atrial size was normal.  5. Right atrial size was normal.  6. The mitral valve is normal in structure. No evidence of mitral valve regurgitation. No evidence of mitral stenosis.  7. The tricuspid valve is normal in structure.  8. The aortic valve is normal in structure. Aortic valve regurgitation is not visualized. No evidence of aortic valve sclerosis or stenosis.  9. The pulmonic valve was normal in structure. Pulmonic valve regurgitation is not visualized. 10. TR signal is inadequate for assessing pulmonary artery systolic pressure. 11. The inferior vena cava is normal in size with greater than 50% respiratory variability, suggesting right atrial pressure of 3 mmHg.  Carotid Dopplers: 07/2018 Final Interpretation: Right Carotid: Velocities in the right ICA are consistent with a 1-39% stenosis.                The ECA appears >50% stenosed.   Left Carotid: Velocities in the left ICA are consistent with a 40-59% stenosis.   Vertebrals:  Bilateral vertebral arteries demonstrate antegrade flow. Subclavians: Normal flow hemodynamics were seen in bilateral subclavian              arteries.  Laboratory Data:  Chemistry Recent Labs  Lab 01/06/20 2023 01/07/20 0706  NA 138 139  K  3.6 3.5  CL 106 104  CO2 23 25  GLUCOSE 190* 122*  BUN 14 12  CREATININE 0.70 0.65  CALCIUM 9.3 9.5  GFRNONAA >60 >60  GFRAA >60 >60  ANIONGAP 9 10    Recent Labs  Lab 01/07/20 0706  PROT 7.0  ALBUMIN 4.2  AST 21  ALT 19  ALKPHOS 59  BILITOT 0.7   Hematology Recent Labs  Lab  01/06/20 2023 01/07/20 0706  WBC 8.4 7.5  RBC 3.96 3.97  HGB 12.5 12.4  HCT 37.6 37.5  MCV 94.9 94.5  MCH 31.6 31.2  MCHC 33.2 33.1  RDW 12.1 12.1  PLT 239 236   Cardiac EnzymesNo results for input(s): TROPONINI in the last 168 hours. No results for input(s): TROPIPOC in the last 168 hours.  BNPNo results for input(s): BNP, PROBNP in the last 168 hours.  DDimer No results for input(s): DDIMER in the last 168 hours.  Radiology/Studies:  DG Chest Portable 1 View  Result Date: 01/06/2020 CLINICAL DATA:  Chest pain EXAM: PORTABLE CHEST 1 VIEW COMPARISON:  12/31/2013 FINDINGS: Cardiac shadows within normal limits. The lungs are well aerated bilaterally. No focal infiltrate is seen. Mild aortic calcifications are noted. Bony structures are unremarkable. IMPRESSION: No active disease. Electronically Signed   By: Inez Catalina M.D.   On: 01/06/2020 20:25   ECHOCARDIOGRAM COMPLETE  Result Date: 01/07/2020   ECHOCARDIOGRAM REPORT   Patient Name:   Stacey Moody Date of Exam: 01/07/2020 Medical Rec #:  [id number]    Height:       63.0 in Accession #:    [id number]   Weight:       153.0 lb Date of Birth:  [birth year]     BSA:          1.73 m Patient Age:    32 years     BP:           140/61 mmHg Patient Gender: F            HR:           57 bpm. Exam Location:  [facility] Procedure: 2D Echo, Cardiac Doppler and Color Doppler Indications:    Chest pain  History:        Patient has prior history of Echocardiogram examinations, most                 recent 01/02/2014. Signs/Symptoms:Chest Pain; Risk                 Factors:Hypertension and Dyslipidemia.  Sonographer:    Dustin Flock RDCS Referring Phys: [place]  1. Left ventricular ejection fraction, by visual estimation, is 60 to 65%. The left ventricle has normal function. There is mildly increased left ventricular hypertrophy.  2. The left ventricle has no regional wall motion abnormalities.  3. Global right ventricle has normal systolic  function.The right ventricular size is normal. No increase in right ventricular wall thickness.  4. Left atrial size was normal.  5. Right atrial size was normal.  6. The mitral valve is normal in structure. No evidence of mitral valve regurgitation. No evidence of mitral stenosis.  7. The tricuspid valve is normal in structure.  8. The aortic valve is normal in structure. Aortic valve regurgitation is not visualized. No evidence of aortic valve sclerosis or stenosis.  9. The pulmonic valve was normal in structure. Pulmonic valve regurgitation is not visualized. 10. TR signal is inadequate for assessing pulmonary artery systolic  pressure. 11. The inferior vena cava is normal in size with greater than 50% respiratory variability, suggesting right atrial pressure of 3 mmHg. FINDINGS  Left Ventricle: Left ventricular ejection fraction, by visual estimation, is 60 to 65%. The left ventricle has normal function. The left ventricle has no regional wall motion abnormalities. The left ventricular internal cavity size was the left ventricle is normal in size. There is mildly increased left ventricular hypertrophy. Concentric left ventricular hypertrophy. Indeterminate filling pressures. Right Ventricle: The right ventricular size is normal. No increase in right ventricular wall thickness. Global RV systolic function is has normal systolic function. Left Atrium: Left atrial size was normal in size. Right Atrium: Right atrial size was normal in size Pericardium: There is no evidence of pericardial effusion. Mitral Valve: The mitral valve is normal in structure. No evidence of mitral valve regurgitation. No evidence of mitral valve stenosis by observation. Tricuspid Valve: The tricuspid valve is normal in structure. Tricuspid valve regurgitation is not demonstrated. Aortic Valve: The aortic valve is normal in structure. Aortic valve regurgitation is not visualized. The aortic valve is structurally normal, with no evidence of  sclerosis or stenosis. Pulmonic Valve: The pulmonic valve was normal in structure. Pulmonic valve regurgitation is not visualized. Pulmonic regurgitation is not visualized. Aorta: The aortic root, ascending aorta and aortic arch are all structurally normal, with no evidence of dilitation or obstruction. Venous: The inferior vena cava is normal in size with greater than 50% respiratory variability, suggesting right atrial pressure of 3 mmHg. IAS/Shunts: No atrial level shunt detected by color flow Doppler. There is no evidence of a patent foramen ovale. No ventricular septal defect is seen or detected. There is no evidence of an atrial septal defect.  LEFT VENTRICLE PLAX 2D LVIDd:         3.99 cm  Diastology LVIDs:         2.07 cm  LV e' lateral:   8.16 cm/s LV PW:         1.17 cm  LV E/e' lateral: 10.0 LV IVS:        1.25 cm  LV e' medial:    4.90 cm/s LVOT diam:     1.90 cm  LV E/e' medial:  16.7 LV SV:         56 ml LV SV Index:   31.39 LVOT Area:     2.84 cm  RIGHT VENTRICLE RV Basal diam:  2.86 cm RV S prime:     11.40 cm/s TAPSE (M-mode): 2.8 cm LEFT ATRIUM           Index       RIGHT ATRIUM           Index LA diam:      2.80 cm 1.62 cm/m  RA Area:     17.70 cm LA Vol (A4C): 36.2 ml 20.98 ml/m RA Volume:   51.50 ml  29.85 ml/m  AORTIC VALVE LVOT Vmax:   109.00 cm/s LVOT Vmean:  83.600 cm/s LVOT VTI:    0.276 m  AORTA Ao Root diam: 2.60 cm MITRAL VALVE MV Area (PHT): 2.87 cm              SHUNTS MV PHT:        76.56 msec            Systemic VTI:  0.28 m MV Decel Time: 264 msec              Systemic Diam: 1.90 cm MV E  velocity: 82.00 cm/s  103 cm/s MV A velocity: 101.00 cm/s 70.3 cm/s MV E/A ratio:  0.81        1.5  Mihai Croitoru MD Electronically signed by Sanda Klein MD Signature Date/Time: 01/07/2020/3:59:54 PM    Final      Assessment & Plan    1. Chest Pain concerning for Angina - presented with episodes of centralized chest pain with associated nausea, diaphoresis and pain down her left arm.   - Initial and repeat HS Troponin values negative at 3. EKG shows NSR, HR 81 with no acute ST abnormalities when compared to prior tracings. She does have multiple cardiac risk factors including PVD, HTN, HLD, family history of CAD and prior tobacco use.  - discussed with Stacey Moody and will plan for cardiac catheterization tomorrow for definitive evaluation. Keep NPO after midnight.   2. HTN - BP initially elevated but improved to 112/44 on most recent check. Continue PTA medication regimen.   3. HLD - FLP shows total cholesterol of 131, triglycerides 63, HDL 64 and LDL 54. Continue Crestor 20mg  daily.    4. PVD - she has a history of carotid artery stenosis and possible renal artery stenosis (by prior imaging in 11/2017) which is followed by Vascular Surgery as an outpatient.  - remains on ASA and statin therapy.    For questions or updates, please contact [place] Please consult [email or web address] for contact info under Cardiology/STEMI.  Signed, Erma Heritage, PA-C 01/08/2020, 10:50 AM Pager: [phone]  Patient examined chart reviewed. 74 y.o. with known vascular disease and right carotid bruit Multiple CRF;s including HTN, HLD. SSCP worrisome for angina with exertional component and relief with nitro ECG with nonspecific ST changes R/O Discussed options with patient and favor diagnostic heart cath in am. Risks including bleeding, stroke MI need for emergency CABG discussed willing to proceed Cr ok She has not had contrast before. Exam with right carotid burit no murmur clear lungs BS positive palpable DP bilaterally Placed on cath schedule for am NPO orders written will d/c normvasc and start beta blocker continue statin ASA Add heparin for any further chest pain   Jenkins Rouge MD [facility]

## 2020-01-09 ENCOUNTER — Encounter (HOSPITAL_COMMUNITY)
Admission: EM | Disposition: A | Discharge status: Home / Self Care | Payer: Self-pay | Source: Home / Self Care | Attending: Internal Medicine

## 2020-01-09 DIAGNOSIS — I200 Unstable angina: Secondary | ICD-10-CM

## 2020-01-09 DIAGNOSIS — I25110 Atherosclerotic heart disease of native coronary artery with unstable angina pectoris: Principal | ICD-10-CM

## 2020-01-09 DIAGNOSIS — I2510 Atherosclerotic heart disease of native coronary artery without angina pectoris: Secondary | ICD-10-CM

## 2020-01-09 HISTORY — PX: LEFT HEART CATH AND CORONARY ANGIOGRAPHY: CATH118249

## 2020-01-09 LAB — CBC
HCT: 34.4 % — ABNORMAL LOW (ref 36.0–46.0)
Hemoglobin: 11.7 g/dL — ABNORMAL LOW (ref 12.0–15.0)
MCH: 31.8 pg (ref 26.0–34.0)
MCHC: 34 g/dL (ref 30.0–36.0)
MCV: 93.5 fL (ref 80.0–100.0)
Platelets: 200 10*3/uL (ref 150–400)
RBC: 3.68 MIL/uL — ABNORMAL LOW (ref 3.87–5.11)
RDW: 12.1 % (ref 11.5–15.5)
WBC: 5.6 10*3/uL (ref 4.0–10.5)
nRBC: 0 % (ref 0.0–0.2)

## 2020-01-09 LAB — BASIC METABOLIC PANEL
Anion gap: 9 (ref 5–15)
BUN: 21 mg/dL (ref 8–23)
CO2: 23 mmol/L (ref 22–32)
Calcium: 8.6 mg/dL — ABNORMAL LOW (ref 8.9–10.3)
Chloride: 106 mmol/L (ref 98–111)
Creatinine, Ser: 0.78 mg/dL (ref 0.44–1.00)
GFR calc Af Amer: >60 mL/min (ref >60)
GFR calc non Af Amer: >60 mL/min (ref >60)
Glucose, Bld: 120 mg/dL — ABNORMAL HIGH (ref 70–99)
Potassium: 3.2 mmol/L — ABNORMAL LOW (ref 3.5–5.1)
Sodium: 138 mmol/L (ref 135–145)

## 2020-01-09 SURGERY — LEFT HEART CATH AND CORONARY ANGIOGRAPHY
Anesthesia: LOCAL

## 2020-01-09 MED ORDER — VERAPAMIL HCL 2.5 MG/ML IV SOLN
INTRAVENOUS | Status: AC
  Filled 2020-01-09: qty 2

## 2020-01-09 MED ORDER — LABETALOL HCL 5 MG/ML IV SOLN: 10.0000 mg | INTRAVENOUS | Status: AC | PRN

## 2020-01-09 MED ORDER — CARVEDILOL 3.125 MG PO TABS: 3.1250 mg | ORAL_TABLET | Freq: Two times a day (BID) | ORAL | 0 refills | Status: DC

## 2020-01-09 MED ORDER — LIDOCAINE HCL (PF) 1 % IJ SOLN
INTRAMUSCULAR | Status: DC | PRN
  Administered 2020-01-09: 2 mL

## 2020-01-09 MED ORDER — HEPARIN SODIUM (PORCINE) 1000 UNIT/ML IJ SOLN
INTRAMUSCULAR | Status: AC
  Filled 2020-01-09: qty 1

## 2020-01-09 MED ORDER — IOHEXOL 350 MG/ML SOLN
INTRAVENOUS | Status: DC | PRN
  Administered 2020-01-09: 30 mL

## 2020-01-09 MED ORDER — FENTANYL CITRATE (PF) 100 MCG/2ML IJ SOLN
INTRAMUSCULAR | Status: AC
  Filled 2020-01-09: qty 2

## 2020-01-09 MED ORDER — LIDOCAINE HCL (PF) 1 % IJ SOLN
INTRAMUSCULAR | Status: AC
  Filled 2020-01-09: qty 30

## 2020-01-09 MED ORDER — MIDAZOLAM HCL 2 MG/2ML IJ SOLN
INTRAMUSCULAR | Status: AC
  Filled 2020-01-09: qty 2

## 2020-01-09 MED ORDER — ENOXAPARIN SODIUM 40 MG/0.4ML ~~LOC~~ SOLN: 40.0000 mg | SUBCUTANEOUS | Status: DC

## 2020-01-09 MED ORDER — SODIUM CHLORIDE 0.9 % IV SOLN: INTRAVENOUS | Status: AC

## 2020-01-09 MED ORDER — SODIUM CHLORIDE 0.9% FLUSH: 3.0000 mL | INTRAVENOUS | Status: DC | PRN

## 2020-01-09 MED ORDER — SODIUM CHLORIDE 0.9% FLUSH: 3.0000 mL | Freq: Two times a day (BID) | INTRAVENOUS | Status: DC

## 2020-01-09 MED ORDER — HEPARIN (PORCINE) IN NACL 1000-0.9 UT/500ML-% IV SOLN
INTRAVENOUS | Status: AC
  Filled 2020-01-09: qty 1000

## 2020-01-09 MED ORDER — HYDRALAZINE HCL 20 MG/ML IJ SOLN: 10.0000 mg | INTRAMUSCULAR | Status: AC | PRN

## 2020-01-09 MED ORDER — VERAPAMIL HCL 2.5 MG/ML IV SOLN
INTRAVENOUS | Status: DC | PRN
  Administered 2020-01-09: 10 mL via INTRA_ARTERIAL

## 2020-01-09 MED ORDER — HEPARIN (PORCINE) IN NACL 1000-0.9 UT/500ML-% IV SOLN
INTRAVENOUS | Status: DC | PRN
  Administered 2020-01-09 (×2): 500 mL

## 2020-01-09 MED ORDER — FLUTICASONE PROPIONATE 50 MCG/ACT NA SUSP: 2.0000 | Freq: Every day | NASAL | 6 refills | Status: DC

## 2020-01-09 MED ORDER — SODIUM CHLORIDE 0.9 % IV SOLN: 250.0000 mL | INTRAVENOUS | Status: DC | PRN

## 2020-01-09 MED ORDER — MIDAZOLAM HCL 2 MG/2ML IJ SOLN
INTRAMUSCULAR | Status: DC | PRN
  Administered 2020-01-09: 1 mg via INTRAVENOUS

## 2020-01-09 MED ORDER — FENTANYL CITRATE (PF) 100 MCG/2ML IJ SOLN
INTRAMUSCULAR | Status: DC | PRN
  Administered 2020-01-09: 50 ug via INTRAVENOUS

## 2020-01-09 MED ORDER — HEPARIN SODIUM (PORCINE) 1000 UNIT/ML IJ SOLN
INTRAMUSCULAR | Status: DC | PRN
  Administered 2020-01-09: 3500 [IU] via INTRAVENOUS

## 2020-01-09 SURGICAL SUPPLY — 11 items
CATH OPTITORQUE TIG 4.0 5F (CATHETERS) ×1 IMPLANT
DEVICE RAD TR BAND REGULAR (VASCULAR PRODUCTS) ×1 IMPLANT
GLIDESHEATH SLEND A-KIT 6F 22G (SHEATH) ×1 IMPLANT
GUIDEWIRE INQWIRE 1.5J.035X260 (WIRE) IMPLANT
INQWIRE 1.5J .035X260CM (WIRE) ×2
KIT HEART LEFT (KITS) ×2 IMPLANT
PACK CARDIAC CATHETERIZATION (CUSTOM PROCEDURE TRAY) ×2 IMPLANT
SHEATH PROBE COVER 6X72 (BAG) ×1 IMPLANT
SYR MEDRAD MARK 7 150ML (SYRINGE) ×2 IMPLANT
TRANSDUCER W/STOPCOCK (MISCELLANEOUS) ×2 IMPLANT
TUBING CIL FLEX 10 FLL-RA (TUBING) ×2 IMPLANT

## 2020-01-09 NOTE — Discharge Summary (Signed)
Physician Discharge Summary  Stacey Moody [id number] DOB: [birth year] DOA: 01/06/2020  PCP: Susy Frizzle, MD  Admit date: 01/06/2020 Discharge date: 01/09/2020  Admitted From: home Disposition:  home  Recommendations for Outpatient Follow-up:  1. Follow up with PCP in 1-2 weeks 2. Please obtain BMP/CBC in one week 3. Please follow up on the following pending results:  Home Health:no  Equipment/Devices: none  Discharge Condition: Stable Code Status: full Diet recommendation: Heart Healthy  Brief/Interim Summary:  74 year old female with past medical history significant for GERD, hypertension, hyperlipidemia,.  Patient was admitted at [facility] where she presented with  chest pain  with associated nausea, shortness of breath and diaphoresis. Left arm heaviness is also reported. Patient was transferred to [facility] for further work-up.  Serial troponins are negative. Patient was seen by cardiology given her symptoms risk factors age underwent cardiac cath 1. "Mild, non-obstructive coronary artery disease, including 10-20% distal LMCA and 30% proximal RCA stenoses. 2. Low left ventricular filling pressure.  Recommendations: 1. Medical therapy and risk factor modification to prevent progression of coronary artery disease. Post-cath hydration"   Post-cath patient did well.  Cardiology advised discharge her home on aspirin Crestor low-dose Coreg, losartan.  Her blood pressures fairly stable not getting her home amlodipine and advised to hold off for now. Discussed with cardiologist Dr Pearletha Forge to discharge this afternoon with daughter at the bedside.   Assessment/Plan/ Discharge Diagnoses:  Active Problems: Chest pain Hypertension Hyperlipidemia   Risk factor modification was discussed Hemoglobin A1c well controlled at 0.3, LDL less than 70  Consults:  Cardiology  Subjective: Sting comfortably on the bed.  No nausea vomiting chest pain fever  chills. Discharge Exam: Vitals:   01/09/20 1507 01/09/20 1509  BP:  (!) 133/44  Pulse: 60   Resp:    Temp: 98.1 F (36.7 C)   SpO2: 97%    General: Pt is alert, awake, not in acute distress Cardiovascular: RRR, S1/S2 +, no rubs, no gallops Respiratory: CTA bilaterally, no wheezing, no rhonchi Abdominal: Soft, NT, ND, bowel sounds + Extremities: no edema, no cyanosis  Discharge Instructions  Discharge Instructions    Diet - low sodium heart healthy   Complete by: As directed    Discharge instructions   Complete by: As directed    Please call call MD or return to ER for similar or worsening recurring problem that brought you to hospital or if any fever,nausea/vomiting,abdominal pain, uncontrolled pain, chest pain,  shortness of breath or any other alarming symptoms.  Please follow-up your doctor as instructed in a week time and call the office for appointment.  Please avoid alcohol, smoking, or any other illicit substance and maintain healthy habits including taking your regular medications as prescribed.  You were cared for by a hospitalist during your hospital stay. If you have any questions about your discharge medications or the care you received while you were in the hospital after you are discharged, you can call the unit and ask to speak with the hospitalist on call if the hospitalist that took care of you is not available.  Once you are discharged, your primary care physician will handle any further medical issues. Please note that NO REFILLS for any discharge medications will be authorized once you are discharged, as it is imperative that you return to your primary care physician (or establish a relationship with a primary care physician if you do not have one) for your aftercare needs so that they can reassess your  need for medications and monitor your lab values.  Keep BP lower than 130/80 and follow up with pcp to adjust med and monitor bp   Increase activity slowly    Complete by: As directed      Allergies as of 01/09/2020      Reactions   Tetanus Toxoids Swelling      Medication List    STOP taking these medications   amLODipine 5 MG tablet Commonly known as: NORVASC     TAKE these medications   aspirin EC 81 MG tablet Take 81 mg by mouth daily.   carvedilol 3.125 MG tablet Commonly known as: COREG Take 1 tablet (3.125 mg total) by mouth 2 (two) times daily with a meal.   cholecalciferol 1000 units tablet Commonly known as: VITAMIN D Take 1,000 Units by mouth daily.   fluticasone 50 MCG/ACT nasal spray Commonly known as: FLONASE Place 2 sprays into both nostrils daily.   hydrochlorothiazide 25 MG tablet Commonly known as: HYDRODIURIL TAKE 1 TABLET BY MOUTH EVERY DAY   losartan 100 MG tablet Commonly known as: COZAAR TAKE 1 TABLET(100 MG) BY MOUTH DAILY   rosuvastatin 20 MG tablet Commonly known as: CRESTOR TAKE 1 TABLET BY MOUTH EVERY DAY      Follow-up Information    Susy Frizzle, MD Follow up in 1 week(s).   Specialty: Family Medicine Contact information: [address] [place] [address] [phone]        Josue Hector, MD .   Specialty: Cardiology Contact information: [phone] [place] [address] [phone]          Allergies  Allergen Reactions  . Tetanus Toxoids Swelling    The results of significant diagnostics from this hospitalization (including imaging, microbiology, ancillary and laboratory) are listed below for reference.    Microbiology: Recent Results (from the past 240 hour(s))  SARS CORONAVIRUS 2 (TAT 6-24 HRS) Nasopharyngeal Nasopharyngeal Swab     Status: None   Collection Time: 01/06/20  7:56 PM   Specimen: Nasopharyngeal Swab  Result Value Ref Range Status   SARS Coronavirus 2 NEGATIVE NEGATIVE Final    Comment: (NOTE) SARS-CoV-2 target nucleic acids are NOT DETECTED. The SARS-CoV-2 RNA is generally detectable in upper and  lower respiratory specimens during the acute phase of infection. Negative results do not preclude SARS-CoV-2 infection, do not rule out co-infections with other pathogens, and should not be used as the sole basis for treatment or other patient management decisions. Negative results must be combined with clinical observations, patient history, and epidemiological information. The expected result is Negative. Fact Sheet for Patients: SugarRoll.be Fact Sheet for Healthcare Providers: https://www.woods-mathews.com/ This test is not yet approved or cleared by the [place] FDA and  has been authorized for detection and/or diagnosis of SARS-CoV-2 by FDA under an Emergency Use Authorization (EUA). This EUA will remain  in effect (meaning this test can be used) for the duration of the COVID-19 declaration under Section 56 4(b)(1) of the Act, 21 U.S.C. section 360bbb-3(b)(1), unless the authorization is terminated or revoked sooner. Performed at [facility], [place] [address], [place], [place] [address]     Procedures/Studies: CARDIAC CATHETERIZATION  Result Date: 01/09/2020 Conclusions: 1. Mild, non-obstructive coronary artery disease, including 10-20% distal LMCA and 30% proximal RCA stenoses. 2. Low left ventricular filling pressure. Recommendations: 1. Medical therapy and risk factor modification to prevent progression of coronary artery disease. 2. Post-cath hydration. Nelva Bush, MD [facility]   DG Chest Portable 1 View  Result Date: 01/06/2020 CLINICAL DATA:  Chest pain EXAM: PORTABLE CHEST 1 VIEW COMPARISON:  12/31/2013 FINDINGS: Cardiac shadows within normal limits. The lungs are well aerated bilaterally. No focal infiltrate is seen. Mild aortic calcifications are noted. Bony structures are unremarkable. IMPRESSION: No active disease. Electronically Signed   By: Inez Catalina M.D.   On: 01/06/2020 20:25   ECHOCARDIOGRAM  COMPLETE  Result Date: 01/07/2020   ECHOCARDIOGRAM REPORT   Patient Name:   DEBORRA COSSEY Date of Exam: 01/07/2020 Medical Rec #:  [id number]    Height:       63.0 in Accession #:    [id number]   Weight:       153.0 lb Date of Birth:  Jul 01, 1946     BSA:          1.73 m Patient Age:    88 years     BP:           140/61 mmHg Patient Gender: F            HR:           57 bpm. Exam Location:  [facility] Procedure: 2D Echo, Cardiac Doppler and Color Doppler Indications:    Chest pain  History:        Patient has prior history of Echocardiogram examinations, most                 recent 01/02/2014. Signs/Symptoms:Chest Pain; Risk                 Factors:Hypertension and Dyslipidemia.  Sonographer:    Dustin Flock RDCS Referring Phys: [place]  1. Left ventricular ejection fraction, by visual estimation, is 60 to 65%. The left ventricle has normal function. There is mildly increased left ventricular hypertrophy.  2. The left ventricle has no regional wall motion abnormalities.  3. Global right ventricle has normal systolic function.The right ventricular size is normal. No increase in right ventricular wall thickness.  4. Left atrial size was normal.  5. Right atrial size was normal.  6. The mitral valve is normal in structure. No evidence of mitral valve regurgitation. No evidence of mitral stenosis.  7. The tricuspid valve is normal in structure.  8. The aortic valve is normal in structure. Aortic valve regurgitation is not visualized. No evidence of aortic valve sclerosis or stenosis.  9. The pulmonic valve was normal in structure. Pulmonic valve regurgitation is not visualized. 10. TR signal is inadequate for assessing pulmonary artery systolic pressure. 11. The inferior vena cava is normal in size with greater than 50% respiratory variability, suggesting right atrial pressure of 3 mmHg. FINDINGS  Left Ventricle: Left ventricular ejection fraction, by visual estimation, is 60 to 65%. The left  ventricle has normal function. The left ventricle has no regional wall motion abnormalities. The left ventricular internal cavity size was the left ventricle is normal in size. There is mildly increased left ventricular hypertrophy. Concentric left ventricular hypertrophy. Indeterminate filling pressures. Right Ventricle: The right ventricular size is normal. No increase in right ventricular wall thickness. Global RV systolic function is has normal systolic function. Left Atrium: Left atrial size was normal in size. Right Atrium: Right atrial size was normal in size Pericardium: There is no evidence of pericardial effusion. Mitral Valve: The mitral valve is normal in structure. No evidence of mitral valve regurgitation. No evidence of mitral valve stenosis by observation. Tricuspid Valve: The tricuspid valve  is normal in structure. Tricuspid valve regurgitation is not demonstrated. Aortic Valve: The aortic valve is normal in structure. Aortic valve regurgitation is not visualized. The aortic valve is structurally normal, with no evidence of sclerosis or stenosis. Pulmonic Valve: The pulmonic valve was normal in structure. Pulmonic valve regurgitation is not visualized. Pulmonic regurgitation is not visualized. Aorta: The aortic root, ascending aorta and aortic arch are all structurally normal, with no evidence of dilitation or obstruction. Venous: The inferior vena cava is normal in size with greater than 50% respiratory variability, suggesting right atrial pressure of 3 mmHg. IAS/Shunts: No atrial level shunt detected by color flow Doppler. There is no evidence of a patent foramen ovale. No ventricular septal defect is seen or detected. There is no evidence of an atrial septal defect.  LEFT VENTRICLE PLAX 2D LVIDd:         3.99 cm  Diastology LVIDs:         2.07 cm  LV e' lateral:   8.16 cm/s LV PW:         1.17 cm  LV E/e' lateral: 10.0 LV IVS:        1.25 cm  LV e' medial:    4.90 cm/s LVOT diam:     1.90 cm  LV  E/e' medial:  16.7 LV SV:         56 ml LV SV Index:   31.39 LVOT Area:     2.84 cm  RIGHT VENTRICLE RV Basal diam:  2.86 cm RV S prime:     11.40 cm/s TAPSE (M-mode): 2.8 cm LEFT ATRIUM           Index       RIGHT ATRIUM           Index LA diam:      2.80 cm 1.62 cm/m  RA Area:     17.70 cm LA Vol (A4C): 36.2 ml 20.98 ml/m RA Volume:   51.50 ml  29.85 ml/m  AORTIC VALVE LVOT Vmax:   109.00 cm/s LVOT Vmean:  83.600 cm/s LVOT VTI:    0.276 m  AORTA Ao Root diam: 2.60 cm MITRAL VALVE MV Area (PHT): 2.87 cm              SHUNTS MV PHT:        76.56 msec            Systemic VTI:  0.28 m MV Decel Time: 264 msec              Systemic Diam: 1.90 cm MV E velocity: 82.00 cm/s  103 cm/s MV A velocity: 101.00 cm/s 70.3 cm/s MV E/A ratio:  0.81        1.5  Mihai Croitoru MD Electronically signed by Sanda Klein MD Signature Date/Time: 01/07/2020/3:59:54 PM    Final     Labs: BNP (last 3 results) No results for input(s): BNP in the last 8760 hours. Basic Metabolic Panel: Recent Labs  Lab 01/06/20 2023 01/07/20 0706 01/09/20 0944  NA 138 139 138  K 3.6 3.5 3.2*  CL 106 104 106  CO2 23 25 23   GLUCOSE 190* 122* 120*  BUN 14 12 21   CREATININE 0.70 0.65 0.78  CALCIUM 9.3 9.5 8.6*   Liver Function Tests: Recent Labs  Lab 01/07/20 0706  AST 21  ALT 19  ALKPHOS 59  BILITOT 0.7  PROT 7.0  ALBUMIN 4.2   No results for input(s): LIPASE, AMYLASE in the last 168 hours. No  results for input(s): AMMONIA in the last 168 hours. CBC: Recent Labs  Lab 01/06/20 2023 01/07/20 0706 01/09/20 0944  WBC 8.4 7.5 5.6  HGB 12.5 12.4 11.7*  HCT 37.6 37.5 34.4*  MCV 94.9 94.5 93.5  PLT 239 236 200   Cardiac Enzymes: No results for input(s): CKTOTAL, CKMB, CKMBINDEX, TROPONINI in the last 168 hours. BNP: Invalid input(s): POCBNP CBG: Recent Labs  Lab 01/06/20 2030  GLUCAP 178*   D-Dimer No results for input(s): DDIMER in the last 72 hours. Hgb A1c Recent Labs    01/07/20 0706  HGBA1C 6.3*    Lipid Profile Recent Labs    01/07/20 0706  CHOL 131  HDL 64  LDLCALC 54  TRIG 63  CHOLHDL 2.0   Thyroid function studies No results for input(s): TSH, T4TOTAL, T3FREE, THYROIDAB in the last 72 hours.  Invalid input(s): FREET3 Anemia work up No results for input(s): VITAMINB12, FOLATE, FERRITIN, TIBC, IRON, RETICCTPCT in the last 72 hours. Urinalysis    Component Value Date/Time   COLORURINE STRAW (A) 12/01/2017 2200   APPEARANCEUR CLEAR 12/01/2017 2200   LABSPEC 1.004 (L) 12/01/2017 2200   PHURINE 6.0 12/01/2017 2200   GLUCOSEU NEGATIVE 12/01/2017 2200   HGBUR NEGATIVE 12/01/2017 2200   BILIRUBINUR NEGATIVE 12/01/2017 2200   KETONESUR NEGATIVE 12/01/2017 2200   PROTEINUR NEGATIVE 12/01/2017 2200   UROBILINOGEN 1.0 12/29/2014 1217   NITRITE NEGATIVE 12/01/2017 2200   LEUKOCYTESUR TRACE (A) 12/01/2017 2200   Sepsis Labs Invalid input(s): PROCALCITONIN,  WBC,  LACTICIDVEN Microbiology Recent Results (from the past 240 hour(s))  SARS CORONAVIRUS 2 (TAT 6-24 HRS) Nasopharyngeal Nasopharyngeal Swab     Status: None   Collection Time: 01/06/20  7:56 PM   Specimen: Nasopharyngeal Swab  Result Value Ref Range Status   SARS Coronavirus 2 NEGATIVE NEGATIVE Final    Comment: (NOTE) SARS-CoV-2 target nucleic acids are NOT DETECTED. The SARS-CoV-2 RNA is generally detectable in upper and lower respiratory specimens during the acute phase of infection. Negative results do not preclude SARS-CoV-2 infection, do not rule out co-infections with other pathogens, and should not be used as the sole basis for treatment or other patient management decisions. Negative results must be combined with clinical observations, patient history, and epidemiological information. The expected result is Negative. Fact Sheet for Patients: SugarRoll.be Fact Sheet for Healthcare Providers: https://www.woods-mathews.com/ This test is not yet approved or  cleared by the [place] FDA and  has been authorized for detection and/or diagnosis of SARS-CoV-2 by FDA under an Emergency Use Authorization (EUA). This EUA will remain  in effect (meaning this test can be used) for the duration of the COVID-19 declaration under Section 56 4(b)(1) of the Act, 21 U.S.C. section 360bbb-3(b)(1), unless the authorization is terminated or revoked sooner. Performed at [facility], [place] [address], [place], [place] [address]      Time coordinating discharge: 25 minutes  SIGNED: Antonieta Pert, MD  [facility] 01/09/2020, 3:21 PM  If 7PM-7AM, please contact night-coverage [email or web address]

## 2020-01-09 NOTE — Interval H&P Note (Signed)
History and Physical Interval Note:  01/09/2020 8:56 AM  Stacey Moody  has presented today for cardiac catheterization, with the diagnosis of unstable angina.  The various methods of treatment have been discussed with the patient and family. After consideration of risks, benefits and other options for treatment, the patient has consented to  Procedure(s): LEFT HEART CATH AND CORONARY ANGIOGRAPHY (N/A) as a surgical intervention.  The patient's history has been reviewed, patient examined, no change in status, stable for surgery.  I have reviewed the patient's chart and labs.  Questions were answered to the patient's satisfaction.    Cath Lab Visit (complete for each Cath Lab visit)  Clinical Evaluation Leading to the Procedure:   ACS: Yes.    Non-ACS:  N/A  Sammi Stolarz

## 2020-01-09 NOTE — Progress Notes (Addendum)
The patient has been seen in conjunction with Stacey Newton, NP. All aspects of care have been considered and discussed. The patient has been personally interviewed, examined, and all clinical data has been reviewed.   Asymptomatic this morning.  Coronary digital images and ventriculogram reviewed.  She has diffuse nonobstructive first diagonal and distal LAD disease.  There is also diffuse nonobstructive involvement of the mid RCA.  No wall motion abnormality is noted.  The patient has nonobstructive CAD and deserves aggressive preventive therapy: LDL less than 70, blood pressure less than 130/80 mmHg, A1c less than 7, greater than 150 minutes of moderate activity per week, smoking cessation if appropriate, consideration of sleep apnea if appropriate,  Agree with current medical regimen which includes high intensity statin therapy and an ARB.  The patient is ready for discharge from cardiac standpoint.  We will sign off.  Stacey Moody will sign off.   Medication Recommendations:  See above Other recommendations (labs, testing, etc):  Noted above Follow up as an outpatient:  PCP and Stacey Moody if needed.    Progress Note  Patient Name: Stacey Moody Date of Encounter: 01/09/2020  Primary Cardiologist: Stacey Rouge, MD   Subjective   No further chest pain overnight.  No shortness of breath.  Patient leaving for cardiac cath.  Inpatient Medications    Scheduled Meds: . aspirin EC  81 mg Oral Daily  . carvedilol  3.125 mg Oral BID WC  . enoxaparin (LOVENOX) injection  40 mg Subcutaneous Q24H  . hydrochlorothiazide  25 mg Oral Daily  . losartan  100 mg Oral Daily  . rosuvastatin  20 mg Oral Daily  . sodium chloride flush  3 mL Intravenous Q12H  . cholecalciferol  1,000 Units Oral Daily   Continuous Infusions: . sodium chloride    . sodium chloride 1 mL/kg/hr (01/09/20 0600)   PRN Meds: sodium chloride, acetaminophen **OR** acetaminophen, fluticasone, nitroGLYCERIN,  sodium chloride flush   Vital Signs    Vitals:   01/08/20 1620 01/08/20 1952 01/09/20 0417 01/09/20 0443  BP:  (!) 122/54 (!) 121/44   Pulse: 64 (!) 57    Resp:  18 18   Temp:  97.6 F (36.4 C) 98 F (36.7 C)   TempSrc:  Oral Oral   SpO2:  97% 97%   Weight:    68.2 kg  Height:        Intake/Output Summary (Last 24 hours) at 01/09/2020 0843 Last data filed at 01/09/2020 0600 Gross per 24 hour  Intake 874.4 ml  Output --  Net 874.4 ml   Last 3 Weights 01/09/2020 01/08/2020 01/07/2020  Weight (lbs) 150 lb 4.8 oz 151 lb 3.8 oz 151 lb 4.8 oz  Weight (kg) 68.176 kg 68.6 kg 68.629 kg      Telemetry    HR 50's - Personally Reviewed  ECG    No new tracings for review  Physical Exam   GEN: No acute distress.   Neck: No JVD, right carotid bruit Cardiac: RRR, no murmurs, rubs, or gallops.  Respiratory: Clear to auscultation bilaterally. GI: Soft, nontender, non-distended  MS: No edema; No deformity. Neuro:  Nonfocal  Psych: Normal affect   Labs    High Sensitivity Troponin:   Recent Labs  Lab 01/06/20 2023 01/06/20 2202  TROPONINIHS 3 3      Chemistry Recent Labs  Lab 01/06/20 2023 01/07/20 0706  Moody 138 139  K 3.6 3.5  CL 106 104  CO2 23 25  GLUCOSE  190* 122*  BUN 14 12  CREATININE 0.70 0.65  CALCIUM 9.3 9.5  PROT  --  7.0  ALBUMIN  --  4.2  AST  --  21  ALT  --  19  ALKPHOS  --  59  BILITOT  --  0.7  GFRNONAA >60 >60  GFRAA >60 >60  ANIONGAP 9 10     Hematology Recent Labs  Lab 01/06/20 2023 01/07/20 0706  WBC 8.4 7.5  RBC 3.96 3.97  HGB 12.5 12.4  HCT 37.6 37.5  MCV 94.9 94.5  MCH 31.6 31.2  MCHC 33.2 33.1  RDW 12.1 12.1  PLT 239 236    BNPNo results for input(s): BNP, PROBNP in the last 168 hours.   DDimer No results for input(s): DDIMER in the last 168 hours.   Radiology    ECHOCARDIOGRAM COMPLETE  Result Date: 01/07/2020   ECHOCARDIOGRAM REPORT   Patient Name:   Stacey Moody Date of Exam: 01/07/2020 Medical Rec #:   [id number]    Height:       63.0 in Accession #:    [id number]   Weight:       153.0 lb Date of Birth:  05-01-1946     BSA:          1.73 m Patient Age:    74 years     BP:           140/61 mmHg Patient Gender: F            HR:           57 bpm. Exam Location:  [facility] Procedure: 2D Echo, Cardiac Doppler and Color Doppler Indications:    Chest pain  History:        Patient has prior history of Echocardiogram examinations, most                 recent 01/02/2014. Signs/Symptoms:Chest Pain; Risk                 Factors:Hypertension and Dyslipidemia.  Sonographer:    Stacey Moody RDCS Referring Phys: Stacey Moody  1. Left ventricular ejection fraction, by visual estimation, is 60 to 65%. The left ventricle has normal function. There is mildly increased left ventricular hypertrophy.  2. The left ventricle has no regional wall motion abnormalities.  3. Global right ventricle has normal systolic function.The right ventricular size is normal. No increase in right ventricular wall thickness.  4. Left atrial size was normal.  5. Right atrial size was normal.  6. The mitral valve is normal in structure. No evidence of mitral valve regurgitation. No evidence of mitral stenosis.  7. The tricuspid valve is normal in structure.  8. The aortic valve is normal in structure. Aortic valve regurgitation is not visualized. No evidence of aortic valve sclerosis or stenosis.  9. The pulmonic valve was normal in structure. Pulmonic valve regurgitation is not visualized. 10. TR signal is inadequate for assessing pulmonary artery systolic pressure. 11. The inferior vena cava is normal in size with greater than 50% respiratory variability, suggesting right atrial pressure of 3 mmHg. FINDINGS  Left Ventricle: Left ventricular ejection fraction, by visual estimation, is 60 to 65%. The left ventricle has normal function. The left ventricle has no regional wall motion abnormalities. The left ventricular internal cavity size was  the left ventricle is normal in size. There is mildly increased left ventricular hypertrophy. Concentric left ventricular hypertrophy. Indeterminate filling pressures. Right Ventricle: The right ventricular  size is normal. No increase in right ventricular wall thickness. Global RV systolic function is has normal systolic function. Left Atrium: Left atrial size was normal in size. Right Atrium: Right atrial size was normal in size Pericardium: There is no evidence of pericardial effusion. Mitral Valve: The mitral valve is normal in structure. No evidence of mitral valve regurgitation. No evidence of mitral valve stenosis by observation. Tricuspid Valve: The tricuspid valve is normal in structure. Tricuspid valve regurgitation is not demonstrated. Aortic Valve: The aortic valve is normal in structure. Aortic valve regurgitation is not visualized. The aortic valve is structurally normal, with no evidence of sclerosis or stenosis. Pulmonic Valve: The pulmonic valve was normal in structure. Pulmonic valve regurgitation is not visualized. Pulmonic regurgitation is not visualized. Aorta: The aortic root, ascending aorta and aortic arch are all structurally normal, with no evidence of dilitation or obstruction. Venous: The inferior vena cava is normal in size with greater than 50% respiratory variability, suggesting right atrial pressure of 3 mmHg. IAS/Shunts: No atrial level shunt detected by color flow Doppler. There is no evidence of a patent foramen ovale. No ventricular septal defect is seen or detected. There is no evidence of an atrial septal defect.  LEFT VENTRICLE PLAX 2D LVIDd:         3.99 cm  Diastology LVIDs:         2.07 cm  LV e' lateral:   8.16 cm/s LV PW:         1.17 cm  LV E/e' lateral: 10.0 LV IVS:        1.25 cm  LV e' medial:    4.90 cm/s LVOT diam:     1.90 cm  LV E/e' medial:  16.7 LV SV:         56 ml LV SV Index:   31.39 LVOT Area:     2.84 cm  RIGHT VENTRICLE RV Basal diam:  2.86 cm RV S prime:      11.40 cm/s TAPSE (M-mode): 2.8 cm LEFT ATRIUM           Index       RIGHT ATRIUM           Index LA diam:      2.80 cm 1.62 cm/m  RA Area:     17.70 cm LA Vol (A4C): 36.2 ml 20.98 ml/m RA Volume:   51.50 ml  29.85 ml/m  AORTIC VALVE LVOT Vmax:   109.00 cm/s LVOT Vmean:  83.600 cm/s LVOT VTI:    0.276 m  AORTA Ao Root diam: 2.60 cm MITRAL VALVE MV Area (PHT): 2.87 cm              SHUNTS MV PHT:        76.56 msec            Systemic VTI:  0.28 m MV Decel Time: 264 msec              Systemic Diam: 1.90 cm MV E velocity: 82.00 cm/s  103 cm/s MV A velocity: 101.00 cm/s 70.3 cm/s MV E/A ratio:  0.81        1.5  Mihai Croitoru MD Electronically signed by Sanda Klein MD Signature Date/Time: 01/07/2020/3:59:54 PM    Final     Cardiac Studies   Echocardiogram: 01/07/2020 IMPRESSIONS   1. Left ventricular ejection fraction, by visual estimation, is 60 to 65%. The left ventricle has normal function. There is mildly increased left ventricular hypertrophy. 2. The left ventricle  has no regional wall motion abnormalities. 3. Global right ventricle has normal systolic function.The right ventricular size is normal. No increase in right ventricular wall thickness. 4. Left atrial size was normal. 5. Right atrial size was normal. 6. The mitral valve is normal in structure. No evidence of mitral valve regurgitation. No evidence of mitral stenosis. 7. The tricuspid valve is normal in structure. 8. The aortic valve is normal in structure. Aortic valve regurgitation is not visualized. No evidence of aortic valve sclerosis or stenosis. 9. The pulmonic valve was normal in structure. Pulmonic valve regurgitation is not visualized. 10. TR signal is inadequate for assessing pulmonary artery systolic pressure. 11. The inferior vena cava is normal in size with greater than 50% respiratory variability, suggesting right atrial pressure of 3 mmHg.  Carotid Dopplers: 07/2018 Final Interpretation: Right  Carotid: Velocities in the right ICA are consistent with a 1-39% stenosis. The ECA appears >50% stenosed.  Left Carotid: Velocities in the left ICA are consistent with a 40-59% stenosis.  Vertebrals: Bilateral vertebral arteries demonstrate antegrade flow. Subclavians: Normal flow hemodynamics were seen in bilateral subclavian arteries.  Patient Profile     74 y.o. female with past medical history of HTN, HLD, carotid artery stenosis and possible renal artery stenosis (by prior imaging in 11/2017) who is being seen today for the evaluation of chest pain.  Assessment & Plan    Chest pain concerning for angina -No further chest pain overnight -High-sensitivity troponins remained low and flat, at 3. -EKG nonischemic. -CVD risk factors including PVD, hypertension, hyperlipidemia, family history of CAD and prior tobacco use. -Patient is for cardiac catheterization to definitively evaluate coronary arteries, possible PCI if indicated. -Beta-blocker initiated.  Continued on statin and aspirin.  -Heart rate in the 50s overnight.  Watch heart rate during activity today to see how she tolerates the addition of beta-blocker.  Hypertension -Blood pressure was initially elevated but improved. -Amlodipine was discontinued in favor of initiating beta-blocker. -Blood pressure is currently well controlled.  Hyperlipidemia -On rosuvastatin 20 mg daily. Total cholesterol of 131, triglycerides 63, HDL 64 and LDL 54. Continue Crestor.  Carotid artery stenosis -Followed annually by vascular surgery as an outpatient. -Continues on aspirin and statin.     For questions or updates, please contact [place] Please consult [email or web address] for contact info under        Signed, Daune Perch, NP  01/09/2020, 8:43 AM

## 2020-01-12 ENCOUNTER — Ambulatory Visit (INDEPENDENT_AMBULATORY_CARE_PROVIDER_SITE_OTHER): Payer: Medicare Other

## 2020-01-12 ENCOUNTER — Telehealth: Payer: Self-pay | Admitting: Family Medicine

## 2020-01-12 ENCOUNTER — Ambulatory Visit
Admission: EM | Admit: 2020-01-12 | Discharge: 2020-01-12 | Disposition: A | Discharge status: Home / Self Care | Payer: Medicare Other | Attending: Emergency Medicine | Admitting: Emergency Medicine

## 2020-01-12 ENCOUNTER — Other Ambulatory Visit: Payer: Self-pay

## 2020-01-12 DIAGNOSIS — Z9889 Other specified postprocedural states: Secondary | ICD-10-CM | POA: Diagnosis not present

## 2020-01-12 DIAGNOSIS — M549 Dorsalgia, unspecified: Secondary | ICD-10-CM | POA: Diagnosis not present

## 2020-01-12 DIAGNOSIS — M546 Pain in thoracic spine: Secondary | ICD-10-CM

## 2020-01-12 DIAGNOSIS — R079 Chest pain, unspecified: Secondary | ICD-10-CM

## 2020-01-12 NOTE — Telephone Encounter (Signed)
Called and spoke with patient to follow up from her recent hospitalization. Patient was discharged on 01/09/2020 from [facility]. Patient states that she has had back pain since her recent heart cath and she went to urgent care today. Urgent care ordered EKG and chest xray that was all normal and she was advised to go home and take tylenol. Patient is ok with this plan. Advised her that we need to schedule her for an hospital follow up. Patient verbalized understanding and was scheduled for an follow up on Monday 01/16/2020

## 2020-01-12 NOTE — ED Triage Notes (Signed)
Pt presents to UC w/ c/o some central chest pain/pressure x3 days, since being d/c from hospital on 1/18. Pt also reports mid upper back pain x3 days.

## 2020-01-12 NOTE — ED Provider Notes (Signed)
[facility]    CSN: [id number] Arrival date & time: 01/12/20  [id number]      History   Chief Complaint Chief Complaint  Patient presents with  . chest/back pain    HPI Stacey Moody is a 74 y.o. female.   Stacey Moody 74 years old female presented to the urgent care with a complaint of gradual  back pain that get worse  the past 2 days.  Denies specific event.  Reports she has not used any medication.  Patient was reportedly admitted to the hospital for chest pain on 01/06/2020.  Cath lab procedure was completed on 01/09/2020 and she was advised to follow-up Dr. Johnsie Cancel cardiologist.  Currently denying chest pain, chest tightness, nausea, vomiting, diarrhea, and confusion.     Past Medical History:  Diagnosis Date  . Carotid artery stenosis   . GERD (gastroesophageal reflux disease)   . Hyperlipidemia   . Hypertension   . Influenza A 01/01/2014  . Osteopenia    T-2.3  . Right renal artery stenosis ([place])   . Syncope 12/31/2013   Vasovagal or hypovolemia/orthostatic    Patient Active Problem List   Diagnosis Date Noted  . Unstable angina ([place])   . Chest pain 01/07/2020  . Right renal artery stenosis ([place])   . Osteopenia   . Abdominal pain   . Legionella infection ([place])   . Gastroenteritis 12/31/2014  . Elevated transaminase level 12/31/2014  . CAP (community acquired pneumonia) 12/31/2014  . Nausea vomiting and diarrhea 12/30/2014  . Hypokalemia 12/30/2014  . Hyponatremia 12/30/2014  . Dehydration 12/30/2014  . Fever 12/30/2014  . Other pancytopenia ([place]) 01/03/2014  . Carotid artery stenosis 01/03/2014  . Vertigo 01/02/2014  . Right otitis media 01/02/2014  . Leukopenia 01/02/2014  . Influenza A 01/01/2014  . Scalp hematoma 01/01/2014  . Syncope 12/31/2013  . Diarrhea 12/31/2013  . URI (upper respiratory infection) 12/31/2013  . Hypertension   . Hyperlipidemia   . GERD (gastroesophageal reflux disease)   . Occlusion and stenosis of carotid artery  without mention of cerebral infarction 06/22/2012    Past Surgical History:  Procedure Laterality Date  . ABDOMINAL HYSTERECTOMY  1982   Partial hysterectomy  . COLONOSCOPY N/A 07/30/2016   Procedure: COLONOSCOPY;  Surgeon: Daneil Dolin, MD;  Location: [facility];  Service: Endoscopy;  Laterality: N/A;  8:30 AM  . CT SCAN  July 2015   Chest  . LEFT HEART CATH AND CORONARY ANGIOGRAPHY N/A 01/09/2020   Procedure: LEFT HEART CATH AND CORONARY ANGIOGRAPHY;  Surgeon: Nelva Bush, MD;  Location: [facility];  Service: Cardiovascular;  Laterality: N/A;  . POLYPECTOMY  07/30/2016   Procedure: POLYPECTOMY;  Surgeon: Daneil Dolin, MD;  Location: [facility];  Service: Endoscopy;;  descending colon  . SMALL INTESTINE SURGERY      OB History   No obstetric history on file.      Home Medications    Prior to Admission medications   Medication Sig Start Date End Date Taking? Authorizing Provider  aspirin EC 81 MG tablet Take 81 mg by mouth daily.    [provider]  carvedilol (COREG) 3.125 MG tablet Take 1 tablet (3.125 mg total) by mouth 2 (two) times daily with a meal. 01/09/20 02/08/20  Antonieta Pert, MD  cholecalciferol (VITAMIN D) 1000 UNITS tablet Take 1,000 Units by mouth daily.    [provider]  fluticasone (FLONASE) 50 MCG/ACT nasal spray Place 2 sprays into both nostrils daily.  01/09/20   Antonieta Pert, MD  hydrochlorothiazide (HYDRODIURIL) 25 MG tablet TAKE 1 TABLET BY MOUTH EVERY DAY 09/19/19   Susy Frizzle, MD  losartan (COZAAR) 100 MG tablet TAKE 1 TABLET(100 MG) BY MOUTH DAILY 09/19/19   Susy Frizzle, MD  rosuvastatin (CRESTOR) 20 MG tablet TAKE 1 TABLET BY MOUTH EVERY DAY 09/19/19   Susy Frizzle, MD    Family History Family History  Problem Relation Age of Onset  . Cancer Mother        pancratic  . Heart attack Mother   . Cancer Father        stomach  . Diabetes Father   . Hyperlipidemia Father   . Heart disease Father         NOT  before age 32  . Cancer Sister        lung  . Hyperlipidemia Sister   . Cancer Brother        thyroid, prostate  . Hyperlipidemia Brother   . Heart attack Brother   . Cancer Sister        colon  . Hyperlipidemia Sister   . Cancer Sister        colon  . Hyperlipidemia Sister     Social History Social History   Tobacco Use  . Smoking status: Former Smoker    Quit date: 12/22/1996    Years since quitting: 23.0  . Smokeless tobacco: Never Used  Substance Use Topics  . Alcohol use: No  . Drug use: No     Allergies   Tetanus toxoids   Review of Systems Review of Systems  Constitutional: Negative.   Respiratory: Negative.   Cardiovascular: Negative.   Gastrointestinal: Negative.   Musculoskeletal: Positive for back pain.  Neurological: Negative.      Physical Exam Triage Vital Signs ED Triage Vitals  Enc Vitals Group     BP 01/12/20 0837 (!) 163/73     Pulse Rate 01/12/20 0837 60     Resp 01/12/20 0837 16     Temp 01/12/20 0837 97.9 F (36.6 C)     Temp Source 01/12/20 0837 Oral     SpO2 01/12/20 0837 96 %     Weight --      Height --      Head Circumference --      Peak Flow --      Pain Score 01/12/20 0851 4     Pain Loc --      Pain Edu? --      Excl. in [place]? --    No data found.  Updated Vital Signs BP (!) 163/73 (BP Location: Right Arm)   Pulse 60   Temp 97.9 F (36.6 C) (Oral)   Resp 16   SpO2 96%   Visual Acuity Right Eye Distance:   Left Eye Distance:   Bilateral Distance:    Right Eye Near:   Left Eye Near:    Bilateral Near:     Physical Exam Vitals and nursing note reviewed.  Constitutional:      General: She is not in acute distress.    Appearance: Normal appearance. She is normal weight. She is not ill-appearing or toxic-appearing.  Cardiovascular:     Rate and Rhythm: Normal rate and regular rhythm.     Pulses: Normal pulses.     Heart sounds: Normal heart sounds. No murmur. No gallop.   Pulmonary:     Effort:  Pulmonary effort is normal. No respiratory distress.  Breath sounds: Normal breath sounds. No wheezing or rales.  Chest:     Chest wall: No tenderness.  Musculoskeletal:        General: No swelling, tenderness, deformity or signs of injury. Normal range of motion.     Cervical back: Normal.     Thoracic back: Normal. No swelling, edema, deformity, signs of trauma, lacerations, spasms, tenderness or bony tenderness. Normal range of motion.     Lumbar back: Normal.     Right lower leg: No edema.     Left lower leg: No edema.     Comments: Back:  Patient ambulates from chair to exam table without difficulty.  Inspection: Skin clear and intact without obvious swelling, erythema, or ecchymosis. Warm to the touch  Palpation:  nontender.  ROM: FROM Strength: 5/5 hip flexion, 5/5 knee extension, 5/5 knee flexion, 5/5 plantar flexion, 5/5 dorsiflexion   Neurological:     General: No focal deficit present.     Mental Status: She is alert and oriented to person, place, and time. Mental status is at baseline.     Cranial Nerves: No cranial nerve deficit.     Sensory: No sensory deficit.     Motor: No weakness.     Coordination: Coordination normal.     Gait: Gait normal.     Deep Tendon Reflexes: Reflexes normal.      UC Treatments / Results  Labs (all labs ordered are listed, but only abnormal results are displayed) Labs Reviewed - No data to display  EKG   Radiology DG Chest 2 View  Result Date: 01/12/2020 CLINICAL DATA:  Chest and back pain for 2 days. EXAM: CHEST - 2 VIEW COMPARISON:  01/06/2020 FINDINGS: The heart size and mediastinal contours are within normal limits. Aortic atherosclerosis incidentally noted. Stable pulmonary hyperinflation suspicious for COPD. Both lungs are clear. The visualized skeletal structures are unremarkable. IMPRESSION: Suspect COPD.  No active cardiopulmonary disease. Electronically Signed   By: Marlaine Hind M.D.   On: 01/12/2020 08:59     Procedures Procedures (including critical care time)  Medications Ordered in UC Medications - No data to display  Initial Impression / Assessment and Plan / UC Course  I have reviewed the triage vital signs and the nursing notes.  Pertinent labs & imaging results that were available during my care of the patient were reviewed by me and considered in my medical decision making (see chart for details).     EKG showed sinus bradycardia Chest x-ray was negative for cardiopulmonary disease, suspected COPD Patient back pain more likely from cardiac catheterization Take Tylenol for pain To go to ED for worsening of symptoms   Final Clinical Impressions(s) / UC Diagnoses   Final diagnoses:  History of cardiac cath  Acute bilateral thoracic back pain     Discharge Instructions     EKGs show sinus bradycardia  Chest x-ray was negative for cardiopulmonary disease, suspected COPD Follow-up with Dr. Susy Frizzle, MD on 01/16/2020 Back pain symptom more likely from cardiac catheterization May take Tylenol for back pain Advised patient to go to ED for worsening of symptoms    ED Prescriptions    None     PDMP not reviewed this encounter.   Emerson Monte, [place] 01/12/20 1249

## 2020-01-12 NOTE — Discharge Instructions (Addendum)
EKGs show sinus bradycardia  Chest x-ray was negative for cardiopulmonary disease, suspected COPD Follow-up with Dr. Dennard Schaumann, Cammie Mcgee, MD on 01/16/2020 Back pain symptom more likely from cardiac catheterization May take Tylenol for back pain Advised patient to go to ED for worsening of symptoms

## 2020-01-14 ENCOUNTER — Telehealth: Payer: Self-pay | Admitting: Internal Medicine

## 2020-01-14 NOTE — Telephone Encounter (Signed)
Cardiology Moonlighter Note  Returned page from patient. Patient recently had cath for chest pain, no intervention. Was discharged on carvedilol (new med), losartan (old med). Amlodipine discontinued as her BP's were controlled off amlodipine in hospital.   Patient checked BP earlier today. Systolic pressure in the 0000000. Felt like she had "head pressure" at that time. No chest pain/pressure, SOB. Has been taking all meds as prescribed (still off amlodipine). No recent dietary changes. Not having any acute pain. Feels very anxious about her BP. She took a dose of her amlodipine 10mg  and rechecked BP about 20 minutes later, was systolic in [id number]. Has taken both doses of carvedilol today already. Also took losartan today already. Last dose of carvedilol was about 30 minutes ago. She has checked her BP with 2 different machines, both of which correlate. Cuff is on upper arm.   I recommended that patient give her BP meds an hour or two to work. Recommended not continuing to check BP every few minutes as this is surely contributing to anxiety. Patient will recheck BP in 1 to 2 hours. Currently she is asymptomatic. If her SBP is >180 then she will call me back. If she develops any new symptoms (CP, SOB, HA, visual changes, etc) she will come to ED immediately. If her BP improves, I recommended continuing to take her home medications as prescribed but also adding amlodipine 5mg  daily starting tomorrow morning.   Patient will come to ED with any new symptoms. She will call back with any questions or concerns. Daughter is with patient at home helping her tonight. A copy of this note will be sent to patient's cardiologist.   Marcie Mowers, MD Cardiology Fellow, PGY-7

## 2020-01-15 NOTE — Telephone Encounter (Signed)
F/u with Dr Dennard Schaumann for her BP

## 2020-01-16 ENCOUNTER — Other Ambulatory Visit: Payer: Self-pay

## 2020-01-16 ENCOUNTER — Encounter: Payer: Self-pay | Admitting: Family Medicine

## 2020-01-16 ENCOUNTER — Ambulatory Visit (INDEPENDENT_AMBULATORY_CARE_PROVIDER_SITE_OTHER): Payer: Medicare Other | Admitting: Family Medicine

## 2020-01-16 VITALS — BP 170/76 | HR 63 | Temp 97.3°F | Resp 16 | Ht 64.0 in | Wt 152.0 lb

## 2020-01-16 DIAGNOSIS — R1010 Upper abdominal pain, unspecified: Secondary | ICD-10-CM | POA: Diagnosis not present

## 2020-01-16 LAB — CBC WITH DIFFERENTIAL/PLATELET
Absolute Monocytes: 626 cells/uL (ref 200–950)
Basophils Absolute: 72 cells/uL (ref 0–200)
Basophils Relative: 1 %
Eosinophils Absolute: 94 cells/uL (ref 15–500)
Eosinophils Relative: 1.3 %
HCT: 38.3 % (ref 35.0–45.0)
Hemoglobin: 13.1 g/dL (ref 11.7–15.5)
Lymphs Abs: 1793 cells/uL (ref 850–3900)
MCH: 31.6 pg (ref 27.0–33.0)
MCHC: 34.2 g/dL (ref 32.0–36.0)
MCV: 92.5 fL (ref 80.0–100.0)
MPV: 10.8 fL (ref 7.5–12.5)
Monocytes Relative: 8.7 %
Neutro Abs: 4615 cells/uL (ref 1500–7800)
Neutrophils Relative %: 64.1 %
Platelets: 260 10*3/uL (ref 140–400)
RBC: 4.14 10*6/uL (ref 3.80–5.10)
RDW: 12.4 % (ref 11.0–15.0)
Total Lymphocyte: 24.9 %
WBC: 7.2 10*3/uL (ref 3.8–10.8)

## 2020-01-16 LAB — COMPLETE METABOLIC PANEL WITH GFR
AG Ratio: 2 (calc) (ref 1.0–2.5)
ALT: 21 U/L (ref 6–29)
AST: 20 U/L (ref 10–35)
Albumin: 4.5 g/dL (ref 3.6–5.1)
Alkaline phosphatase (APISO): 59 U/L (ref 37–153)
BUN: 18 mg/dL (ref 7–25)
CO2: 25 mmol/L (ref 20–32)
Calcium: 9.5 mg/dL (ref 8.6–10.4)
Chloride: 102 mmol/L (ref 98–110)
Creat: 0.69 mg/dL (ref 0.60–0.93)
GFR, Est African American: 99 mL/min/{1.73_m2} (ref >=60)
GFR, Est Non African American: 86 mL/min/{1.73_m2} (ref >=60)
Globulin: 2.2 g/dL (calc) (ref 1.9–3.7)
Glucose, Bld: 87 mg/dL (ref 65–99)
Potassium: 3.8 mmol/L (ref 3.5–5.3)
Sodium: 138 mmol/L (ref 135–146)
Total Bilirubin: 0.4 mg/dL (ref 0.2–1.2)
Total Protein: 6.7 g/dL (ref 6.1–8.1)

## 2020-01-16 LAB — LIPASE: Lipase: 29 U/L (ref 7–60)

## 2020-01-16 MED ORDER — PANTOPRAZOLE SODIUM 40 MG PO TBEC: 40.0000 mg | DELAYED_RELEASE_TABLET | Freq: Every day | ORAL | 3 refills | Status: DC

## 2020-01-16 NOTE — Progress Notes (Signed)
Subjective:    Patient ID: Stacey Moody, female    DOB: 12/02/1946, 74 y.o.   MRN: [id number]  HPI  Patient was recently admitted to the hospital with substernal subxiphoid chest pain.  The pain would occur if you like a pressure sensation with shortness of breath and nausea.  This prompted her to go to the emergency room where she was admitted to the hospital and ultimately underwent cardiology consultation and catheterization.  She was found to have nonobstructive coronary artery disease medically managed.  Patient continues to have episodes of substernal subxiphoid discomfort.  However usually occurs approximately 1 hour after eating.  The pain will start just below her xiphoid process.  It will bore into her back.  She is also been having more gas and belching recently.  She denies any melena or hematochezia.  She denies any vomiting although she has been having increasing nausea associated with the pain.  She denies any constipation.  She denies any fevers or chills. Past Medical History:  Diagnosis Date  . Carotid artery stenosis   . GERD (gastroesophageal reflux disease)   . Hyperlipidemia   . Hypertension   . Influenza A 01/01/2014  . Osteopenia    T-2.3  . Right renal artery stenosis ([place])   . Syncope 12/31/2013   Vasovagal or hypovolemia/orthostatic   Past Surgical History:  Procedure Laterality Date  . ABDOMINAL HYSTERECTOMY  1982   Partial hysterectomy  . COLONOSCOPY N/A 07/30/2016   Procedure: COLONOSCOPY;  Surgeon: Daneil Dolin, MD;  [facility];  Service: Endoscopy;  Laterality: N/A;  8:30 AM  . CT SCAN  July 2015   Chest  . LEFT HEART CATH AND CORONARY ANGIOGRAPHY N/A 01/09/2020   Procedure: LEFT HEART CATH AND CORONARY ANGIOGRAPHY;  Surgeon: Nelva Bush, MD;  Location: [place] [facility];  Service: Cardiovascular;  Laterality: N/A;  . POLYPECTOMY  07/30/2016   Procedure: POLYPECTOMY;  Surgeon: Daneil Dolin, MD;  [facility];  Service:  Endoscopy;;  descending colon  . SMALL INTESTINE SURGERY     Current Outpatient Medications on File Prior to Visit  Medication Sig Dispense Refill  . amLODipine (NORVASC) 5 MG tablet Take 5 mg by mouth daily.    Marland Kitchen aspirin EC 81 MG tablet Take 81 mg by mouth daily.    . carvedilol (COREG) 3.125 MG tablet Take 1 tablet (3.125 mg total) by mouth 2 (two) times daily with a meal. 60 tablet 0  . cholecalciferol (VITAMIN D) 1000 UNITS tablet Take 1,000 Units by mouth daily.    . fluticasone (FLONASE) 50 MCG/ACT nasal spray Place 2 sprays into both nostrils daily. 16 g 6  . hydrochlorothiazide (HYDRODIURIL) 25 MG tablet TAKE 1 TABLET BY MOUTH EVERY DAY 90 tablet 2  . losartan (COZAAR) 100 MG tablet TAKE 1 TABLET(100 MG) BY MOUTH DAILY 90 tablet 2  . rosuvastatin (CRESTOR) 20 MG tablet TAKE 1 TABLET BY MOUTH EVERY DAY 90 tablet 2   No current facility-administered medications on file prior to visit.   Allergies  Allergen Reactions  . Tetanus Toxoids Swelling   Social History   Socioeconomic History  . Marital status: Widowed    Spouse name: Not on file  . Number of children: Not on file  . Years of education: Not on file  . Highest education level: Not on file  Occupational History  . Not on file  Tobacco Use  . Smoking status: Former Smoker    Quit date:  12/22/1996    Years since quitting: 23.0  . Smokeless tobacco: Never Used  Substance and Sexual Activity  . Alcohol use: No  . Drug use: No  . Sexual activity: Not on file  Other Topics Concern  . Not on file  Social History Narrative  . Not on file   Social Determinants of Health   Financial Resource Strain:   . Difficulty of Paying Living Expenses: Not on file  Food Insecurity:   . Worried About Charity fundraiser in the Last Year: Not on file  . Ran Out of Food in the Last Year: Not on file  Transportation Needs:   . Lack of Transportation (Medical): Not on file  . Lack of Transportation (Non-Medical): Not on file    Physical Activity:   . Days of Exercise per Week: Not on file  . Minutes of Exercise per Session: Not on file  Stress:   . Feeling of Stress : Not on file  Social Connections:   . Frequency of Communication with Friends and Family: Not on file  . Frequency of Social Gatherings with Friends and Family: Not on file  . Attends Religious Services: Not on file  . Active Member of Clubs or Organizations: Not on file  . Attends Archivist Meetings: Not on file  . Marital Status: Not on file  Intimate Partner Violence:   . Fear of Current or Ex-Partner: Not on file  . Emotionally Abused: Not on file  . Physically Abused: Not on file  . Sexually Abused: Not on file      Review of Systems  All other systems reviewed and are negative.      Objective:   Physical Exam  Constitutional: She appears well-developed and well-nourished.  Cardiovascular: Normal rate, regular rhythm and normal heart sounds.  No murmur heard. Pulmonary/Chest: Effort normal and breath sounds normal. No respiratory distress. She has no wheezes. She has no rales.  Abdominal: Soft. Bowel sounds are normal. She exhibits no distension. There is no abdominal tenderness. There is no rebound and no guarding.    Vitals reviewed.         Assessment & Plan:  Pain of upper abdomen - Plan: Lipase, CBC with Differential/Platelet, COMPLETE METABOLIC PANEL WITH GFR, US Abdomen Limited RUQ Differential diagnosis pancreatitis, biliary colic, peptic ulcer/duodenal ulcer.  Begin empiric therapy with pantoprazole 40 mg a day.  Meanwhile obtain a right upper quadrant ultrasound.  Check CBC, CMP, lipase.  I suspect biliary colic or ulcer is most likely explanation.  Reassess the patient in 1 week to see if the symptoms have improved on pantoprazole and wait the results of the right upper quadrant ultrasound.

## 2020-01-23 ENCOUNTER — Other Ambulatory Visit: Payer: Self-pay

## 2020-01-23 ENCOUNTER — Ambulatory Visit (HOSPITAL_COMMUNITY)
Admission: RE | Admit: 2020-01-23 | Discharge: 2020-01-23 | Disposition: A | Discharge status: Home / Self Care | Payer: Medicare Other | Source: Ambulatory Visit | Attending: Family Medicine | Admitting: Family Medicine

## 2020-01-23 DIAGNOSIS — R1010 Upper abdominal pain, unspecified: Secondary | ICD-10-CM | POA: Insufficient documentation

## 2020-01-26 ENCOUNTER — Encounter: Payer: Self-pay | Admitting: Cardiology

## 2020-01-26 ENCOUNTER — Other Ambulatory Visit: Payer: Self-pay

## 2020-01-26 ENCOUNTER — Ambulatory Visit (INDEPENDENT_AMBULATORY_CARE_PROVIDER_SITE_OTHER): Payer: Medicare Other | Admitting: Cardiology

## 2020-01-26 VITALS — BP 120/60 | HR 61 | Ht 63.0 in | Wt 154.0 lb

## 2020-01-26 DIAGNOSIS — I6523 Occlusion and stenosis of bilateral carotid arteries: Secondary | ICD-10-CM | POA: Diagnosis not present

## 2020-01-26 DIAGNOSIS — I2510 Atherosclerotic heart disease of native coronary artery without angina pectoris: Secondary | ICD-10-CM | POA: Diagnosis not present

## 2020-01-26 DIAGNOSIS — I10 Essential (primary) hypertension: Secondary | ICD-10-CM

## 2020-01-26 DIAGNOSIS — E785 Hyperlipidemia, unspecified: Secondary | ICD-10-CM

## 2020-01-26 NOTE — Progress Notes (Signed)
Cardiology Office Note:    Date:  01/26/2020   ID:  Stacey Moody, DOB [birth year], MRN [id number]  PCP:  Susy Frizzle, MD  Cardiologist:  Jenkins Rouge, MD  Referring MD: Susy Frizzle, MD   Chief Complaint  Patient presents with  . Hospitalization Follow-up  . Coronary Artery Disease    History of Present Illness:    Stacey Moody is a 74 y.o. female with a past medical history significant for tension, hyperlipidemia, coronary artery stenosis and possible renal artery stenosis by prior imaging in 2018.  Strong family history of CAD in both parents.  She is a former smoker having quit in 1998.  She presented to [facility] on 01/06/2020 for evaluation of centralized chest pain with associated nausea and diaphoresis.  She also had dizziness.  High-sensitivity troponins were negative and her EKG was nonischemic.  Her significant risk factors she was transferred to [facility] for further evaluation.  She underwent left heart cath on 01/09/2020 showed diffuse nonobstructive first diagonal, distal LAD and RCA disease.  There was no wall motion abnormality.  Aggressive preventive therapy was recommended with LDL goal of less than 70, blood pressure less than 130/80 and A1c less than 7.  She was continued on high intensity statin.  [name] is here today for hospital follow-up. She is doing well. She has had no further chest pain. Her cath site is completely healed.  She lives alone. 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 She drives around to dental offices. No formal exercise. She walks outside when it is not so cold as it is now.   She denies chest pain/pressure, shortness of breath, orthopnea, PND, edema, palpitations, lightheadedness or syncope.    Cardiac studies   LEFT HEART CATH AND CORONARY ANGIOGRAPHY 01/09/2020   Conclusions: 1. Mild, non-obstructive coronary artery disease, including 10-20% distal LMCA and 30% proximal RCA stenoses. 2. Low  left ventricular filling pressure.  Recommendations: 1. Medical therapy and risk factor modification to prevent progression of coronary artery disease. 2. Post-cath hydration.     Echocardiogram: 01/07/2020 IMPRESSION: 1. Left ventricular ejection fraction, by visual estimation, is 60 to 65%. The left ventricle has normal function. There is mildly increased left ventricular hypertrophy. 2. The left ventricle has no regional wall motion abnormalities. 3. Global right ventricle has normal systolic function.The right ventricular size is normal. No increase in right ventricular wall thickness. 4. Left atrial size was normal. 5. Right atrial size was normal. 6. The mitral valve is normal in structure. No evidence of mitral valve regurgitation. No evidence of mitral stenosis. 7. The tricuspid valve is normal in structure. 8. The aortic valve is normal in structure. Aortic valve regurgitation is not visualized. No evidence of aortic valve sclerosis or stenosis. 9. The pulmonic valve was normal in structure. Pulmonic valve regurgitation is not visualized. 10. TR signal is inadequate for assessing pulmonary artery systolic pressure. 11. The inferior vena cava is normal in size with greater than 50% respiratory variability, suggesting right atrial pressure of 3 mmHg.  Carotid Dopplers: 07/2018 Final Interpretation: Right Carotid: Velocities in the right ICA are consistent with a 1-39% stenosis. The ECA appears >50% stenosed.  Left Carotid: Velocities in the left ICA are consistent with a 40-59% stenosis.  Vertebrals: Bilateral vertebral arteries demonstrate antegrade flow. Subclavians: Normal flow hemodynamics were seen in bilateral subclavian arteries.  Past Medical History:  Diagnosis Date  . Carotid artery stenosis   .  GERD (gastroesophageal reflux disease)   . Hyperlipidemia   . Hypertension   . Influenza A 01/01/2014  . Osteopenia    T-2.3    . Right renal artery stenosis ([place])   . Syncope 12/31/2013   Vasovagal or hypovolemia/orthostatic    Past Surgical History:  Procedure Laterality Date  . ABDOMINAL HYSTERECTOMY  1982   Partial hysterectomy  . COLONOSCOPY N/A 07/30/2016   Procedure: COLONOSCOPY;  Surgeon: Daneil Dolin, MD;  [facility];  Service: Endoscopy;  Laterality: N/A;  8:30 AM  . CT SCAN  July 2015   Chest  . LEFT HEART CATH AND CORONARY ANGIOGRAPHY N/A 01/09/2020   Procedure: LEFT HEART CATH AND CORONARY ANGIOGRAPHY;  Surgeon: Nelva Bush, MD;  Location: [place] [facility];  Service: Cardiovascular;  Laterality: N/A;  . POLYPECTOMY  07/30/2016   Procedure: POLYPECTOMY;  Surgeon: Daneil Dolin, MD;  [facility];  Service: Endoscopy;;  descending colon  . SMALL INTESTINE SURGERY      Current Medications: Current Meds  Medication Sig  . amLODipine (NORVASC) 5 MG tablet Take 5 mg by mouth daily.  Marland Kitchen aspirin EC 81 MG tablet Take 81 mg by mouth daily.  . carvedilol (COREG) 3.125 MG tablet Take 1 tablet (3.125 mg total) by mouth 2 (two) times daily with a meal.  . cholecalciferol (VITAMIN D) 1000 UNITS tablet Take 1,000 Units by mouth daily.  . fluticasone (FLONASE) 50 MCG/ACT nasal spray Place 2 sprays into both nostrils daily.  . hydrochlorothiazide (HYDRODIURIL) 25 MG tablet TAKE 1 TABLET BY MOUTH EVERY DAY  . losartan (COZAAR) 100 MG tablet TAKE 1 TABLET(100 MG) BY MOUTH DAILY  . pantoprazole (PROTONIX) 40 MG tablet Take 1 tablet (40 mg total) by mouth daily.  . rosuvastatin (CRESTOR) 20 MG tablet TAKE 1 TABLET BY MOUTH EVERY DAY     Allergies:   Tetanus toxoids   Social History   Socioeconomic History  . Marital status: Widowed    Spouse name: Not on file  . Number of children: Not on file  . Years of education: Not on file  . Highest education level: Not on file  Occupational History  . Not on file  Tobacco Use  . Smoking status: Former Smoker    Quit date: 12/22/1996     Years since quitting: 23.1  . Smokeless tobacco: Never Used  Substance and Sexual Activity  . Alcohol use: No  . Drug use: No  . Sexual activity: Not on file  Other Topics Concern  . Not on file  Social History Narrative  . Not on file   Social Determinants of Health   Financial Resource Strain:   . Difficulty of Paying Living Expenses: Not on file  Food Insecurity:   . Worried About Charity fundraiser in the Last Year: Not on file  . Ran Out of Food in the Last Year: Not on file  Transportation Needs:   . Lack of Transportation (Medical): Not on file  . Lack of Transportation (Non-Medical): Not on file  Physical Activity:   . Days of Exercise per Week: Not on file  . Minutes of Exercise per Session: Not on file  Stress:   . Feeling of Stress : Not on file  Social Connections:   . Frequency of Communication with Friends and Family: Not on file  . Frequency of Social Gatherings with Friends and Family: Not on file  . Attends Religious Services: Not on file  . Active  Member of Clubs or Organizations: Not on file  . Attends Archivist Meetings: Not on file  . Marital Status: Not on file     Family History: The patient's family history includes Cancer in her brother, father, mother, sister, sister, and sister; Diabetes in her father; Heart attack in her brother and mother; Heart disease in her father; Hyperlipidemia in her brother, father, sister, sister, and sister. ROS:   Please see the history of present illness.     All other systems reviewed and are negative.   EKG:  EKG is not ordered today.    Recent Labs: 01/16/2020: ALT 21; BUN 18; Creat 0.69; Hemoglobin 13.1; Platelets 260; Potassium 3.8; Sodium 138   Recent Lipid Panel    Component Value Date/Time   CHOL 131 01/07/2020 0706   TRIG 63 01/07/2020 0706   HDL 64 01/07/2020 0706   CHOLHDL 2.0 01/07/2020 0706   VLDL 13 01/07/2020 0706   LDLCALC 54 01/07/2020 0706    Physical Exam:    VS:  There  were no vitals taken for this visit.    Wt Readings from Last 6 Encounters:  01/16/20 152 lb (68.9 kg)  01/09/20 150 lb 4.8 oz (68.2 kg)  10/01/18 156 lb (70.8 kg)  08/16/18 159 lb (72.1 kg)  05/27/18 158 lb (71.7 kg)  03/05/18 155 lb (70.3 kg)     Physical Exam  Constitutional: She is oriented to person, place, and time. She appears well-developed and well-nourished. No distress.  HENT:  Head: Normocephalic and atraumatic.  Neck: No JVD present. Carotid bruit is present.  Bilateral carotid bruit  Cardiovascular: Normal rate, regular rhythm, normal heart sounds and intact distal pulses. Exam reveals no gallop and no friction rub.  No murmur heard. Pulmonary/Chest: Effort normal and breath sounds normal. No respiratory distress. She has no wheezes. She has no rales.  Abdominal: Soft. Bowel sounds are normal.  Musculoskeletal:        General: No edema. Normal range of motion.     Cervical back: Normal range of motion and neck supple.  Neurological: She is alert and oriented to person, place, and time.  Skin: Skin is warm and dry.  Psychiatric: She has a normal mood and affect. Her behavior is normal. Thought content normal.  Vitals reviewed.    ASSESSMENT:    1. Coronary artery disease involving native coronary artery of native heart without angina pectoris   2. Essential hypertension   3. Hyperlipidemia LDL goal <70   4. Bilateral extracranial carotid artery stenosis    PLAN:    In order of problems listed above:  CAD -Diffuse mild nonobstructive disease noted on cath 01/09/2020, <=30%. -Echo with normal LV systolic function and no valvular abnormalities. -Aggressive risk factor modification including lipid management, blood pressure control, heart healthy diet and exercise. -She is continued on aspirin and high intensity statin as well as beta-blocker. -No further chest pain.  -I discussed continued follow-up with cardiology, she would prefer in [place], any versus  following with her primary care physician and seeing cardiology as needed.  She prefers to follow-up with her PCP and will come back to cardiology as needed.  Hypertension -She was started on carvedilol, amlodipine decreased to 2.5 mg.  She continues on losartan 100 mg. -BP is well controlled.   Hyperlipidemia, LDL goal <70 -On high intensity statin with rosuvastatin 20 mg daily -Total cholesterol of 131, triglycerides 63, HDL 64 and LDL 54.Continue Crestor.  Carotid artery stenosis -Followed annually by  vascular surgery as an outpatient. -Continues on aspirin and statin.   Medication Adjustments/Labs and Tests Ordered: Current medicines are reviewed at length with the patient today.  Concerns regarding medicines are outlined above. Labs and tests ordered and medication changes are outlined in the patient instructions below:  Patient Instructions  Lifestyle Modifications to Prevent and Treat Heart Disease -Recommend heart healthy/Mediterranean diet, with whole grains, fruits, vegetables, fish, lean meats, nuts, olive oil and avocado oil.  -Limit salt intake to less than 2000 mg per day.  -Recommend moderate walking, starting slowly with a few minutes and working up to 3-5 times/week for 30-50 minutes each session. Aim for at least 150 minutes.week. Goal should be pace of 3 miles/hours, or walking 1.5 miles in 30 minutes -Recommend avoidance of tobacco products. Avoid excess alcohol. -Keep blood pressure well controlled, ideally less than 130/80.      Signed, Daune Perch, NP  01/26/2020 11:39 AM    [place]

## 2020-01-26 NOTE — Patient Instructions (Addendum)
Medication Instructions:  Your physician recommends that you continue on your current medications as directed. Please refer to the Current Medication list given to you today.  *If you need a refill on your cardiac medications before your next appointment, please call your pharmacy*  Lab Work: None ordered   If you have labs (blood work) drawn today and your tests are completely normal, you will receive your results only by: Marland Kitchen MyChart Message (if you have MyChart) OR . A paper copy in the mail If you have any lab test that is abnormal or we need to change your treatment, we will call you to review the results.  Testing/Procedures: None ordered  Follow-Up: You can follow up as needed at the [facility]   Other Instructions  Lifestyle Modifications to Prevent and Treat Heart Disease -Recommend heart healthy/Mediterranean diet, with whole grains, fruits, vegetables, fish, lean meats, nuts, olive oil and avocado oil.  -Limit salt intake to less than 2000 mg per day.  -Recommend moderate walking, starting slowly with a few minutes and working up to 3-5 times/week for 30-50 minutes each session. Aim for at least 150 minutes.week. Goal should be pace of 3 miles/hours, or walking 1.5 miles in 30 minutes -Recommend avoidance of tobacco products. Avoid excess alcohol. -Keep blood pressure well controlled, ideally less than 130/80.

## 2020-02-14 ENCOUNTER — Telehealth: Payer: Self-pay | Admitting: Family Medicine

## 2020-02-14 NOTE — Telephone Encounter (Signed)
Pt called and states that she is feeling much better and wanted to know if she needed to continue taking the medication or can she stop it?

## 2020-02-14 NOTE — Telephone Encounter (Signed)
Ok to stop and see if symptoms return.

## 2020-02-15 NOTE — Telephone Encounter (Signed)
Pt aware via vm 

## 2020-02-21 ENCOUNTER — Other Ambulatory Visit (HOSPITAL_COMMUNITY): Payer: Self-pay | Admitting: Family Medicine

## 2020-02-21 DIAGNOSIS — Z1231 Encounter for screening mammogram for malignant neoplasm of breast: Secondary | ICD-10-CM

## 2020-02-24 ENCOUNTER — Other Ambulatory Visit: Payer: Self-pay

## 2020-02-24 ENCOUNTER — Ambulatory Visit (HOSPITAL_COMMUNITY)
Admission: RE | Admit: 2020-02-24 | Discharge: 2020-02-24 | Disposition: A | Discharge status: Home / Self Care | Payer: Medicare Other | Source: Ambulatory Visit | Attending: Family Medicine | Admitting: Family Medicine

## 2020-02-24 DIAGNOSIS — Z1231 Encounter for screening mammogram for malignant neoplasm of breast: Secondary | ICD-10-CM

## 2020-06-15 ENCOUNTER — Other Ambulatory Visit: Payer: Self-pay | Admitting: Family Medicine

## 2020-07-25 ENCOUNTER — Ambulatory Visit
Admission: EM | Admit: 2020-07-25 | Discharge: 2020-07-25 | Disposition: A | Discharge status: Home / Self Care | Payer: Medicare Other

## 2020-07-25 ENCOUNTER — Encounter (HOSPITAL_COMMUNITY): Payer: Self-pay | Admitting: Emergency Medicine

## 2020-07-25 ENCOUNTER — Emergency Department (HOSPITAL_COMMUNITY)
Admission: EM | Admit: 2020-07-25 | Discharge: 2020-07-25 | Disposition: A | Discharge status: Home / Self Care | Payer: Medicare Other | Attending: Emergency Medicine | Admitting: Emergency Medicine

## 2020-07-25 ENCOUNTER — Emergency Department (HOSPITAL_COMMUNITY): Payer: Medicare Other

## 2020-07-25 ENCOUNTER — Other Ambulatory Visit: Payer: Self-pay

## 2020-07-25 DIAGNOSIS — Z7982 Long term (current) use of aspirin: Secondary | ICD-10-CM | POA: Diagnosis not present

## 2020-07-25 DIAGNOSIS — R079 Chest pain, unspecified: Secondary | ICD-10-CM | POA: Insufficient documentation

## 2020-07-25 DIAGNOSIS — Z87891 Personal history of nicotine dependence: Secondary | ICD-10-CM | POA: Insufficient documentation

## 2020-07-25 DIAGNOSIS — R110 Nausea: Secondary | ICD-10-CM | POA: Insufficient documentation

## 2020-07-25 DIAGNOSIS — Z79899 Other long term (current) drug therapy: Secondary | ICD-10-CM | POA: Insufficient documentation

## 2020-07-25 DIAGNOSIS — R42 Dizziness and giddiness: Secondary | ICD-10-CM | POA: Insufficient documentation

## 2020-07-25 DIAGNOSIS — I10 Essential (primary) hypertension: Secondary | ICD-10-CM | POA: Insufficient documentation

## 2020-07-25 DIAGNOSIS — R0789 Other chest pain: Secondary | ICD-10-CM | POA: Diagnosis not present

## 2020-07-25 LAB — COMPREHENSIVE METABOLIC PANEL WITH GFR
ALT: 20 U/L (ref 0–44)
AST: 24 U/L (ref 15–41)
Albumin: 4.9 g/dL (ref 3.5–5.0)
Alkaline Phosphatase: 63 U/L (ref 38–126)
Anion gap: 13 (ref 5–15)
BUN: 14 mg/dL (ref 8–23)
CO2: 25 mmol/L (ref 22–32)
Calcium: 9.9 mg/dL (ref 8.9–10.3)
Chloride: 101 mmol/L (ref 98–111)
Creatinine, Ser: 0.63 mg/dL (ref 0.44–1.00)
GFR calc Af Amer: >60 mL/min
GFR calc non Af Amer: >60 mL/min
Glucose, Bld: 135 mg/dL — ABNORMAL HIGH (ref 70–99)
Potassium: 3.7 mmol/L (ref 3.5–5.1)
Sodium: 139 mmol/L (ref 135–145)
Total Bilirubin: 0.6 mg/dL (ref 0.3–1.2)
Total Protein: 7.7 g/dL (ref 6.5–8.1)

## 2020-07-25 LAB — CBC
HCT: 39.6 % (ref 36.0–46.0)
Hemoglobin: 13.4 g/dL (ref 12.0–15.0)
MCH: 32.2 pg (ref 26.0–34.0)
MCHC: 33.8 g/dL (ref 30.0–36.0)
MCV: 95.2 fL (ref 80.0–100.0)
Platelets: 234 10*3/uL (ref 150–400)
RBC: 4.16 MIL/uL (ref 3.87–5.11)
RDW: 12.6 % (ref 11.5–15.5)
WBC: 8.8 10*3/uL (ref 4.0–10.5)
nRBC: 0 % (ref 0.0–0.2)

## 2020-07-25 LAB — TROPONIN I (HIGH SENSITIVITY)
Troponin I (High Sensitivity): 3 ng/L (ref <18)
Troponin I (High Sensitivity): 4 ng/L (ref <18)

## 2020-07-25 LAB — LIPASE, BLOOD: Lipase: 25 U/L (ref 11–51)

## 2020-07-25 MED ORDER — SUCRALFATE 1 GM/10ML PO SUSP: 1.0000 g | Freq: Three times a day (TID) | ORAL | 0 refills | Status: DC

## 2020-07-25 MED ORDER — SODIUM CHLORIDE 0.9 % IV BOLUS
500.0000 mL | Freq: Once | INTRAVENOUS | Status: AC
  Administered 2020-07-25: 500 mL via INTRAVENOUS

## 2020-07-25 MED ORDER — ASPIRIN 81 MG PO CHEW
324.0000 mg | CHEWABLE_TABLET | Freq: Once | ORAL | Status: AC
  Administered 2020-07-25: 324 mg via ORAL
  Filled 2020-07-25: qty 4

## 2020-07-25 NOTE — ED Provider Notes (Signed)
[facility] EMERGENCY DEPARTMENT Provider Note   CSN: [id number] Arrival date & time: 07/25/20  1133    History Chief Complaint  Patient presents with  . Chest Pain    Stacey Moody is a 74 y.o. female with a history of hypertension, hyperlipidemia, renal artery stenosis, carotid artery occlusion & stenosis, GERD, & recent L heart cath 12/2019 which shoed mild non-obstructive CAD who presents to the ED from urgent care for evaluation of chest discomfort that began between 0400-0500 this AM. Patient states she went to sleep last night feeling fairly normal, woke up with central non-radiating chest pain described as a deep ache, she stood up to to go the bathroom and became nauseated and a bit lightheaded as if she may pass out, no syncope occurred. She had 3 bowel movements, not with diarrhea or blood present. Her chest pain, lightheadedness, and nausea occurred intermittently lasting a few minutes at a time over the next 2 hours prior to resolution. Have not re-occurred since. No specific alleviating/aggravating factors that she can identify. Similar sxs back in January of this year when they did her heart cath which she states overall looked okay. She denies syncope, dyspnea, diaphoresis, abdominal pain, dizziness like the room spinning, numbness, weakness, headache, visual disturbance,  leg pain/swelling, hemoptysis, recent surgery/trauma, recent long travel, hormone use, personal hx of cancer, or hx of DVT/PE. She ate a healthy dinner last night, she did not drink much yesterday but this is not atypical for her. No recent med changes.      HPI     Past Medical History:  Diagnosis Date  . Carotid artery stenosis   . GERD (gastroesophageal reflux disease)   . Hyperlipidemia   . Hypertension   . Influenza A 01/01/2014  . Osteopenia    T-2.3  . Right renal artery stenosis ([place])   . Syncope 12/31/2013   Vasovagal or hypovolemia/orthostatic    Patient Active Problem List   Diagnosis  Date Noted  . Unstable angina ([place])   . Chest pain 01/07/2020  . Right renal artery stenosis ([place])   . Osteopenia   . Abdominal pain   . Legionella infection ([place])   . Gastroenteritis 12/31/2014  . Elevated transaminase level 12/31/2014  . CAP (community acquired pneumonia) 12/31/2014  . Nausea vomiting and diarrhea 12/30/2014  . Hypokalemia 12/30/2014  . Hyponatremia 12/30/2014  . Dehydration 12/30/2014  . Fever 12/30/2014  . Other pancytopenia ([place]) 01/03/2014  . Carotid artery stenosis 01/03/2014  . Vertigo 01/02/2014  . Right otitis media 01/02/2014  . Leukopenia 01/02/2014  . Influenza A 01/01/2014  . Scalp hematoma 01/01/2014  . Syncope 12/31/2013  . Diarrhea 12/31/2013  . URI (upper respiratory infection) 12/31/2013  . Hypertension   . Hyperlipidemia   . GERD (gastroesophageal reflux disease)   . Occlusion and stenosis of carotid artery without mention of cerebral infarction 06/22/2012    Past Surgical History:  Procedure Laterality Date  . ABDOMINAL HYSTERECTOMY  1982   Partial hysterectomy  . COLONOSCOPY N/A 07/30/2016   Procedure: COLONOSCOPY;  Surgeon: Daneil Dolin, MD;  [facility];  Service: Endoscopy;  Laterality: N/A;  8:30 AM  . CT SCAN  July 2015   Chest  . LEFT HEART CATH AND CORONARY ANGIOGRAPHY N/A 01/09/2020   Procedure: LEFT HEART CATH AND CORONARY ANGIOGRAPHY;  Surgeon: Nelva Bush, MD;  Location: [facility];  Service: Cardiovascular;  Laterality: N/A;  . POLYPECTOMY  07/30/2016   Procedure: POLYPECTOMY;  Surgeon: Herbie Baltimore  Hilton Cork, MD;  Location: [facility];  Service: Endoscopy;;  descending colon  . SMALL INTESTINE SURGERY       OB History   No obstetric history on file.     Family History  Problem Relation Age of Onset  . Cancer Mother        pancratic  . Heart attack Mother   . Cancer Father        stomach  . Diabetes Father   . Hyperlipidemia Father   . Heart disease Father        NOT  before age 40  .  Cancer Sister        lung  . Hyperlipidemia Sister   . Cancer Brother        thyroid, prostate  . Hyperlipidemia Brother   . Heart attack Brother   . Cancer Sister        colon  . Hyperlipidemia Sister   . Cancer Sister        colon  . Hyperlipidemia Sister     Social History   Tobacco Use  . Smoking status: Former Smoker    Quit date: 12/22/1996    Years since quitting: 23.6  . Smokeless tobacco: Never Used  Vaping Use  . Vaping Use: Never used  Substance Use Topics  . Alcohol use: No  . Drug use: No    Home Medications Prior to Admission medications   Medication Sig Start Date End Date Taking? Authorizing Provider  amLODipine (NORVASC) 5 MG tablet Take 5 mg by mouth daily.    [provider]  aspirin EC 81 MG tablet Take 81 mg by mouth daily.    [provider]  carvedilol (COREG) 3.125 MG tablet Take 1 tablet (3.125 mg total) by mouth 2 (two) times daily with a meal. 01/09/20 02/08/20  Antonieta Pert, MD  cholecalciferol (VITAMIN D) 1000 UNITS tablet Take 1,000 Units by mouth daily.    [provider]  fluticasone (FLONASE) 50 MCG/ACT nasal spray Place 2 sprays into both nostrils daily. 01/09/20   Antonieta Pert, MD  hydrochlorothiazide (HYDRODIURIL) 25 MG tablet TAKE 1 TABLET BY MOUTH EVERY DAY 06/15/20   Alycia Rossetti, MD  losartan (COZAAR) 100 MG tablet TAKE 1 TABLET(100 MG) BY MOUTH DAILY 06/15/20   Alycia Rossetti, MD  pantoprazole (PROTONIX) 40 MG tablet Take 1 tablet (40 mg total) by mouth daily. 01/16/20   Susy Frizzle, MD  rosuvastatin (CRESTOR) 20 MG tablet TAKE 1 TABLET BY MOUTH EVERY DAY 06/15/20   Alycia Rossetti, MD    Allergies    Tetanus toxoids  Review of Systems   Review of Systems  Constitutional: Negative for chills and fever.  Respiratory: Negative for shortness of breath.   Cardiovascular: Positive for chest pain (not at present). Negative for leg swelling.  Gastrointestinal: Positive for nausea (not at present).  Negative for abdominal pain, anal bleeding, blood in stool, constipation, diarrhea and vomiting.  Genitourinary: Negative for dysuria.  Musculoskeletal: Negative for myalgias.  Neurological: Positive for light-headedness (not at present). Negative for dizziness, syncope, facial asymmetry, weakness, numbness and headaches.  All other systems reviewed and are negative.   Physical Exam Updated Vital Signs BP (!) 188/63 (BP Location: Right Arm)   Pulse 72   Temp 97.7 F (36.5 C) (Oral)   Resp 18   Ht 5\' 3"  (1.6 m)   Wt 68 kg   SpO2 97%   BMI 26.57 kg/m   Physical Exam Vitals  and nursing note reviewed.  Constitutional:      General: She is not in acute distress.    Appearance: She is well-developed. She is not toxic-appearing.  HENT:     Head: Normocephalic and atraumatic.  Eyes:     General:        Right eye: No discharge.        Left eye: No discharge.     Conjunctiva/sclera: Conjunctivae normal.  Cardiovascular:     Rate and Rhythm: Normal rate and regular rhythm.     Pulses:          Radial pulses are 2+ on the right side and 2+ on the left side.       Dorsalis pedis pulses are 2+ on the right side and 2+ on the left side.  Pulmonary:     Effort: Pulmonary effort is normal. No respiratory distress.     Breath sounds: Normal breath sounds. No wheezing, rhonchi or rales.  Chest:     Chest wall: No tenderness.  Abdominal:     General: There is no distension.     Palpations: Abdomen is soft.     Tenderness: There is no abdominal tenderness. There is no guarding or rebound.  Musculoskeletal:     Cervical back: Neck supple.     Right lower leg: No tenderness. No edema.     Left lower leg: No tenderness. No edema.  Skin:    General: Skin is warm and dry.     Findings: No rash.  Neurological:     Mental Status: She is alert.     Comments: Clear speech.   Psychiatric:        Behavior: Behavior normal.    ED Results / Procedures / Treatments   Labs (all labs ordered  are listed, but only abnormal results are displayed) Labs Reviewed  COMPREHENSIVE METABOLIC PANEL - Abnormal; Notable for the following components:      Result Value   Glucose, Bld 135 (*)    All other components within normal limits  CBC  LIPASE, BLOOD  TROPONIN I (HIGH SENSITIVITY)  TROPONIN I (HIGH SENSITIVITY)    EKG EKG Interpretation  Date/Time:  Wednesday July 25 2020 11:41:05 EDT Ventricular Rate:  73 PR Interval:  150 QRS Duration: 80 QT Interval:  404 QTC Calculation: 445 R Axis:   58 Text Interpretation: Normal sinus rhythm Nonspecific ST abnormality Abnormal ECG No significant change since prior 1/21 Confirmed by Aletta Edouard [id number]) on 07/25/2020 11:50:07 AM   Radiology DG Chest 2 View  Result Date: 07/25/2020 CLINICAL DATA:  Chest pain with nausea and dizziness EXAM: CHEST - 2 VIEW COMPARISON:  January 12, 2020 FINDINGS: Lungs remain somewhat hyperexpanded. No edema or airspace opacity. Heart size and pulmonary vascularity are normal. No adenopathy. There is aortic atherosclerosis. No bone lesions. IMPRESSION: Lungs somewhat hyperexpanded without edema or airspace opacity. Stable cardiac silhouette. Aortic Atherosclerosis (ICD10-I70.0). Electronically Signed   By: Lowella Grip III M.D.   On: 07/25/2020 11:54    Procedures Procedures (including critical care time)  Medications Ordered in ED Medications  aspirin chewable tablet 324 mg (324 mg Oral Given 07/25/20 1236)  sodium chloride 0.9 % bolus 500 mL (500 mLs Intravenous New Bag/Given 07/25/20 1246)    ED Course  I have reviewed the triage vital signs and the nursing notes.  Pertinent labs & imaging results that were available during my care of the patient were reviewed by me and considered in my medical decision making (  see chart for details).  Clinical Course as of Jul 26 1515  Wed Jul 25, 6337  527 74 year old female here with chest discomfort that is since resolved, nausea and dizziness on waking  up this morning.  She said she had a similar attack of this in January and they did not find an etiology for it.  Vitals look good other than some systolic hypertension.  Getting delta troponin.  Likely outpatient follow-up with PCP.   [MB]    Clinical Course User Index [MB] Hayden Rasmussen, MD   MDM Rules/Calculators/A&P                         Patient presents to the emergency department with chest pain. Patient nontoxic appearing, in no apparent distress, vitals without significant abnormality other than elevated BP- has had similar w/ prior visits in January, took her BP meds @ 0700 this AM. Fairly benign physical exam.   DDX: ACS, pulmonary embolism, dissection, pneumothorax, pneumonia, arrhythmia, severe anemia, MSK, GERD, anxiety, pancreatitis, gallbladder pathology, dehydration. Evaluation initiated with labs, EKG, and CXR. Patient on cardiac monitor. Aspirin ordered.   Additional history obtained:  Additional history obtained from nursing note & chart review. Previous records obtained and reviewed: Prior heart catheterization reviewed from 01/09/20:  Conclusions: 1. Mild, non-obstructive coronary artery disease, including 10-20% distal LMCA and 30% proximal RCA stenoses. 2. Low left ventricular filling pressure. Recommendations: 1. Medical therapy and risk factor modification to prevent progression of coronary artery disease. 2. Post-cath hydration. Patient's primary cardiologist: Dr. Johnsie Cancel   EKG: No STEMI, Normal sinus rhythm Nonspecific ST abnormality Abnormal ECG No significant change since prior 1/21 Lab Tests:  I Ordered, reviewed, and interpreted labs, which included:  CBC, CMP, Lipase, Trop x 2- unremarkable, no anemia, significant electrolyte derangement, or troponin elevation.   Imaging Studies ordered:  CXR ordered per triage protocol, I independently visualized and interpreted imaging in agreement with radiologist impression- Lungs somewhat hyperexpanded without  edema or airspace opacity. Stable cardiac silhouette. Aortic Atherosclerosis  Orthostatic VS for the past 24 hrs:  BP- Lying Pulse- Lying BP- Sitting Pulse- Sitting BP- Standing at 0 minutes Pulse- Standing at 0 minutes  07/25/20 1230 170/58 64 172/63 71 172/68 79    EKG without significant change compared to prior, troponins flat, recent heart cath with similar sxs in January of this year reassuring, suspicion for ACS is low. Low risk wells- low suspicion for PE. Pain free, no tearing sensation reported, symmetric pulses, no widening of mediastinum on CXR, doubt dissection. Repeat abdominal exam remains without peritoneal signs. Cardiac monitor reviewed, no notable arrhythmias or tachycardia. BP improved some. Patient remains asymptomatic in the ED. Patient has appeared hemodynamically stable throughout ER visit and appears safe for discharge with close PCP/cardiology follow up. Will provide carafate for trial. I discussed results, treatment plan, need for PCP/cardiology follow-up, and return precautions with the patient. Provided opportunity for questions, patient confirmed understanding and is in agreement with plan.   This is a shared visit with supervising physician Dr. Melina Copa who has independently evaluated patient & provided guidance in evaluation/management/disposition, in agreement with care   Portions of this note were generated with Dragon dictation software. Dictation errors may occur despite best attempts at proofreading.  Final Clinical Impression(s) / ED Diagnoses Final diagnoses:  Chest pain, unspecified type    Rx / DC Orders ED Discharge Orders         Ordered    sucralfate (CARAFATE) 1  GM/10ML suspension  3 times daily with meals & bedtime     Discontinue  Reprint     07/25/20 Stanton, Harmonie Verrastro R, PA-C 07/25/20 1525    Hayden Rasmussen, MD 07/26/20 1015

## 2020-07-25 NOTE — ED Triage Notes (Addendum)
Pt reports woke up out of her sleep with chest discomfort since 5am. Pt reports intermittent nausea, dizziness. Pt was sent over from urgent care and denies any chest pain, dizziness at this time, just continued nausea.

## 2020-07-25 NOTE — Discharge Instructions (Addendum)
You were seen in the emergency department today for chest pain. Your work-up in the emergency department has been overall reassuring. Your labs have been fairly normal and or similar to previous blood work you have had done. Your EKG and the enzyme we use to check your heart did not show an acute heart attack at this time. Your chest x-ray was normal.   We would like you to stay well hydrated. We are sending you home with carafate to take prior to meals/bedtime to see if this may help with your symptoms.   We have prescribed you new medication(s) today. Discuss the medications prescribed today with your pharmacist as they can have adverse effects and interactions with your other medicines including over the counter and prescribed medications. Seek medical evaluation if you start to experience new or abnormal symptoms after taking one of these medicines, seek care immediately if you start to experience difficulty breathing, feeling of your throat closing, facial swelling, or rash as these could be indications of a more serious allergic reaction  We would like you to follow up closely with your primary care provider and/or the cardiologist provided in your discharge instructions within 1-3 days. Return to the ER immediately should you experience any new or worsening symptoms including but not limited to return of pain, worsened pain, vomiting, shortness of breath, dizziness, lightheadedness, passing out, or any other concerns that you may have.

## 2020-07-25 NOTE — ED Triage Notes (Signed)
Pt presents with c/o nausea and loose stools for past couple of days, had intermittent chest [ain starting last night. Denies CP now

## 2020-07-25 NOTE — ED Triage Notes (Signed)
Patient is being discharged from the Urgent Care and sent to the Emergency Department via private vehicle  . Per [name] NP, patient is in need of higher level of care due to abnormal EKG, needs further cardiac workup. Patient is aware and verbalizes understanding of plan of care. Pt states she will have her son and law drive her   Vitals:   07/25/20 1103  BP: (!) 196/75  Pulse: 84  Resp: 16  Temp: 98 F (36.7 C)  SpO2: 98%

## 2020-08-03 ENCOUNTER — Other Ambulatory Visit: Payer: Self-pay

## 2020-08-03 ENCOUNTER — Ambulatory Visit (INDEPENDENT_AMBULATORY_CARE_PROVIDER_SITE_OTHER): Payer: Medicare Other | Admitting: Family Medicine

## 2020-08-03 VITALS — BP 150/68 | HR 72 | Temp 95.1°F | Ht 63.0 in | Wt 154.0 lb

## 2020-08-03 DIAGNOSIS — R0600 Dyspnea, unspecified: Secondary | ICD-10-CM

## 2020-08-03 DIAGNOSIS — R0609 Other forms of dyspnea: Secondary | ICD-10-CM

## 2020-08-03 DIAGNOSIS — I6523 Occlusion and stenosis of bilateral carotid arteries: Secondary | ICD-10-CM | POA: Diagnosis not present

## 2020-08-03 MED ORDER — PREDNISONE 20 MG PO TABS: ORAL_TABLET | ORAL | 0 refills | Status: DC

## 2020-08-03 NOTE — Progress Notes (Signed)
Subjective:    Patient ID: Stacey Moody, female    DOB: [birth year], 74 y.o.   MRN: [id number]  HPI  Patient is a very pleasant 74 year old Caucasian female who presents today for emergency room follow-up.  She was admitted to the hospital in January this year for chest pain and had a catheterization performed that showed a 20% lesion in the left main coronary artery and a 30% lesion in the right coronary artery.  Echocardiogram at that time showed a 60 to 65% ejection fraction.  She states that since Friday of last week she has been having shortness of breath with activity.  She describes it as the inability to take a deep breath.  She states she feels like she is unable to completely fill her lungs with air.  She denies any cough.  She denies any pleurisy.  She denies any hemoptysis.  She denies any night sweats.  However she is becoming easily winded with minimal activity.  She went to the emergency room where they performed a chest x-ray that was completely clear, 2 sets of troponins that were negative, and a repeat EKG that was normal.  She does have some mild downsloping ST depression in V4, V5, and V6.  However this is present even on EKGs dating as far back as 2017 and again she had noncritical coronary artery disease seen on a catheterization 6 months ago.  Starting Wednesday she developed pain in her right shin.  The pain is sharp and electrical-like in nature.  It comes and goes at random times without provocation.  It lasts a few seconds and then goes away.  She is uncertain if they are related.  She has no risk factor for DVT.  She has no leg swelling today.  She has a negative Homans' sign. Past Medical History:  Diagnosis Date  . Carotid artery stenosis   . GERD (gastroesophageal reflux disease)   . Hyperlipidemia   . Hypertension   . Influenza A 01/01/2014  . Osteopenia    T-2.3  . Right renal artery stenosis ([place])   . Syncope 12/31/2013   Vasovagal or hypovolemia/orthostatic   Past  Surgical History:  Procedure Laterality Date  . ABDOMINAL HYSTERECTOMY  1982   Partial hysterectomy  . COLONOSCOPY N/A 07/30/2016   Procedure: COLONOSCOPY;  Surgeon: Daneil Dolin, MD;  [facility];  Service: Endoscopy;  Laterality: N/A;  8:30 AM  . CT SCAN  July 2015   Chest  . LEFT HEART CATH AND CORONARY ANGIOGRAPHY N/A 01/09/2020   Procedure: LEFT HEART CATH AND CORONARY ANGIOGRAPHY;  Surgeon: Nelva Bush, MD;  Location: [facility];  Service: Cardiovascular;  Laterality: N/A;  . POLYPECTOMY  07/30/2016   Procedure: POLYPECTOMY;  Surgeon: Daneil Dolin, MD;  [facility];  Service: Endoscopy;;  descending colon  . SMALL INTESTINE SURGERY     Current Outpatient Medications on File Prior to Visit  Medication Sig Dispense Refill  . aspirin EC 81 MG tablet Take 81 mg by mouth daily.    . cholecalciferol (VITAMIN D) 1000 UNITS tablet Take 1,000 Units by mouth daily.    . fluticasone (FLONASE) 50 MCG/ACT nasal spray Place 2 sprays into both nostrils daily. 16 g 6  . hydrochlorothiazide (HYDRODIURIL) 25 MG tablet TAKE 1 TABLET BY MOUTH EVERY DAY 90 tablet 2  . losartan (COZAAR) 100 MG tablet TAKE 1 TABLET(100 MG) BY MOUTH DAILY 90 tablet 2  . rosuvastatin (CRESTOR) 20 MG tablet  TAKE 1 TABLET BY MOUTH EVERY DAY 90 tablet 2  . sucralfate (CARAFATE) 1 GM/10ML suspension Take 10 mLs (1 g total) by mouth 4 (four) times daily -  with meals and at bedtime. 420 mL 0  . carvedilol (COREG) 3.125 MG tablet Take 1 tablet (3.125 mg total) by mouth 2 (two) times daily with a meal. 60 tablet 0  . pantoprazole (PROTONIX) 40 MG tablet Take 1 tablet (40 mg total) by mouth daily. 30 tablet 3   No current facility-administered medications on file prior to visit.   Allergies  Allergen Reactions  . Tetanus Toxoids Swelling   Social History   Socioeconomic History  . Marital status: Widowed    Spouse name: Not on file  . Number of children: Not on file  . Years of  education: Not on file  . Highest education level: Not on file  Occupational History  . Not on file  Tobacco Use  . Smoking status: Former Smoker    Quit date: 12/22/1996    Years since quitting: 23.6  . Smokeless tobacco: Never Used  Vaping Use  . Vaping Use: Never used  Substance and Sexual Activity  . Alcohol use: No  . Drug use: No  . Sexual activity: Not on file  Other Topics Concern  . Not on file  Social History Narrative  . Not on file   Social Determinants of Health   Financial Resource Strain:   . Difficulty of Paying Living Expenses:   Food Insecurity:   . Worried About Charity fundraiser in the Last Year:   . Arboriculturist in the Last Year:   Transportation Needs:   . Film/video editor (Medical):   Marland Kitchen Lack of Transportation (Non-Medical):   Physical Activity:   . Days of Exercise per Week:   . Minutes of Exercise per Session:   Stress:   . Feeling of Stress :   Social Connections:   . Frequency of Communication with Friends and Family:   . Frequency of Social Gatherings with Friends and Family:   . Attends Religious Services:   . Active Member of Clubs or Organizations:   . Attends Archivist Meetings:   Marland Kitchen Marital Status:   Intimate Partner Violence:   . Fear of Current or Ex-Partner:   . Emotionally Abused:   Marland Kitchen Physically Abused:   . Sexually Abused:       Review of Systems  Musculoskeletal: Positive for extremity weakness.  All other systems reviewed and are negative.      Objective:   Physical Exam Vitals reviewed.  Constitutional:      Appearance: She is well-developed.  Cardiovascular:     Rate and Rhythm: Normal rate and regular rhythm.     Heart sounds: Normal heart sounds. No murmur heard.   Pulmonary:     Effort: Pulmonary effort is normal. No respiratory distress.     Breath sounds: Normal breath sounds. No wheezing or rales.  Abdominal:     General: Bowel sounds are normal. There is no distension.      Palpations: Abdomen is soft.     Tenderness: There is no abdominal tenderness. There is no guarding or rebound.           Assessment & Plan:  Dyspnea on exertion - Plan: CBC with Differential/Platelet, Brain natriuretic peptide, D-dimer, quantitative (not at [facility]), Sedimentation rate Clinical exam today is completely normal.  There is no swelling in her right leg.  There is no erythema.  There is no warmth.  There are no vesicles or rashes.  She has normal strength in her right leg.  Her lungs are completely clear and her heart sounds are normal.  I will check a D-dimer to evaluate for possible DVT however I believe that the chance of that is extremely low.  I will check a BNP although her echocardiogram was normal in January.  I will check a sedimentation rate along with a CBC to evaluate for any evidence of inflammation to suggest an underlying autoimmune process.  Begin prednisone taper pack for what I suspect may be right-sided sciatica in the right leg.  Await the results of the lab work.  If lab work is normal, proceed with a CT scan of the lungs as a next work-up for shortness of breath

## 2020-08-04 LAB — CBC WITH DIFFERENTIAL/PLATELET
Absolute Monocytes: 596 cells/uL (ref 200–950)
Basophils Absolute: 47 cells/uL (ref 0–200)
Basophils Relative: 0.7 %
Eosinophils Absolute: 40 cells/uL (ref 15–500)
Eosinophils Relative: 0.6 %
HCT: 37.9 % (ref 35.0–45.0)
Hemoglobin: 12.8 g/dL (ref 11.7–15.5)
Lymphs Abs: 1273 cells/uL (ref 850–3900)
MCH: 31.5 pg (ref 27.0–33.0)
MCHC: 33.8 g/dL (ref 32.0–36.0)
MCV: 93.3 fL (ref 80.0–100.0)
MPV: 10.6 fL (ref 7.5–12.5)
Monocytes Relative: 8.9 %
Neutro Abs: 4744 cells/uL (ref 1500–7800)
Neutrophils Relative %: 70.8 %
Platelets: 245 10*3/uL (ref 140–400)
RBC: 4.06 10*6/uL (ref 3.80–5.10)
RDW: 12.4 % (ref 11.0–15.0)
Total Lymphocyte: 19 %
WBC: 6.7 10*3/uL (ref 3.8–10.8)

## 2020-08-04 LAB — BRAIN NATRIURETIC PEPTIDE: Brain Natriuretic Peptide: 46 pg/mL (ref <100)

## 2020-08-04 LAB — D-DIMER, QUANTITATIVE: D-Dimer, Quant: 0.38 mcg/mL FEU (ref <0.50)

## 2020-08-04 LAB — SEDIMENTATION RATE: Sed Rate: 6 mm/h (ref 0–30)

## 2020-08-06 ENCOUNTER — Telehealth: Payer: Self-pay

## 2020-08-06 NOTE — Telephone Encounter (Signed)
Pt took first dose of prednisone and vomited, she hasn't taking anymore since then, she doesn't have any energy, her legs are weak, she still have difficulty breathing it's not like it was when she was here in the office.

## 2020-08-07 NOTE — Telephone Encounter (Signed)
I am not sure what's going on. I wan to see her back.  Her labs were excellent.  Not sure where to go next.

## 2020-08-07 NOTE — Telephone Encounter (Signed)
Will call Pt to make her an appt to be seen.

## 2020-08-13 ENCOUNTER — Ambulatory Visit (INDEPENDENT_AMBULATORY_CARE_PROVIDER_SITE_OTHER): Payer: Medicare Other | Admitting: Family Medicine

## 2020-08-13 ENCOUNTER — Telehealth: Payer: Self-pay | Admitting: Family Medicine

## 2020-08-13 ENCOUNTER — Other Ambulatory Visit: Payer: Self-pay

## 2020-08-13 VITALS — BP 140/60 | HR 80 | Temp 94.7°F | Ht 63.0 in | Wt 151.0 lb

## 2020-08-13 DIAGNOSIS — R5382 Chronic fatigue, unspecified: Secondary | ICD-10-CM | POA: Diagnosis not present

## 2020-08-13 DIAGNOSIS — R071 Chest pain on breathing: Secondary | ICD-10-CM | POA: Diagnosis not present

## 2020-08-13 DIAGNOSIS — M6281 Muscle weakness (generalized): Secondary | ICD-10-CM

## 2020-08-13 DIAGNOSIS — Z206 Contact with and (suspected) exposure to human immunodeficiency virus [HIV]: Secondary | ICD-10-CM | POA: Diagnosis not present

## 2020-08-13 DIAGNOSIS — R0600 Dyspnea, unspecified: Secondary | ICD-10-CM | POA: Diagnosis not present

## 2020-08-13 DIAGNOSIS — I6523 Occlusion and stenosis of bilateral carotid arteries: Secondary | ICD-10-CM

## 2020-08-13 DIAGNOSIS — R091 Pleurisy: Secondary | ICD-10-CM

## 2020-08-13 DIAGNOSIS — Z20822 Contact with and (suspected) exposure to covid-19: Secondary | ICD-10-CM | POA: Diagnosis not present

## 2020-08-13 DIAGNOSIS — R0609 Other forms of dyspnea: Secondary | ICD-10-CM

## 2020-08-13 NOTE — Progress Notes (Signed)
Subjective:    Patient ID: Stacey Moody, female    DOB: 08-Feb-1946, 74 y.o.   MRN: [id number]  Extremity Weakness   08/03/20 Patient is a very pleasant 74 year old Caucasian female who presents today for emergency room follow-up.  She was admitted to the hospital in January this year for chest pain and had a catheterization performed that showed a 20% lesion in the left main coronary artery and a 30% lesion in the right coronary artery.  Echocardiogram at that time showed a 60 to 65% ejection fraction.  She states that since Friday of last week she has been having shortness of breath with activity.  She describes it as the inability to take a deep breath.  She states she feels like she is unable to completely fill her lungs with air.  She denies any cough.  She denies any pleurisy.  She denies any hemoptysis.  She denies any night sweats.  However she is becoming easily winded with minimal activity.  She went to the emergency room where they performed a chest x-ray that was completely clear, 2 sets of troponins that were negative, and a repeat EKG that was normal.  She does have some mild downsloping ST depression in V4, V5, and V6.  However this is present even on EKGs dating as far back as 2017 and again she had noncritical coronary artery disease seen on a catheterization 6 months ago.  Starting Wednesday she developed pain in her right shin.  The pain is sharp and electrical-like in nature.  It comes and goes at random times without provocation.  It lasts a few seconds and then goes away.  She is uncertain if they are related.  She has no risk factor for DVT.  She has no leg swelling today.  She has a negative Homans' sign.  At that time, my plan was: Clinical exam today is completely normal.  There is no swelling in her right leg.  There is no erythema.  There is no warmth.  There are no vesicles or rashes.  She has normal strength in her right leg.  Her lungs are completely clear and her heart sounds  are normal.  I will check a D-dimer to evaluate for possible DVT however I believe that the chance of that is extremely low.  I will check a BNP although her echocardiogram was normal in January.  I will check a sedimentation rate along with a CBC to evaluate for any evidence of inflammation to suggest an underlying autoimmune process.  Begin prednisone taper pack for what I suspect may be right-sided sciatica in the right leg.  Await the results of the lab work.  If lab work is normal, proceed with a CT scan of the lungs as a next work-up for shortness of breath .   Office Visit on 08/03/2020  Component Date Value Ref Range Status  . WBC 08/03/2020 6.7  3.8 - 10.8 Thousand/uL Final  . RBC 08/03/2020 4.06  3.80 - 5.10 Million/uL Final  . Hemoglobin 08/03/2020 12.8  11.7 - 15.5 g/dL Final  . HCT 08/03/2020 37.9  35 - 45 % Final  . MCV 08/03/2020 93.3  80.0 - 100.0 fL Final  . MCH 08/03/2020 31.5  27.0 - 33.0 pg Final  . MCHC 08/03/2020 33.8  32.0 - 36.0 g/dL Final  . RDW 08/03/2020 12.4  11.0 - 15.0 % Final  . Platelets 08/03/2020 245  140 - 400 Thousand/uL Final  . MPV 08/03/2020 10.6  7.5 - 12.5 fL Final  . Neutro Abs 08/03/2020 4,744  1,500 - 7,800 cells/uL Final  . Lymphs Abs 08/03/2020 1,273  850 - 3,900 cells/uL Final  . Absolute Monocytes 08/03/2020 596  200 - 950 cells/uL Final  . Eosinophils Absolute 08/03/2020 40  15 - 500 cells/uL Final  . Basophils Absolute 08/03/2020 47  0 - 200 cells/uL Final  . Neutrophils Relative % 08/03/2020 70.8  % Final  . Total Lymphocyte 08/03/2020 19.0  % Final  . Monocytes Relative 08/03/2020 8.9  % Final  . Eosinophils Relative 08/03/2020 0.6  % Final  . Basophils Relative 08/03/2020 0.7  % Final  . Brain Natriuretic Peptide 08/03/2020 46  <100 pg/mL Final   Comment: . BNP levels increase with age in the general population with the highest values seen in individuals greater than 24 years of age. Reference: J. Am. [place] Ar. Cardiol. 2002;  57:262-035. .   . D-Dimer, Quant 08/03/2020 0.38  <0.50 mcg/mL FEU Final   Comment: . The D-Dimer test is used frequently to exclude an acute PE or DVT. In patients with a low to moderate clinical risk assessment and a D-Dimer result <0.50 mcg/mL FEU, the likelihood of a PE or DVT is very low. However, a thromboembolic event should not be excluded solely on the basis of the D-Dimer level. Increased levels of D-Dimer are associated with a PE, DVT, DIC, malignancies, inflammation, sepsis, surgery, trauma, pregnancy, and advancing patient age. [Jama 2006 11:295(2):199-207] . For additional information, please refer to: http://education.questdiagnostics.com/faq/FAQ149 (This link is being provided for informational/ educational purposes only) .   Marland Kitchen Sed Rate 08/03/2020 6  0 - 30 mm/h Final  Admission on 07/25/2020, Discharged on 07/25/2020  Component Date Value Ref Range Status  . WBC 07/25/2020 8.8  4.0 - 10.5 K/uL Final  . RBC 07/25/2020 4.16  3.87 - 5.11 MIL/uL Final  . Hemoglobin 07/25/2020 13.4  12.0 - 15.0 g/dL Final  . HCT 07/25/2020 39.6  36 - 46 % Final  . MCV 07/25/2020 95.2  80.0 - 100.0 fL Final  . MCH 07/25/2020 32.2  26.0 - 34.0 pg Final  . MCHC 07/25/2020 33.8  30.0 - 36.0 g/dL Final  . RDW 07/25/2020 12.6  11.5 - 15.5 % Final  . Platelets 07/25/2020 234  150 - 400 K/uL Final  . nRBC 07/25/2020 0.0  0.0 - 0.2 % Final   Performed at [facility], [address] [place], [place] [address]  . Troponin I (High Sensitivity) 07/25/2020 3  <18 ng/L Final   Performed at [facility], [address] [place], [place] [address]  . Sodium 07/25/2020 139  135 - 145 mmol/L Final  . Potassium 07/25/2020 3.7  3.5 - 5.1 mmol/L Final  . Chloride 07/25/2020 101  98 - 111 mmol/L Final  . CO2 07/25/2020 25  22 - 32 mmol/L Final  . Glucose, Bld 07/25/2020 135* 70 - 99 mg/dL Final   Glucose reference range applies only to samples taken after fasting for at least 8 hours.  . BUN  07/25/2020 14  8 - 23 mg/dL Final  . Creatinine, Ser 07/25/2020 0.63  0.44 - 1.00 mg/dL Final  . Calcium 07/25/2020 9.9  8.9 - 10.3 mg/dL Final  . Total Protein 07/25/2020 7.7  6.5 - 8.1 g/dL Final  . Albumin 07/25/2020 4.9  3.5 - 5.0 g/dL Final  . AST 07/25/2020 24  15 - 41 U/L Final  . ALT 07/25/2020 20  0 - 44 U/L Final  .  Alkaline Phosphatase 07/25/2020 63  38 - 126 U/L Final  . Total Bilirubin 07/25/2020 0.6  0.3 - 1.2 mg/dL Final  . GFR calc non Af Amer 07/25/2020 >60  >60 mL/min Final  . GFR calc Af Amer 07/25/2020 >60  >60 mL/min Final  . Anion gap 07/25/2020 13  5 - 15 Final   Performed at [facility], [address], [place], [place] [address]  . Lipase 07/25/2020 25  11 - 51 U/L Final   Performed at [facility], [address] [place], [place] [address]  . Troponin I (High Sensitivity) 07/25/2020 4  <18 ng/L Final   Comment: (NOTE) Elevated high sensitivity troponin I (hsTnI) values and significant  changes across serial measurements may suggest ACS but many other  chronic and acute conditions are known to elevate hsTnI results.  Refer to the "Links" section for chest pain algorithms and additional  guidance. Performed at [facility], [address] [place],  [address]      Patient was unable to take prednisone due to vomiting.   08/13/20 Patient still reports extreme fatigue.  She states that she is getting extremely short of breath with minimal activity.  Her legs feel weak and fatigued with very little physical activity.  Her biggest issue would be the shortness of breath.  She denies any hemoptysis.  She denies any cough.  She denies any fevers or chills.  Symptoms have been present now for 3 weeks.  She does have pleurisy in the center of her chest.  She feels like she cannot catch her breath.  She denies any muscle aches in her legs however her legs feel weak with very little activity like there is no strength.  She denies any claudication.  She denies any  numbness or tingling. Past Medical History:  Diagnosis Date  . Carotid artery stenosis   . GERD (gastroesophageal reflux disease)   . Hyperlipidemia   . Hypertension   . Influenza A 01/01/2014  . Osteopenia    T-2.3  . Right renal artery stenosis ([place])   . Syncope 12/31/2013   Vasovagal or hypovolemia/orthostatic   Past Surgical History:  Procedure Laterality Date  . ABDOMINAL HYSTERECTOMY  1982   Partial hysterectomy  . COLONOSCOPY N/A 07/30/2016   Procedure: COLONOSCOPY;  Surgeon: Daneil Dolin, MD;  [facility];  Service: Endoscopy;  Laterality: N/A;  8:30 AM  . CT SCAN  July 2015   Chest  . LEFT HEART CATH AND CORONARY ANGIOGRAPHY N/A 01/09/2020   Procedure: LEFT HEART CATH AND CORONARY ANGIOGRAPHY;  Surgeon: Nelva Bush, MD;  Location: [place] [facility];  Service: Cardiovascular;  Laterality: N/A;  . POLYPECTOMY  07/30/2016   Procedure: POLYPECTOMY;  Surgeon: Daneil Dolin, MD;  [facility];  Service: Endoscopy;;  descending colon  . SMALL INTESTINE SURGERY     Current Outpatient Medications on File Prior to Visit  Medication Sig Dispense Refill  . aspirin EC 81 MG tablet Take 81 mg by mouth daily.    . carvedilol (COREG) 3.125 MG tablet Take 1 tablet (3.125 mg total) by mouth 2 (two) times daily with a meal. 60 tablet 0  . cholecalciferol (VITAMIN D) 1000 UNITS tablet Take 1,000 Units by mouth daily.    . fluticasone (FLONASE) 50 MCG/ACT nasal spray Place 2 sprays into both nostrils daily. 16 g 6  . hydrochlorothiazide (HYDRODIURIL) 25 MG tablet TAKE 1 TABLET BY MOUTH EVERY DAY 90 tablet 2  . losartan (COZAAR)  100 MG tablet TAKE 1 TABLET(100 MG) BY MOUTH DAILY 90 tablet 2  . pantoprazole (PROTONIX) 40 MG tablet Take 1 tablet (40 mg total) by mouth daily. 30 tablet 3  . predniSONE (DELTASONE) 20 MG tablet 3 tabs poqday 1-2, 2 tabs poqday 3-4, 1 tab poqday 5-6 12 tablet 0  . rosuvastatin (CRESTOR) 20 MG tablet TAKE 1 TABLET BY MOUTH EVERY DAY 90  tablet 2  . sucralfate (CARAFATE) 1 GM/10ML suspension Take 10 mLs (1 g total) by mouth 4 (four) times daily -  with meals and at bedtime. 420 mL 0   No current facility-administered medications on file prior to visit.   Allergies  Allergen Reactions  . Tetanus Toxoids Swelling   Social History   Socioeconomic History  . Marital status: Widowed    Spouse name: Not on file  . Number of children: Not on file  . Years of education: Not on file  . Highest education level: Not on file  Occupational History  . Not on file  Tobacco Use  . Smoking status: Former Smoker    Quit date: 12/22/1996    Years since quitting: 23.6  . Smokeless tobacco: Never Used  Vaping Use  . Vaping Use: Never used  Substance and Sexual Activity  . Alcohol use: No  . Drug use: No  . Sexual activity: Not on file  Other Topics Concern  . Not on file  Social History Narrative  . Not on file   Social Determinants of Health   Financial Resource Strain:   . Difficulty of Paying Living Expenses: Not on file  Food Insecurity:   . Worried About Charity fundraiser in the Last Year: Not on file  . Ran Out of Food in the Last Year: Not on file  Transportation Needs:   . Lack of Transportation (Medical): Not on file  . Lack of Transportation (Non-Medical): Not on file  Physical Activity:   . Days of Exercise per Week: Not on file  . Minutes of Exercise per Session: Not on file  Stress:   . Feeling of Stress : Not on file  Social Connections:   . Frequency of Communication with Friends and Family: Not on file  . Frequency of Social Gatherings with Friends and Family: Not on file  . Attends Religious Services: Not on file  . Active Member of Clubs or Organizations: Not on file  . Attends Archivist Meetings: Not on file  . Marital Status: Not on file  Intimate Partner Violence:   . Fear of Current or Ex-Partner: Not on file  . Emotionally Abused: Not on file  . Physically Abused: Not on file   . Sexually Abused: Not on file      Review of Systems  Musculoskeletal: Positive for extremity weakness.  All other systems reviewed and are negative.      Objective:   Physical Exam Vitals reviewed.  Constitutional:      Appearance: She is well-developed.  Cardiovascular:     Rate and Rhythm: Normal rate and regular rhythm.     Heart sounds: Normal heart sounds. No murmur heard.   Pulmonary:     Effort: Pulmonary effort is normal. No respiratory distress.     Breath sounds: Rales present. No wheezing.  Abdominal:     General: Bowel sounds are normal. There is no distension.     Palpations: Abdomen is soft.     Tenderness: There is no abdominal tenderness. There is no guarding  or rebound.  Musculoskeletal:     Right lower leg: No edema.     Left lower leg: No edema.           Assessment & Plan:  Chronic fatigue - Plan: HIV antibody (with reflex), SARS CoV2 Serology(COVID19) AB(IgG,IgM),Immunoassay  Close exposure to COVID-19 virus - Plan: SARS CoV2 Serology(COVID19) AB(IgG,IgM),Immunoassay  HIV exposure - Plan: HIV antibody (with reflex)  Muscle weakness - Plan: CK  I question if the patient may have been exposed to Covid and have long-haul symptoms.  Shortness of breath, fatigue, muscle weakness could certainly go along with the virus.  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  Therefore I will check Covid serologies given the fact her symptoms have been present now for 3 weeks.  I doubt an NAAT test would still be positive.  Patient's husband died from HIV.  She is concerned that she may have active HIV.  I explained to the patient I do not think that these would cause the symptoms that she has however she would like to have an HIV test which I believe is perfectly reasonable.  I will check a CK level to evaluate for myositis.  I recommended that she stop Crestor until the fatigue and her muscles improve.  Given the dyspnea  on exertion and the pleurisy I will obtain a CT scan of the chest given that the initial chest x-ray was normal

## 2020-08-13 NOTE — Telephone Encounter (Signed)
Patient left vm asking if she could have CT set up at [facility] instead of [place].  CB# [phone]

## 2020-08-14 LAB — CK: Total CK: 94 U/L (ref 29–143)

## 2020-08-14 LAB — SARS COV-2 SEROLOGY(COVID-19)AB(IGG,IGM),IMMUNOASSAY
SARS CoV-2 AB IgG: NEGATIVE
SARS CoV-2 IgM: NEGATIVE

## 2020-08-14 LAB — HIV ANTIBODY (ROUTINE TESTING W REFLEX): HIV 1&2 Ab, 4th Generation: NONREACTIVE

## 2020-08-14 NOTE — Telephone Encounter (Signed)
Patient is scheduled for 09/04/20 at [facility]. She is to arrive at 3:45 for a 4 pm scan. She is to have liquids only 4 hours prior to appointment. Patient verbalized understanding of the appointment and liquids only.

## 2020-08-17 ENCOUNTER — Ambulatory Visit: Payer: Medicare Other | Admitting: Physician Assistant

## 2020-08-22 ENCOUNTER — Other Ambulatory Visit: Payer: Self-pay

## 2020-08-22 DIAGNOSIS — I6529 Occlusion and stenosis of unspecified carotid artery: Secondary | ICD-10-CM

## 2020-08-23 DIAGNOSIS — E539 Vitamin B deficiency, unspecified: Secondary | ICD-10-CM | POA: Diagnosis not present

## 2020-08-23 DIAGNOSIS — I10 Essential (primary) hypertension: Secondary | ICD-10-CM | POA: Diagnosis not present

## 2020-08-23 DIAGNOSIS — Z299 Encounter for prophylactic measures, unspecified: Secondary | ICD-10-CM | POA: Diagnosis not present

## 2020-08-23 DIAGNOSIS — E559 Vitamin D deficiency, unspecified: Secondary | ICD-10-CM | POA: Diagnosis not present

## 2020-08-23 DIAGNOSIS — Z6826 Body mass index (BMI) 26.0-26.9, adult: Secondary | ICD-10-CM | POA: Diagnosis not present

## 2020-08-23 DIAGNOSIS — R0602 Shortness of breath: Secondary | ICD-10-CM | POA: Diagnosis not present

## 2020-08-23 DIAGNOSIS — R5383 Other fatigue: Secondary | ICD-10-CM | POA: Diagnosis not present

## 2020-08-23 DIAGNOSIS — Z87891 Personal history of nicotine dependence: Secondary | ICD-10-CM | POA: Diagnosis not present

## 2020-08-30 DIAGNOSIS — D225 Melanocytic nevi of trunk: Secondary | ICD-10-CM | POA: Diagnosis not present

## 2020-08-30 DIAGNOSIS — L7211 Pilar cyst: Secondary | ICD-10-CM | POA: Diagnosis not present

## 2020-09-04 ENCOUNTER — Ambulatory Visit (INDEPENDENT_AMBULATORY_CARE_PROVIDER_SITE_OTHER): Payer: Medicare Other | Admitting: Physician Assistant

## 2020-09-04 ENCOUNTER — Ambulatory Visit (HOSPITAL_COMMUNITY)
Admission: RE | Admit: 2020-09-04 | Discharge: 2020-09-04 | Disposition: A | Discharge status: Home / Self Care | Payer: Medicare Other | Source: Ambulatory Visit | Attending: Vascular Surgery | Admitting: Vascular Surgery

## 2020-09-04 ENCOUNTER — Other Ambulatory Visit: Payer: Self-pay

## 2020-09-04 ENCOUNTER — Ambulatory Visit (HOSPITAL_COMMUNITY)
Admission: RE | Admit: 2020-09-04 | Discharge: 2020-09-04 | Disposition: A | Discharge status: Home / Self Care | Payer: Medicare Other | Source: Ambulatory Visit | Attending: Family Medicine | Admitting: Family Medicine

## 2020-09-04 VITALS — BP 150/70 | HR 65 | Temp 98.1°F | Resp 20 | Ht 63.0 in | Wt 147.1 lb

## 2020-09-04 DIAGNOSIS — R091 Pleurisy: Secondary | ICD-10-CM | POA: Insufficient documentation

## 2020-09-04 DIAGNOSIS — I6529 Occlusion and stenosis of unspecified carotid artery: Secondary | ICD-10-CM | POA: Insufficient documentation

## 2020-09-04 DIAGNOSIS — R0600 Dyspnea, unspecified: Secondary | ICD-10-CM | POA: Insufficient documentation

## 2020-09-04 DIAGNOSIS — R071 Chest pain on breathing: Secondary | ICD-10-CM | POA: Insufficient documentation

## 2020-09-04 DIAGNOSIS — J8410 Pulmonary fibrosis, unspecified: Secondary | ICD-10-CM | POA: Diagnosis not present

## 2020-09-04 DIAGNOSIS — J984 Other disorders of lung: Secondary | ICD-10-CM | POA: Diagnosis not present

## 2020-09-04 DIAGNOSIS — R0609 Other forms of dyspnea: Secondary | ICD-10-CM

## 2020-09-04 DIAGNOSIS — I700 Atherosclerosis of aorta: Secondary | ICD-10-CM | POA: Diagnosis not present

## 2020-09-04 LAB — POCT I-STAT CREATININE: Creatinine, Ser: 0.8 mg/dL (ref 0.44–1.00)

## 2020-09-04 MED ORDER — IOHEXOL 300 MG/ML  SOLN
75.0000 mL | Freq: Once | INTRAMUSCULAR | Status: AC | PRN
  Administered 2020-09-04: 75 mL via INTRAVENOUS

## 2020-09-04 NOTE — Progress Notes (Signed)
History of Present Illness:  Patient is a 74 y.o. year old female who presents for evaluation of carotid stenosis.  The patient denies symptoms of TIA, amaurosis, or stroke.  She has sudden pains in her legs and arms without pattern or external trauma.  She recently complains of lost appetite for the last month.  CC left forearm and lateral shin pain she describes as vein pain.  She denise edema, history of reflux or non healing wounds.    Diabetic: no Tobacco use: former smoker, quit about 1998  Pt meds include: Statin : yes ASA: yes Other anticoagulants/antiplatelets: no   Past Medical History:  Diagnosis Date  . Carotid artery stenosis   . GERD (gastroesophageal reflux disease)   . Hyperlipidemia   . Hypertension   . Influenza A 01/01/2014  . Osteopenia    T-2.3  . Right renal artery stenosis ([place])   . Syncope 12/31/2013   Vasovagal or hypovolemia/orthostatic    Past Surgical History:  Procedure Laterality Date  . ABDOMINAL HYSTERECTOMY  1982   Partial hysterectomy  . COLONOSCOPY N/A 07/30/2016   Procedure: COLONOSCOPY;  Surgeon: Daneil Dolin, MD;  [facility];  Service: Endoscopy;  Laterality: N/A;  8:30 AM  . CT SCAN  July 2015   Chest  . LEFT HEART CATH AND CORONARY ANGIOGRAPHY N/A 01/09/2020   Procedure: LEFT HEART CATH AND CORONARY ANGIOGRAPHY;  Surgeon: Nelva Bush, MD;  Location: [place] [facility];  Service: Cardiovascular;  Laterality: N/A;  . POLYPECTOMY  07/30/2016   Procedure: POLYPECTOMY;  Surgeon: Daneil Dolin, MD;  [facility];  Service: Endoscopy;;  descending colon  . SMALL INTESTINE SURGERY       Social History Social History   Tobacco Use  . Smoking status: Former Smoker    Quit date: 12/22/1996    Years since quitting: 23.7  . Smokeless tobacco: Never Used  Vaping Use  . Vaping Use: Never used  Substance Use Topics  . Alcohol use: No  . Drug use: No    Family History Family History  Problem Relation  Age of Onset  . Cancer Mother        pancratic  . Heart attack Mother   . Cancer Father        stomach  . Diabetes Father   . Hyperlipidemia Father   . Heart disease Father        NOT  before age 62  . Cancer Sister        lung  . Hyperlipidemia Sister   . Cancer Brother        thyroid, prostate  . Hyperlipidemia Brother   . Heart attack Brother   . Cancer Sister        colon  . Hyperlipidemia Sister   . Cancer Sister        colon  . Hyperlipidemia Sister     Allergies  Allergies  Allergen Reactions  . Tetanus Toxoids Swelling     Current Outpatient Medications  Medication Sig Dispense Refill  . aspirin EC 81 MG tablet Take 81 mg by mouth daily.    . cholecalciferol (VITAMIN D) 1000 UNITS tablet Take 1,000 Units by mouth daily.    . fluticasone (FLONASE) 50 MCG/ACT nasal spray Place 2 sprays into both nostrils daily. 16 g 6  . hydrochlorothiazide (HYDRODIURIL) 25 MG tablet TAKE 1 TABLET BY MOUTH EVERY DAY 90 tablet 2  . losartan (COZAAR) 100 MG tablet  TAKE 1 TABLET(100 MG) BY MOUTH DAILY 90 tablet 2   No current facility-administered medications for this visit.    ROS:   General:  14 lbs weight loss last in the month, Fever, chills  HEENT: No recent headaches, no nasal bleeding, no visual changes, no sore throat  Neurologic: No dizziness, blackouts, seizures. No recent symptoms of stroke or mini- stroke. No recent episodes of slurred speech, or temporary blindness.  Cardiac: No recent episodes of chest pain/pressure, no shortness of breath at rest.  No shortness of breath with exertion.  Denies history of atrial fibrillation or irregular heartbeat  Vascular: No history of rest pain in feet.  No history of claudication.  No history of non-healing ulcer, No history of DVT   Pulmonary: No home oxygen, no productive cough, no hemoptysis,  No asthma or wheezing  Musculoskeletal:  [ ]  Arthritis, [ ]  Low back pain,  [ ]  Joint pain  Hematologic:No history of  hypercoagulable state.  No history of easy bleeding.  No history of anemia  Gastrointestinal: No hematochezia or melena,  No gastroesophageal reflux, no trouble swallowing  Urinary: [ ]  chronic Kidney disease, [ ]  on HD - [ ]  MWF or [ ]  TTHS, [ ]  Burning with urination, [ ]  Frequent urination, [ ]  Difficulty urinating;   Skin: No rashes  Psychological: No history of anxiety,  No history of depression   Physical Examination  Vitals:   09/04/20 1029 09/04/20 1031  BP: (!) 149/73 (!) 150/70  Pulse: 65   Resp: 20   Temp: 98.1 F (36.7 C)   TempSrc: Temporal   SpO2: 97%   Weight: 147 lb 1.6 oz (66.7 kg)   Height: 5\' 3"  (1.6 m)     Body mass index is 26.06 kg/m.  General:  Alert and oriented, no acute distress HEENT: Normal Neck: No bruit or JVD Pulmonary: Clear to auscultation bilaterally Cardiac: Regular Rate and Rhythm without murmur Gastrointestinal: Soft, non-tender, non-distended, no mass, no scars Skin: No rash Extremity Pulses:  2+ radial, brachial, femoral, dorsalis pedis, posterior tibial pulses bilaterally Musculoskeletal: No deformity or edema  Neurologic: Upper and lower extremity motor 5/5 and symmetric  DATA:    Right Carotid Findings:  +----------+--------+--------+--------+-------------------------+--------+       PSV cm/sEDV cm/sStenosisPlaque Description    Comments  +----------+--------+--------+--------+-------------------------+--------+  CCA Prox 74   8                           +----------+--------+--------+--------+-------------------------+--------+  CCA Mid  78   14                          +----------+--------+--------+--------+-------------------------+--------+  CCA Distal72   16       calcific and heterogenous      +----------+--------+--------+--------+-------------------------+--------+  ICA Prox 145   24       calcific                +----------+--------+--------+--------+-------------------------+--------+  ICA Mid  130   28                          +----------+--------+--------+--------+-------------------------+--------+  ICA Distal145   29                          +----------+--------+--------+--------+-------------------------+--------+  ECA    292   19   >50%  heterogenous             +----------+--------+--------+--------+-------------------------+--------+   +----------+--------+-------+----------------+-------------------+  PSV cm/sEDV cmsDescribe    Arm Pressure (mmHG)  +----------+--------+-------+----------------+-------------------+  Subclavian119   15   Multiphasic, WNL            +----------+--------+-------+----------------+-------------------+   +---------+--------+--+--------+--+---------+  VertebralPSV cm/s70EDV cm/s11Antegrade  +---------+--------+--+--------+--+---------+      Left Carotid Findings:  +----------+--------+--------+--------+-------------------------+--------+       PSV cm/sEDV cm/sStenosisPlaque Description    Comments  +----------+--------+--------+--------+-------------------------+--------+  CCA Prox 94   13                          +----------+--------+--------+--------+-------------------------+--------+  CCA Mid  86   13       heterogenous             +----------+--------+--------+--------+-------------------------+--------+  CCA Distal78   14                          +----------+--------+--------+--------+-------------------------+--------+  ICA Prox 213   42       heterogenous and calcific      +----------+--------+--------+--------+-------------------------+--------+  ICA Mid   189   31                          +----------+--------+--------+--------+-------------------------+--------+  ICA Distal125   30                          +----------+--------+--------+--------+-------------------------+--------+  ECA    226   20   >50%                     +----------+--------+--------+--------+-------------------------+--------+   +----------+--------+--------+----------------+-------------------+       PSV cm/sEDV cm/sDescribe    Arm Pressure (mmHG)  +----------+--------+--------+----------------+-------------------+  [id number]   2    Multiphasic, WNL            +----------+--------+--------+----------------+-------------------+   +---------+--------+--+--------+--+---------+  VertebralPSV cm/s70EDV cm/s12Antegrade  +---------+--------+--+--------+--+---------+        Summary:  Right Carotid: Velocities in the right ICA are consistent with a 1-39%  stenosis.         The ECA appears >50% stenosed.   Left Carotid: Velocities in the left ICA are consistent with a 40-59%  stenosis.        The ECA appears >50% stenosed.   Vertebrals: Bilateral vertebral arteries demonstrate antegrade flow.  Subclavians: Normal flow hemodynamics were seen in bilateral subclavian        arteries.   ASSESSMENT:  Asymptomatic Carotid stenosis The duplex shows no significant changes in stenosis.  Left < 59% and right < 39%.    PLAN:  I advised her to change her routine.  She continues to work 4 days a week, but when she comes home she doesn't feel like doing anything else.  I suggested started a walking program.  As far as her nutrition she should try a supplement to increase her calorie intake.  Possible she is depressed?  She has palpable pedal and UE pulses, no edema and no external source of trauma.  I am hoping  that a change in her life style will help with her sudden leg and arm discomfort.  F/U in 1 year for repeat carotid duplex.  If she develops symptoms of stroke she will call 911.    Roxy Horseman PA-C [facility] Office: [phone]  MD in clinic [place]

## 2020-09-14 DIAGNOSIS — I779 Disorder of arteries and arterioles, unspecified: Secondary | ICD-10-CM | POA: Diagnosis not present

## 2020-09-14 DIAGNOSIS — H6981 Other specified disorders of Eustachian tube, right ear: Secondary | ICD-10-CM | POA: Diagnosis not present

## 2020-09-14 DIAGNOSIS — I2510 Atherosclerotic heart disease of native coronary artery without angina pectoris: Secondary | ICD-10-CM | POA: Diagnosis not present

## 2020-09-14 DIAGNOSIS — I10 Essential (primary) hypertension: Secondary | ICD-10-CM | POA: Diagnosis not present

## 2020-09-14 DIAGNOSIS — Z299 Encounter for prophylactic measures, unspecified: Secondary | ICD-10-CM | POA: Diagnosis not present

## 2021-01-03 DIAGNOSIS — I700 Atherosclerosis of aorta: Secondary | ICD-10-CM | POA: Diagnosis not present

## 2021-01-03 DIAGNOSIS — Z299 Encounter for prophylactic measures, unspecified: Secondary | ICD-10-CM | POA: Diagnosis not present

## 2021-01-03 DIAGNOSIS — I10 Essential (primary) hypertension: Secondary | ICD-10-CM | POA: Diagnosis not present

## 2021-01-03 DIAGNOSIS — J302 Other seasonal allergic rhinitis: Secondary | ICD-10-CM | POA: Diagnosis not present

## 2021-01-21 DIAGNOSIS — I10 Essential (primary) hypertension: Secondary | ICD-10-CM | POA: Diagnosis not present

## 2021-02-18 DIAGNOSIS — I10 Essential (primary) hypertension: Secondary | ICD-10-CM | POA: Diagnosis not present

## 2021-03-01 ENCOUNTER — Other Ambulatory Visit (HOSPITAL_COMMUNITY): Payer: Self-pay | Admitting: Internal Medicine

## 2021-03-01 DIAGNOSIS — Z1231 Encounter for screening mammogram for malignant neoplasm of breast: Secondary | ICD-10-CM

## 2021-03-11 DIAGNOSIS — G5791 Unspecified mononeuropathy of right lower limb: Secondary | ICD-10-CM | POA: Diagnosis not present

## 2021-03-11 DIAGNOSIS — M898X7 Other specified disorders of bone, ankle and foot: Secondary | ICD-10-CM | POA: Diagnosis not present

## 2021-03-11 DIAGNOSIS — M79671 Pain in right foot: Secondary | ICD-10-CM | POA: Diagnosis not present

## 2021-03-13 ENCOUNTER — Ambulatory Visit (HOSPITAL_COMMUNITY)
Admission: RE | Admit: 2021-03-13 | Discharge: 2021-03-13 | Disposition: A | Discharge status: Home / Self Care | Payer: Medicare Other | Source: Ambulatory Visit | Attending: Internal Medicine | Admitting: Internal Medicine

## 2021-03-13 ENCOUNTER — Other Ambulatory Visit: Payer: Self-pay

## 2021-03-13 DIAGNOSIS — Z1231 Encounter for screening mammogram for malignant neoplasm of breast: Secondary | ICD-10-CM | POA: Insufficient documentation

## 2021-03-15 DIAGNOSIS — I2510 Atherosclerotic heart disease of native coronary artery without angina pectoris: Secondary | ICD-10-CM | POA: Diagnosis not present

## 2021-03-15 DIAGNOSIS — I700 Atherosclerosis of aorta: Secondary | ICD-10-CM | POA: Diagnosis not present

## 2021-03-15 DIAGNOSIS — I10 Essential (primary) hypertension: Secondary | ICD-10-CM | POA: Diagnosis not present

## 2021-03-15 DIAGNOSIS — I779 Disorder of arteries and arterioles, unspecified: Secondary | ICD-10-CM | POA: Diagnosis not present

## 2021-03-15 DIAGNOSIS — Z87891 Personal history of nicotine dependence: Secondary | ICD-10-CM | POA: Diagnosis not present

## 2021-03-15 DIAGNOSIS — Z299 Encounter for prophylactic measures, unspecified: Secondary | ICD-10-CM | POA: Diagnosis not present

## 2021-03-21 DIAGNOSIS — I10 Essential (primary) hypertension: Secondary | ICD-10-CM | POA: Diagnosis not present

## 2021-04-19 DIAGNOSIS — I10 Essential (primary) hypertension: Secondary | ICD-10-CM | POA: Diagnosis not present

## 2021-05-07 ENCOUNTER — Telehealth: Payer: Self-pay | Admitting: Family Medicine

## 2021-05-07 NOTE — Telephone Encounter (Signed)
No answer unable to leave a  message for patient to call back and schedule Medicare Annual Wellness Visit (AWV) in office.   If not able to come in office, please offer to do virtually or by telephone.   Last AWV: 06/12/2017   Please schedule at anytime with [facility]-Nurse Health Advisor.  If any questions, please contact me at [phone]

## 2021-05-21 DIAGNOSIS — I10 Essential (primary) hypertension: Secondary | ICD-10-CM | POA: Diagnosis not present

## 2021-05-23 ENCOUNTER — Other Ambulatory Visit: Payer: Self-pay

## 2021-05-23 DIAGNOSIS — I6529 Occlusion and stenosis of unspecified carotid artery: Secondary | ICD-10-CM

## 2021-05-24 DIAGNOSIS — Z6827 Body mass index (BMI) 27.0-27.9, adult: Secondary | ICD-10-CM | POA: Diagnosis not present

## 2021-05-24 DIAGNOSIS — Z0000 Encounter for general adult medical examination without abnormal findings: Secondary | ICD-10-CM | POA: Diagnosis not present

## 2021-05-24 DIAGNOSIS — I779 Disorder of arteries and arterioles, unspecified: Secondary | ICD-10-CM | POA: Diagnosis not present

## 2021-05-24 DIAGNOSIS — Z1339 Encounter for screening examination for other mental health and behavioral disorders: Secondary | ICD-10-CM | POA: Diagnosis not present

## 2021-05-24 DIAGNOSIS — R5383 Other fatigue: Secondary | ICD-10-CM | POA: Diagnosis not present

## 2021-05-24 DIAGNOSIS — Z299 Encounter for prophylactic measures, unspecified: Secondary | ICD-10-CM | POA: Diagnosis not present

## 2021-05-24 DIAGNOSIS — Z1331 Encounter for screening for depression: Secondary | ICD-10-CM | POA: Diagnosis not present

## 2021-05-24 DIAGNOSIS — I10 Essential (primary) hypertension: Secondary | ICD-10-CM | POA: Diagnosis not present

## 2021-05-24 DIAGNOSIS — Z87891 Personal history of nicotine dependence: Secondary | ICD-10-CM | POA: Diagnosis not present

## 2021-05-24 DIAGNOSIS — Z79899 Other long term (current) drug therapy: Secondary | ICD-10-CM | POA: Diagnosis not present

## 2021-05-24 DIAGNOSIS — Z7189 Other specified counseling: Secondary | ICD-10-CM | POA: Diagnosis not present

## 2021-06-20 DIAGNOSIS — I10 Essential (primary) hypertension: Secondary | ICD-10-CM | POA: Diagnosis not present

## 2021-07-15 ENCOUNTER — Encounter: Payer: Self-pay | Admitting: *Deleted

## 2021-07-16 DIAGNOSIS — I779 Disorder of arteries and arterioles, unspecified: Secondary | ICD-10-CM | POA: Diagnosis not present

## 2021-07-16 DIAGNOSIS — U071 COVID-19: Secondary | ICD-10-CM | POA: Diagnosis not present

## 2021-07-16 DIAGNOSIS — I700 Atherosclerosis of aorta: Secondary | ICD-10-CM | POA: Diagnosis not present

## 2021-07-16 DIAGNOSIS — Z299 Encounter for prophylactic measures, unspecified: Secondary | ICD-10-CM | POA: Diagnosis not present

## 2021-07-19 DIAGNOSIS — I10 Essential (primary) hypertension: Secondary | ICD-10-CM | POA: Diagnosis not present

## 2021-07-26 ENCOUNTER — Other Ambulatory Visit: Payer: Self-pay

## 2021-07-26 ENCOUNTER — Other Ambulatory Visit (HOSPITAL_COMMUNITY): Payer: Self-pay | Admitting: Student

## 2021-07-26 ENCOUNTER — Ambulatory Visit (HOSPITAL_COMMUNITY)
Admission: RE | Admit: 2021-07-26 | Discharge: 2021-07-26 | Disposition: A | Discharge status: Home / Self Care | Payer: Medicare Other | Source: Ambulatory Visit | Attending: Student | Admitting: Student

## 2021-07-26 DIAGNOSIS — R110 Nausea: Secondary | ICD-10-CM | POA: Diagnosis not present

## 2021-07-26 DIAGNOSIS — U071 COVID-19: Secondary | ICD-10-CM | POA: Diagnosis not present

## 2021-07-26 DIAGNOSIS — R051 Acute cough: Secondary | ICD-10-CM | POA: Insufficient documentation

## 2021-07-26 DIAGNOSIS — Z299 Encounter for prophylactic measures, unspecified: Secondary | ICD-10-CM | POA: Diagnosis not present

## 2021-07-26 DIAGNOSIS — R059 Cough, unspecified: Secondary | ICD-10-CM | POA: Diagnosis not present

## 2021-07-26 DIAGNOSIS — Z87891 Personal history of nicotine dependence: Secondary | ICD-10-CM | POA: Diagnosis not present

## 2021-08-21 ENCOUNTER — Other Ambulatory Visit (HOSPITAL_COMMUNITY): Payer: Self-pay | Admitting: Student

## 2021-08-21 ENCOUNTER — Ambulatory Visit (HOSPITAL_COMMUNITY)
Admission: RE | Admit: 2021-08-21 | Discharge: 2021-08-21 | Disposition: A | Discharge status: Home / Self Care | Payer: Medicare Other | Source: Ambulatory Visit | Attending: Student | Admitting: Student

## 2021-08-21 ENCOUNTER — Other Ambulatory Visit: Payer: Self-pay

## 2021-08-21 DIAGNOSIS — M79661 Pain in right lower leg: Secondary | ICD-10-CM | POA: Diagnosis not present

## 2021-08-21 DIAGNOSIS — M79662 Pain in left lower leg: Secondary | ICD-10-CM | POA: Diagnosis not present

## 2021-08-21 DIAGNOSIS — R051 Acute cough: Secondary | ICD-10-CM

## 2021-08-21 DIAGNOSIS — R059 Cough, unspecified: Secondary | ICD-10-CM | POA: Diagnosis not present

## 2021-08-21 DIAGNOSIS — I10 Essential (primary) hypertension: Secondary | ICD-10-CM | POA: Diagnosis not present

## 2021-08-21 DIAGNOSIS — Z299 Encounter for prophylactic measures, unspecified: Secondary | ICD-10-CM | POA: Diagnosis not present

## 2021-08-23 ENCOUNTER — Ambulatory Visit
Admission: EM | Admit: 2021-08-23 | Discharge: 2021-08-23 | Disposition: A | Discharge status: Home / Self Care | Payer: Medicare Other

## 2021-08-23 ENCOUNTER — Encounter (HOSPITAL_COMMUNITY): Payer: Self-pay

## 2021-08-23 ENCOUNTER — Emergency Department (HOSPITAL_COMMUNITY)
Admission: EM | Admit: 2021-08-23 | Discharge: 2021-08-23 | Disposition: A | Discharge status: Home / Self Care | Payer: Medicare Other | Attending: Emergency Medicine | Admitting: Emergency Medicine

## 2021-08-23 ENCOUNTER — Encounter: Payer: Self-pay | Admitting: Emergency Medicine

## 2021-08-23 ENCOUNTER — Other Ambulatory Visit: Payer: Self-pay

## 2021-08-23 ENCOUNTER — Emergency Department (HOSPITAL_COMMUNITY): Payer: Medicare Other

## 2021-08-23 DIAGNOSIS — Z7982 Long term (current) use of aspirin: Secondary | ICD-10-CM | POA: Diagnosis not present

## 2021-08-23 DIAGNOSIS — Z87891 Personal history of nicotine dependence: Secondary | ICD-10-CM | POA: Insufficient documentation

## 2021-08-23 DIAGNOSIS — I10 Essential (primary) hypertension: Secondary | ICD-10-CM | POA: Insufficient documentation

## 2021-08-23 DIAGNOSIS — L03115 Cellulitis of right lower limb: Secondary | ICD-10-CM | POA: Diagnosis not present

## 2021-08-23 DIAGNOSIS — M79661 Pain in right lower leg: Secondary | ICD-10-CM | POA: Diagnosis not present

## 2021-08-23 DIAGNOSIS — Z79899 Other long term (current) drug therapy: Secondary | ICD-10-CM | POA: Insufficient documentation

## 2021-08-23 DIAGNOSIS — R0989 Other specified symptoms and signs involving the circulatory and respiratory systems: Secondary | ICD-10-CM

## 2021-08-23 DIAGNOSIS — M79662 Pain in left lower leg: Secondary | ICD-10-CM | POA: Diagnosis not present

## 2021-08-23 MED ORDER — CEPHALEXIN 500 MG PO CAPS: 500.0000 mg | ORAL_CAPSULE | Freq: Two times a day (BID) | ORAL | 0 refills | Status: AC

## 2021-08-23 MED ORDER — CEPHALEXIN 500 MG PO CAPS: 500.0000 mg | ORAL_CAPSULE | Freq: Two times a day (BID) | ORAL | 0 refills | Status: DC

## 2021-08-23 NOTE — ED Notes (Signed)
Patient transported to Ultrasound 

## 2021-08-23 NOTE — ED Provider Notes (Signed)
[facility] EMERGENCY DEPARTMENT Provider Note   CSN: [id number] Arrival date & time: 08/23/21  1409     History Chief Complaint  Patient presents with   Leg Pain    Stacey Moody is a 75 y.o. female with no significant past medical history who presents for evaluation of leg pain.  States she has been having issues since she was diagnosed with COVID in November.  Has bilateral pain however worse to right lower extremity.  She feels like her leg has been red and warm.  She has pain to her posterior calf.  She has no prior history of PE or DVT.  She denies any chest pain or shortness of breath.  No recent injuries.  She feels like her right lower extremity is swelling.  Denies fever, chills, nausea, vomiting abdominal pain, numbness or tingling.  Denies additional aggravating or alleviating factors.  Patient does not anything for pain at this time.  She rates her pain a 5/10.  History obtained from patient and past medical records.  No interpreter used    HPI     Past Medical History:  Diagnosis Date   Carotid artery stenosis    GERD (gastroesophageal reflux disease)    Hyperlipidemia    Hypertension    Influenza A 01/01/2014   Osteopenia    T-2.3   Right renal artery stenosis (HCC)    Syncope 12/31/2013   Vasovagal or hypovolemia/orthostatic    Patient Active Problem List   Diagnosis Date Noted   Unstable angina ([place])    Chest pain 01/07/2020   Right renal artery stenosis ([place])    Osteopenia    Abdominal pain    Legionella infection ([place])    Gastroenteritis 12/31/2014   Elevated transaminase level 12/31/2014   CAP (community acquired pneumonia) 12/31/2014   Nausea vomiting and diarrhea 12/30/2014   Hypokalemia 12/30/2014   Hyponatremia 12/30/2014   Dehydration 12/30/2014   Fever 12/30/2014   Other pancytopenia ([place]) 01/03/2014   Carotid artery stenosis 01/03/2014   Vertigo 01/02/2014   Right otitis media 01/02/2014   Leukopenia 01/02/2014   Influenza A 01/01/2014    Scalp hematoma 01/01/2014   Syncope 12/31/2013   Diarrhea 12/31/2013   URI (upper respiratory infection) 12/31/2013   Hypertension    Hyperlipidemia    GERD (gastroesophageal reflux disease)    Occlusion and stenosis of carotid artery without mention of cerebral infarction 06/22/2012    Past Surgical History:  Procedure Laterality Date   ABDOMINAL HYSTERECTOMY  1982   Partial hysterectomy   COLONOSCOPY N/A 07/30/2016   Procedure: COLONOSCOPY;  Surgeon: Daneil Dolin, MD;  [facility];  Service: Endoscopy;  Laterality: N/A;  8:30 AM   CT SCAN  July 2015   Chest   LEFT HEART CATH AND CORONARY ANGIOGRAPHY N/A 01/09/2020   Procedure: LEFT HEART CATH AND CORONARY ANGIOGRAPHY;  Surgeon: Nelva Bush, MD;  Location: [place] [facility];  Service: Cardiovascular;  Laterality: N/A;   POLYPECTOMY  07/30/2016   Procedure: POLYPECTOMY;  Surgeon: Daneil Dolin, MD;  Location: [facility];  Service: Endoscopy;;  descending colon   SMALL INTESTINE SURGERY       OB History   No obstetric history on file.     Family History  Problem Relation Age of Onset   Cancer Mother        pancratic   Heart attack Mother    Cancer Father        stomach   Diabetes Father  Hyperlipidemia Father    Heart disease Father        NOT  before age 28   Cancer Sister        lung   Hyperlipidemia Sister    Cancer Brother        thyroid, prostate   Hyperlipidemia Brother    Heart attack Brother    Cancer Sister        colon   Hyperlipidemia Sister    Cancer Sister        colon   Hyperlipidemia Sister     Social History   Tobacco Use   Smoking status: Former    Types: Cigarettes    Quit date: 12/22/1996    Years since quitting: 24.6   Smokeless tobacco: Never  Vaping Use   Vaping Use: Never used  Substance Use Topics   Alcohol use: No   Drug use: No    Home Medications Prior to Admission medications   Medication Sig Start Date End Date Taking? Authorizing Provider   aspirin EC 81 MG tablet Take 81 mg by mouth daily.    [provider]  cephALEXin (KEFLEX) 500 MG capsule Take 1 capsule (500 mg total) by mouth 2 (two) times daily for 7 days. 08/23/21 08/30/21  Long, Wonda Olds, MD  cholecalciferol (VITAMIN D) 1000 UNITS tablet Take 1,000 Units by mouth daily.    [provider]  fluticasone (FLONASE) 50 MCG/ACT nasal spray Place 2 sprays into both nostrils daily. 01/09/20   Antonieta Pert, MD  hydrochlorothiazide (HYDRODIURIL) 25 MG tablet TAKE 1 TABLET BY MOUTH EVERY DAY 06/15/20   Alycia Rossetti, MD  losartan (COZAAR) 100 MG tablet TAKE 1 TABLET(100 MG) BY MOUTH DAILY 06/15/20   Alycia Rossetti, MD    Allergies    Tetanus toxoids  Review of Systems   Review of Systems  Constitutional: Negative.   HENT: Negative.    Respiratory: Negative.    Cardiovascular: Negative.   Gastrointestinal: Negative.   Genitourinary: Negative.   Musculoskeletal:        Right LE pain  Skin: Negative.   Neurological: Negative.   All other systems reviewed and are negative.  Physical Exam Updated Vital Signs BP (!) 162/62   Pulse 80   Temp 98.3 F (36.8 C) (Temporal)   Resp 18   Ht '5\' 3"'$  (1.6 m)   Wt 68.5 kg   SpO2 99%   BMI 26.75 kg/m   Physical Exam Vitals and nursing note reviewed.  Constitutional:      General: She is not in acute distress.    Appearance: She is well-developed. She is not ill-appearing, toxic-appearing or diaphoretic.  HENT:     Head: Normocephalic and atraumatic.     Nose: Nose normal.     Mouth/Throat:     Mouth: Mucous membranes are moist.  Eyes:     Pupils: Pupils are equal, round, and reactive to light.  Cardiovascular:     Rate and Rhythm: Normal rate.     Pulses:          Dorsalis pedis pulses are 1+ on the right side and 1+ on the left side.       Posterior tibial pulses are 2+ on the right side and 2+ on the left side.  Pulmonary:     Effort: Pulmonary effort is normal. No respiratory distress.   Abdominal:     General: There is no distension.  Musculoskeletal:        General: Normal  range of motion.     Cervical back: Normal range of motion.     Comments: Diffuse tenderness to right lower extremity at the calf.  There is medial anterior erythema with some mild warmth.  Good cap refill bilateral lower extremities  Skin:    General: Skin is warm and dry.     Capillary Refill: Capillary refill takes less than 2 seconds.          Comments: Erythema to mid anterior right tib-fib.  Some overlying warmth.  No skin breakdown, ulcerations, abrasions  Neurological:     General: No focal deficit present.     Mental Status: She is alert.  Psychiatric:        Mood and Affect: Mood normal.    ED Results / Procedures / Treatments   Labs (all labs ordered are listed, but only abnormal results are displayed) Labs Reviewed - No data to display  EKG None  Radiology US Venous Img Lower Bilateral  Result Date: 08/23/2021 CLINICAL DATA:  Bilateral calf pain, right greater than left. Evaluate for DVT. EXAM: BILATERAL LOWER EXTREMITY VENOUS DOPPLER ULTRASOUND TECHNIQUE: Gray-scale sonography with graded compression, as well as color Doppler and duplex ultrasound were performed to evaluate the lower extremity deep venous systems from the level of the common femoral vein and including the common femoral, femoral, profunda femoral, popliteal and calf veins including the posterior tibial, peroneal and gastrocnemius veins when visible. The superficial great saphenous vein was also interrogated. Spectral Doppler was utilized to evaluate flow at rest and with distal augmentation maneuvers in the common femoral, femoral and popliteal veins. COMPARISON:  None. FINDINGS: RIGHT LOWER EXTREMITY Common Femoral Vein: No evidence of thrombus. Normal compressibility, respiratory phasicity and response to augmentation. Saphenofemoral Junction: No evidence of thrombus. Normal compressibility and flow on color Doppler  imaging. Profunda Femoral Vein: No evidence of thrombus. Normal compressibility and flow on color Doppler imaging. Femoral Vein: No evidence of thrombus. Normal compressibility, respiratory phasicity and response to augmentation. Popliteal Vein: No evidence of thrombus. Normal compressibility, respiratory phasicity and response to augmentation. Calf Veins: No evidence of thrombus. Normal compressibility and flow on color Doppler imaging. Superficial Great Saphenous Vein: No evidence of thrombus. Normal compressibility. Venous Reflux:  None. Other Findings:  None. LEFT LOWER EXTREMITY Common Femoral Vein: No evidence of thrombus. Normal compressibility, respiratory phasicity and response to augmentation. Saphenofemoral Junction: No evidence of thrombus. Normal compressibility and flow on color Doppler imaging. Profunda Femoral Vein: No evidence of thrombus. Normal compressibility and flow on color Doppler imaging. Femoral Vein: No evidence of thrombus. Normal compressibility, respiratory phasicity and response to augmentation. Popliteal Vein: No evidence of thrombus. Normal compressibility, respiratory phasicity and response to augmentation. Calf Veins: No evidence of thrombus. Normal compressibility and flow on color Doppler imaging. Superficial Great Saphenous Vein: No evidence of thrombus. Normal compressibility. Venous Reflux:  None. Other Findings:  None. IMPRESSION: No evidence of DVT within either lower extremity. Electronically Signed   By: Sandi Mariscal M.D.   On: 08/23/2021 16:45    Procedures Procedures   Medications Ordered in ED Medications - No data to display  ED Course  I have reviewed the triage vital signs and the nursing notes.  Pertinent labs & imaging results that were available during my care of the patient were reviewed by me and considered in my medical decision making (see chart for details).  Here for evaluation of right lower extremity pain began approximately 1 week ago.  She is  afebrile, nonseptic,  not ill-appearing. Per Urgent care note was seen by PCP was supposed to have ultrasound scheduled however patient has not heard back for this.  When was seen at urgent care had difficulty palpating right lower extremity pulses subsequently sent here for ultrasound and reevaluation.  [place] negative for DVT.  Patient exam likely consistent with cellulitis.  She is neurovascularly intact.  No bony tenderness.  Full range of motion to joints.  I low suspicion for gout, septic joint, occult fracture, hemarthrosis, limb ischemia.  No chest pain, shortness of breath to suggest PE.  Discussed close follow-up with PCP.  She is agreeable.  Given Rx for likely cellulitis  The patient has been appropriately medically screened and/or stabilized in the ED. I have low suspicion for any other emergent medical condition which would require further screening, evaluation or treatment in the ED or require inpatient management.  Patient is hemodynamically stable and in no acute distress.  Patient able to ambulate in department prior to ED.  Evaluation does not show acute pathology that would require ongoing or additional emergent interventions while in the emergency department or further inpatient treatment.  I have discussed the diagnosis with the patient and answered all questions.  Pain is been managed while in the emergency department and patient has no further complaints prior to discharge.  Patient is comfortable with plan discussed in room and is stable for discharge at this time.  I have discussed strict return precautions for returning to the emergency department.  Patient was encouraged to follow-up with PCP/specialist refer to at discharge.   Patient seen eval by attending, Dr. Laverta Baltimore who agrees above treatment, plan and disposition    MDM Rules/Calculators/A&P                            Final Clinical Impression(s) / ED Diagnoses Final diagnoses:  Cellulitis of right lower extremity     Rx / DC Orders ED Discharge Orders          Ordered    cephALEXin (KEFLEX) 500 MG capsule  2 times daily,   Status:  Discontinued        08/23/21 1740    cephALEXin (KEFLEX) 500 MG capsule  2 times daily,   Status:  Discontinued        08/23/21 1836    cephALEXin (KEFLEX) 500 MG capsule  2 times daily        08/23/21 1837             Girtie Wiersma A, PA-C 08/23/21 1842    Long, Wonda Olds, MD 08/27/21 1726

## 2021-08-23 NOTE — ED Triage Notes (Signed)
Pt presents to ED with right lower leg pain started Monday, and swelling around right ankle. Sent by UC to rule out DVT

## 2021-08-23 NOTE — ED Provider Notes (Signed)
[facility]   [id number] 08/23/21 Arrival Time: [id number]  CC: RLE pain  SUBJECTIVE: History from: patient. Stacey Moody is a 75 y.o. female complains of RT calf pain and swelling x 4 days.  Denies a precipitating event or specific injury.  Localizes the pain to the calf.  Describes the pain as intermittent and sharp in character.  Has tried OTC tylenol with minimal relief.  Symptoms are made worse with walking.  Denies similar symptoms in the past.  Denies fever, chills, erythema, ecchymosis.    Denies SOB, recent long travel, recent surgery, hormone use, tobacco use, malignancy, hx of blood clot.        PCP was suppose to order [place] to rule out blood clot earlier this week, but patient has not heard from them.    ROS: As per HPI.  All other pertinent ROS negative.     Past Medical History:  Diagnosis Date   Carotid artery stenosis    GERD (gastroesophageal reflux disease)    Hyperlipidemia    Hypertension    Influenza A 01/01/2014   Osteopenia    T-2.3   Right renal artery stenosis (HCC)    Syncope 12/31/2013   Vasovagal or hypovolemia/orthostatic   Past Surgical History:  Procedure Laterality Date   ABDOMINAL HYSTERECTOMY  1982   Partial hysterectomy   COLONOSCOPY N/A 07/30/2016   Procedure: COLONOSCOPY;  Surgeon: Daneil Dolin, MD;  [facility];  Service: Endoscopy;  Laterality: N/A;  8:30 AM   CT SCAN  July 2015   Chest   LEFT HEART CATH AND CORONARY ANGIOGRAPHY N/A 01/09/2020   Procedure: LEFT HEART CATH AND CORONARY ANGIOGRAPHY;  Surgeon: Nelva Bush, MD;  Location: [place] [facility];  Service: Cardiovascular;  Laterality: N/A;   POLYPECTOMY  07/30/2016   Procedure: POLYPECTOMY;  Surgeon: Daneil Dolin, MD;  [facility];  Service: Endoscopy;;  descending colon   SMALL INTESTINE SURGERY     Allergies  Allergen Reactions   Tetanus Toxoids Swelling   No current facility-administered medications on file prior to encounter.   Current  Outpatient Medications on File Prior to Encounter  Medication Sig Dispense Refill   aspirin EC 81 MG tablet Take 81 mg by mouth daily.     cholecalciferol (VITAMIN D) 1000 UNITS tablet Take 1,000 Units by mouth daily.     fluticasone (FLONASE) 50 MCG/ACT nasal spray Place 2 sprays into both nostrils daily. 16 g 6   hydrochlorothiazide (HYDRODIURIL) 25 MG tablet TAKE 1 TABLET BY MOUTH EVERY DAY 90 tablet 2   losartan (COZAAR) 100 MG tablet TAKE 1 TABLET(100 MG) BY MOUTH DAILY 90 tablet 2   Social History   Socioeconomic History   Marital status: Widowed    Spouse name: Not on file   Number of children: Not on file   Years of education: Not on file   Highest education level: Not on file  Occupational History   Not on file  Tobacco Use   Smoking status: Former    Types: Cigarettes    Quit date: 12/22/1996    Years since quitting: 24.6   Smokeless tobacco: Never  Vaping Use   Vaping Use: Never used  Substance and Sexual Activity   Alcohol use: No   Drug use: No   Sexual activity: Not on file  Other Topics Concern   Not on file  Social History Narrative   Not on file   Social Determinants of Health   Financial  Resource Strain: Not on file  Food Insecurity: Not on file  Transportation Needs: Not on file  Physical Activity: Not on file  Stress: Not on file  Social Connections: Not on file  Intimate Partner Violence: Not on file   Family History  Problem Relation Age of Onset   Cancer Mother        pancratic   Heart attack Mother    Cancer Father        stomach   Diabetes Father    Hyperlipidemia Father    Heart disease Father        NOT  before age 19   Cancer Sister        lung   Hyperlipidemia Sister    Cancer Brother        thyroid, prostate   Hyperlipidemia Brother    Heart attack Brother    Cancer Sister        colon   Hyperlipidemia Sister    Cancer Sister        colon   Hyperlipidemia Sister     OBJECTIVE:  Vitals:   08/23/21 1314  BP: (!)  172/63  Pulse: 71  Resp: 17  Temp: 98.3 F (36.8 C)  TempSrc: Oral  SpO2: 95%    General appearance: ALERT; in no acute distress.  Head: NCAT Lungs: Normal respiratory effort CV: decreased dorsalis pedis pulse on RT; 2+ on LT; cap refill intact bilaterally Musculoskeletal: RLE Inspection: mild diffuse swelling, some sore to posterior shin Palpation: TTP over posterior calf; +homan's Skin: warm and dry Neurologic: Ambulates without difficulty Psychological: alert and cooperative; normal mood and affect   ASSESSMENT & PLAN:  1. Right calf pain   2. Decreased dorsalis pedis pulse     Cannot rule out blood clot in urgent care setting.  Recommending further evaluation and management in the ED to rule out blood clot.  Patient aware and in agreement with plan.    Reviewed expectations re: course of current medical issues. Questions answered. Outlined signs and symptoms indicating need for more acute intervention. Patient verbalized understanding. After Visit Summary given.     Lestine Box, PA-C 08/23/21 1345

## 2021-08-23 NOTE — Discharge Instructions (Addendum)
Take the antibiotics as prescribed.  Follow-up with primary care provider early next week.  Return for new or worsening symptoms

## 2021-08-23 NOTE — Discharge Instructions (Addendum)
Cannot rule out blood clot in urgent care setting.  Recommending further evaluation and management in the ED to rule out blood clot.  Patient aware and in agreement with plan.

## 2021-08-23 NOTE — ED Notes (Signed)
Patient is being discharged from the Urgent Care and sent to the Emergency Department via pov . Per [place], [place], patient is in need of higher level of care due to r/o dvt. Patient is aware and verbalizes understanding of plan of care.  Vitals:   08/23/21 1314  BP: (!) 172/63  Pulse: 71  Resp: 17  Temp: 98.3 F (36.8 C)  SpO2: 95%

## 2021-08-23 NOTE — ED Notes (Signed)
Pt returned from US

## 2021-08-23 NOTE — ED Triage Notes (Signed)
Pain to back of legs since Monday.  Pt reports she seen her pcp for this problem and was told she was ordering ultrasounds but never did.

## 2021-08-27 ENCOUNTER — Other Ambulatory Visit: Payer: Self-pay

## 2021-08-27 ENCOUNTER — Ambulatory Visit
Admission: EM | Admit: 2021-08-27 | Discharge: 2021-08-27 | Disposition: A | Discharge status: Home / Self Care | Payer: Medicare Other | Attending: Family Medicine | Admitting: Family Medicine

## 2021-08-27 ENCOUNTER — Encounter: Payer: Self-pay | Admitting: Emergency Medicine

## 2021-08-27 DIAGNOSIS — M79604 Pain in right leg: Secondary | ICD-10-CM

## 2021-08-27 DIAGNOSIS — M79605 Pain in left leg: Secondary | ICD-10-CM | POA: Diagnosis not present

## 2021-08-27 DIAGNOSIS — I10 Essential (primary) hypertension: Secondary | ICD-10-CM

## 2021-08-27 MED ORDER — PREDNISONE 20 MG PO TABS: 40.0000 mg | ORAL_TABLET | Freq: Every day | ORAL | 0 refills | Status: DC

## 2021-08-27 NOTE — ED Triage Notes (Signed)
Pain to bilateral calves that started last Monday. Seen in the ED , had neg dvt ultrasound.  Put on abx for cellulitis. Pt has had no relief.

## 2021-08-27 NOTE — Discharge Instructions (Addendum)
Your blood pressure was noted to be elevated during your visit today. If you are currently taking medication for high blood pressure, please ensure you are taking this as directed. If you do not have a history of high blood pressure and your blood pressure remains persistently elevated, you may need to begin taking a medication at some point. You may return here within the next few days to recheck if unable to see your primary care provider or if you do not have a one.  BP (!) 212/81 (BP Location: Right Arm) Comment: has taken 3 bp pills today  Pulse 83   Temp 98.4 F (36.9 C) (Oral)   Resp 17   SpO2 94%   BP Readings from Last 3 Encounters:  08/27/21 (!) 212/81  08/23/21 (!) 162/62  08/23/21 (!) 172/63

## 2021-08-28 NOTE — ED Provider Notes (Signed)
[place]   [id number] 08/27/21 Arrival Time: [place]:  1. Bilateral leg pain   2. Elevated blood pressure reading in office with diagnosis of hypertension    Unclear etiology of bilateral lower leg pain; sounds MSK related. No neuropathic symptoms. Discussed.  Begin short trial of: Meds ordered this encounter  Medications   predniSONE (DELTASONE) 20 MG tablet    Sig: Take 2 tablets (40 mg total) by mouth daily.    Dispense:  10 tablet    Refill:  0   WBAT. Continue  Tylenol as needed. Stop antibiotic. No signs of LE skin infection.  Recommend:  Follow-up Information     Schedule an appointment as soon as possible for a visit  with [facility].   Contact information: [place].   Specialty: Emergency Medicine Why: If symptoms worsen in any way. Contact information: [address] [id number] [place] [phone]                Reviewed expectations re: course of current medical issues. Questions answered. Outlined signs and symptoms indicating need for more acute intervention. Patient verbalized understanding. After Visit Summary given.  SUBJECTIVE: History from: patient and family. Stacey Moody is a 75 y.o. female who reports bilateral LE pain; starts in posterior ankle and goes to mid calf. Recent U/S in ED negative for DVT. Placed on antibiotic for cellulitis. No fevers. No CP/SOB. Ambulatory here. Tylenol does relief pain. History of similar: no.  Past Surgical History:  Procedure Laterality Date   ABDOMINAL HYSTERECTOMY  1982   Partial hysterectomy   COLONOSCOPY N/A 07/30/2016   Procedure: COLONOSCOPY;  Surgeon: Daneil Dolin, MD;  [facility];  Service: Endoscopy;  Laterality: N/A;  8:30 AM   CT SCAN  July 2015   Chest   LEFT  HEART CATH AND CORONARY ANGIOGRAPHY N/A 01/09/2020   Procedure: LEFT HEART CATH AND CORONARY ANGIOGRAPHY;  Surgeon: Nelva Bush, MD;  Location: [facility];  Service: Cardiovascular;  Laterality: N/A;   POLYPECTOMY  07/30/2016   Procedure: POLYPECTOMY;  Surgeon: Daneil Dolin, MD;  [facility];  Service: Endoscopy;;  descending colon   SMALL INTESTINE SURGERY        OBJECTIVE:  Vitals:   08/27/21 1951  BP: (!) 212/81  Pulse: 83  Resp: 17  Temp: 98.4 F (36.9 C)  TempSrc: Oral  SpO2: 94%    General appearance: alert; no distress HEENT: [place]; AT Neck: supple with FROM Resp: unlabored respirations Extremities: Bilateral LE: warm with well perfused appearance; poorly localized moderate tenderness over both calves and very tender over left Achilles tendon; no overlying erythema; FROM of ankle and knee; no LE edema/swelling CV: brisk extremity capillary refill of bilateral LE; 1+ DP pulse of bilateral LE. Skin: warm and dry; no visible rashes Neurologic: gait normal but slow; normal sensation and strength of bilateral LE Psychological: alert and cooperative; normal mood and affect   Allergies  Allergen Reactions   Tetanus Toxoids Swelling    Past Medical History:  Diagnosis Date   Carotid artery stenosis    GERD (gastroesophageal reflux disease)    Hyperlipidemia    Hypertension    Influenza A 01/01/2014   Osteopenia    T-2.3   Right renal artery stenosis (HCC)  Syncope 12/31/2013   Vasovagal or hypovolemia/orthostatic   Social History   Socioeconomic History   Marital status: Widowed    Spouse name: Not on file   Number of children: Not on file   Years of education: Not on file   Highest education level: Not on file  Occupational History   Not on file  Tobacco Use   Smoking status: Former    Types: Cigarettes    Quit date: 12/22/1996    Years since quitting: 24.6   Smokeless tobacco: Never  Vaping Use   Vaping Use: Never used  Substance  and Sexual Activity   Alcohol use: No   Drug use: No   Sexual activity: Not on file  Other Topics Concern   Not on file  Social History Narrative   Not on file   Social Determinants of Health   Financial Resource Strain: Not on file  Food Insecurity: Not on file  Transportation Needs: Not on file  Physical Activity: Not on file  Stress: Not on file  Social Connections: Not on file   Family History  Problem Relation Age of Onset   Cancer Mother        pancratic   Heart attack Mother    Cancer Father        stomach   Diabetes Father    Hyperlipidemia Father    Heart disease Father        NOT  before age 79   Cancer Sister        lung   Hyperlipidemia Sister    Cancer Brother        thyroid, prostate   Hyperlipidemia Brother    Heart attack Brother    Cancer Sister        colon   Hyperlipidemia Sister    Cancer Sister        colon   Hyperlipidemia Sister    Past Surgical History:  Procedure Laterality Date   ABDOMINAL HYSTERECTOMY  1982   Partial hysterectomy   COLONOSCOPY N/A 07/30/2016   Procedure: COLONOSCOPY;  Surgeon: Daneil Dolin, MD;  [facility];  Service: Endoscopy;  Laterality: N/A;  8:30 AM   CT SCAN  July 2015   Chest   LEFT HEART CATH AND CORONARY ANGIOGRAPHY N/A 01/09/2020   Procedure: LEFT HEART CATH AND CORONARY ANGIOGRAPHY;  Surgeon: Nelva Bush, MD;  Location: [facility];  Service: Cardiovascular;  Laterality: N/A;   POLYPECTOMY  07/30/2016   Procedure: POLYPECTOMY;  Surgeon: Daneil Dolin, MD;  [facility];  Service: Endoscopy;;  descending colon   SMALL INTESTINE SURGERY         Vanessa Kick, MD 08/28/21 [phone]

## 2021-08-30 DIAGNOSIS — Z713 Dietary counseling and surveillance: Secondary | ICD-10-CM | POA: Diagnosis not present

## 2021-08-30 DIAGNOSIS — Z299 Encounter for prophylactic measures, unspecified: Secondary | ICD-10-CM | POA: Diagnosis not present

## 2021-08-30 DIAGNOSIS — I10 Essential (primary) hypertension: Secondary | ICD-10-CM | POA: Diagnosis not present

## 2021-09-02 DIAGNOSIS — Z299 Encounter for prophylactic measures, unspecified: Secondary | ICD-10-CM | POA: Diagnosis not present

## 2021-09-02 DIAGNOSIS — I10 Essential (primary) hypertension: Secondary | ICD-10-CM | POA: Diagnosis not present

## 2021-09-06 ENCOUNTER — Encounter (HOSPITAL_COMMUNITY): Payer: Medicare Other

## 2021-09-06 ENCOUNTER — Ambulatory Visit: Payer: Medicare Other

## 2021-09-13 ENCOUNTER — Other Ambulatory Visit: Payer: Self-pay

## 2021-09-13 ENCOUNTER — Ambulatory Visit (INDEPENDENT_AMBULATORY_CARE_PROVIDER_SITE_OTHER): Payer: Medicare Other | Admitting: Physician Assistant

## 2021-09-13 ENCOUNTER — Ambulatory Visit (HOSPITAL_COMMUNITY)
Admission: RE | Admit: 2021-09-13 | Discharge: 2021-09-13 | Disposition: A | Discharge status: Home / Self Care | Payer: Medicare Other | Source: Ambulatory Visit | Attending: Vascular Surgery | Admitting: Vascular Surgery

## 2021-09-13 ENCOUNTER — Encounter: Payer: Self-pay | Admitting: Physician Assistant

## 2021-09-13 VITALS — BP 148/66 | HR 59 | Resp 14 | Ht 63.0 in | Wt 146.0 lb

## 2021-09-13 DIAGNOSIS — I779 Disorder of arteries and arterioles, unspecified: Secondary | ICD-10-CM | POA: Diagnosis not present

## 2021-09-13 DIAGNOSIS — I6529 Occlusion and stenosis of unspecified carotid artery: Secondary | ICD-10-CM

## 2021-09-13 DIAGNOSIS — I10 Essential (primary) hypertension: Secondary | ICD-10-CM | POA: Diagnosis not present

## 2021-09-13 DIAGNOSIS — R0989 Other specified symptoms and signs involving the circulatory and respiratory systems: Secondary | ICD-10-CM | POA: Diagnosis not present

## 2021-09-13 DIAGNOSIS — Z299 Encounter for prophylactic measures, unspecified: Secondary | ICD-10-CM | POA: Diagnosis not present

## 2021-09-13 DIAGNOSIS — I700 Atherosclerosis of aorta: Secondary | ICD-10-CM | POA: Diagnosis not present

## 2021-09-13 NOTE — Progress Notes (Signed)
History of Present Illness:  Patient is a 75 y.o. year old female who presents for evaluation of carotid stenosis.   She had been found to have a carotid bruit in the past and in 2009 underwent duplex showing moderate 50-69% stenosis bilaterally at an outlying center.  The patient denies symptoms of TIA, amaurosis, or stroke.  She is on a daily ASA.  Past Medical History:  Diagnosis Date   Carotid artery stenosis    GERD (gastroesophageal reflux disease)    Hyperlipidemia    Hypertension    Influenza A 01/01/2014   Osteopenia    T-2.3   Right renal artery stenosis (HCC)    Syncope 12/31/2013   Vasovagal or hypovolemia/orthostatic    Past Surgical History:  Procedure Laterality Date   ABDOMINAL HYSTERECTOMY  1982   Partial hysterectomy   COLONOSCOPY N/A 07/30/2016   Procedure: COLONOSCOPY;  Surgeon: Daneil Dolin, MD;  [facility];  Service: Endoscopy;  Laterality: N/A;  8:30 AM   CT SCAN  July 2015   Chest   LEFT HEART CATH AND CORONARY ANGIOGRAPHY N/A 01/09/2020   Procedure: LEFT HEART CATH AND CORONARY ANGIOGRAPHY;  Surgeon: Nelva Bush, MD;  Location: [facility];  Service: Cardiovascular;  Laterality: N/A;   POLYPECTOMY  07/30/2016   Procedure: POLYPECTOMY;  Surgeon: Daneil Dolin, MD;  [facility];  Service: Endoscopy;;  descending colon   SMALL INTESTINE SURGERY       Social History Social History   Tobacco Use   Smoking status: Former    Types: Cigarettes    Quit date: 12/22/1996    Years since quitting: 24.7   Smokeless tobacco: Never  Vaping Use   Vaping Use: Never used  Substance Use Topics   Alcohol use: No   Drug use: No    Family History Family History  Problem Relation Age of Onset   Cancer Mother        pancratic   Heart attack Mother    Cancer Father        stomach   Diabetes Father    Hyperlipidemia Father    Heart disease Father        NOT  before age 66   Cancer Sister        lung   Hyperlipidemia  Sister    Cancer Brother        thyroid, prostate   Hyperlipidemia Brother    Heart attack Brother    Cancer Sister        colon   Hyperlipidemia Sister    Cancer Sister        colon   Hyperlipidemia Sister     Allergies  Allergies  Allergen Reactions   Tetanus Toxoids Swelling     Current Outpatient Medications  Medication Sig Dispense Refill   aspirin EC 81 MG tablet Take 81 mg by mouth daily.     cholecalciferol (VITAMIN D) 1000 UNITS tablet Take 1,000 Units by mouth daily.     fluticasone (FLONASE) 50 MCG/ACT nasal spray Place 2 sprays into both nostrils daily. 16 g 6   hydrochlorothiazide (HYDRODIURIL) 25 MG tablet TAKE 1 TABLET BY MOUTH EVERY DAY 90 tablet 2   losartan (COZAAR) 100 MG tablet TAKE 1 TABLET(100 MG) BY MOUTH DAILY 90 tablet 2   predniSONE (DELTASONE) 20 MG tablet Take 2 tablets (40 mg total) by mouth daily. 10 tablet 0   No current facility-administered medications for this visit.  ROS:   General:  No weight loss, Fever, chills  HEENT: No recent headaches, no nasal bleeding, no visual changes, no sore throat  Neurologic: No dizziness, blackouts, seizures. No recent symptoms of stroke or mini- stroke. No recent episodes of slurred speech, or temporary blindness.  Cardiac: No recent episodes of chest pain/pressure, no shortness of breath at rest.  No shortness of breath with exertion.  Denies history of atrial fibrillation or irregular heartbeat  Vascular: No history of rest pain in feet.  No history of claudication.  No history of non-healing ulcer, No history of DVT   Pulmonary: No home oxygen, no productive cough, no hemoptysis,  No asthma or wheezing  Musculoskeletal:  [ ]  Arthritis, [ ]  Low back pain,  [ ]  Joint pain  Hematologic:No history of hypercoagulable state.  No history of easy bleeding.  No history of anemia  Gastrointestinal: No hematochezia or melena,  No gastroesophageal reflux, no trouble swallowing  Urinary: [ ]  chronic  Kidney disease, [ ]  on HD - [ ]  MWF or [ ]  TTHS, [ ]  Burning with urination, [ ]  Frequent urination, [ ]  Difficulty urinating;   Skin: No rashes  Psychological: No history of anxiety,  No history of depression   Physical Examination  Vitals:   09/13/21 1015  Resp: 14  Weight: 146 lb (66.2 kg)  Height: 5\' 3"  (1.6 m)    Body mass index is 25.86 kg/m.  General:  Alert and oriented, no acute distress HEENT: Normal Neck: No bruit or JVD Pulmonary: Clear to auscultation bilaterally Cardiac: Regular Rate and Rhythm without murmur Gastrointestinal: Soft, non-tender, non-distended, no mass, no scars Skin: No rash Extremity Pulses:  2+ radial, brachial, femoral, dorsalis pedis, posterior tibial pulses bilaterally Musculoskeletal: No deformity or edema  Neurologic: Upper and lower extremity motor 5/5 and symmetric  DATA:     Right Carotid Findings:  +----------+-------+--------+--------+-----------------------+-------------  ----+            PSV    EDV cm/sStenosisPlaque Description     Comments                      cm/s                                                              +----------+-------+--------+--------+-----------------------+-------------  ----+  CCA Prox  69     9                                                          +----------+-------+--------+--------+-----------------------+-------------  ----+  CCA Mid   77     12                                                         +----------+-------+--------+--------+-----------------------+-------------  ----+  CCA Distal61     12              heterogenous and  intimal                                               calcific               thickening          +----------+-------+--------+--------+-----------------------+-------------  ----+  ICA Prox  132    26      1-39%   heterogenous and                                                             calcific                                   +----------+-------+--------+--------+-----------------------+-------------  ----+  ICA Mid   167    29                                                         +----------+-------+--------+--------+-----------------------+-------------  ----+  ICA Distal153    29                                                         +----------+-------+--------+--------+-----------------------+-------------  ----+  ECA       243    0       >50%    heterogenous and                                                            calcific                                   +----------+-------+--------+--------+-----------------------+-------------  ----+   +----------+--------+-------+----------------+-------------------+            PSV cm/sEDV cmsDescribe        Arm Pressure (mmHG)  +----------+--------+-------+----------------+-------------------+  Subclavian170            Multiphasic, WNL                     +----------+--------+-------+----------------+-------------------+   +---------+--------+--+--------+--+---------+  VertebralPSV cm/s78EDV cm/s13Antegrade  +---------+--------+--+--------+--+---------+       Left Carotid Findings:  +----------+--------+--------+--------+-------------------------+--------+            PSV cm/sEDV cm/sStenosisPlaque Description       Comments  +----------+--------+--------+--------+-------------------------+--------+  CCA Prox  92      11                                                 +----------+--------+--------+--------+-------------------------+--------+  CCA Mid   84      15              heterogenous                       +----------+--------+--------+--------+-------------------------+--------+  CCA Distal67      12              heterogenous                       +----------+--------+--------+--------+-------------------------+--------+   ICA Prox  220     43      40-59%  heterogenous and calcific          +----------+--------+--------+--------+-------------------------+--------+  ICA Mid   164     24                                                 +----------+--------+--------+--------+-------------------------+--------+  ICA Distal93      23                                                 +----------+--------+--------+--------+-------------------------+--------+  ECA       240     8       >50%    heterogenous                       +----------+--------+--------+--------+-------------------------+--------+   +----------+--------+--------+----------------+-------------------+            PSV cm/sEDV cm/sDescribe        Arm Pressure (mmHG)  +----------+--------+--------+----------------+-------------------+  [id number]             Multiphasic, WNL                     +----------+--------+--------+----------------+-------------------+   +---------+--------+--+--------+--+---------+  VertebralPSV cm/s68EDV cm/s11Antegrade  +---------+--------+--+--------+--+---------+      Summary:  Right Carotid: Velocities in the right ICA are consistent with a 1-39%  stenosis.                 The ECA appears >50% stenosed.   Left Carotid: Velocities in the left ICA are consistent with a 40-59%  stenosis.                The ECA appears >50% stenosed.   Vertebrals:  Bilateral vertebral arteries demonstrate antegrade flow.  Subclavians: Normal flow hemodynamics were seen in bilateral subclavian               arteries.   ASSESSMENT: Asymptomatic Carotid stenosis The carotid duplex is accentually the same.  She remains asymptomatic.     PLAN:She will stay active, take daily ASA and f/u in 1 year for repeat carotid duplex.  We reviewed signs and symptoms of stroke.  If these occur she will call 911.  Roxy Horseman PA-C [facility] Office:  [phone]  MD in clinic [place]

## 2021-09-23 ENCOUNTER — Emergency Department (HOSPITAL_COMMUNITY)
Admission: EM | Admit: 2021-09-23 | Discharge: 2021-09-23 | Disposition: A | Discharge status: Home / Self Care | Payer: Medicare Other | Attending: Emergency Medicine | Admitting: Emergency Medicine

## 2021-09-23 ENCOUNTER — Encounter (HOSPITAL_COMMUNITY): Payer: Self-pay | Admitting: *Deleted

## 2021-09-23 ENCOUNTER — Emergency Department (HOSPITAL_COMMUNITY): Payer: Medicare Other

## 2021-09-23 ENCOUNTER — Other Ambulatory Visit: Payer: Self-pay

## 2021-09-23 DIAGNOSIS — K219 Gastro-esophageal reflux disease without esophagitis: Secondary | ICD-10-CM | POA: Diagnosis not present

## 2021-09-23 DIAGNOSIS — R0602 Shortness of breath: Secondary | ICD-10-CM | POA: Diagnosis not present

## 2021-09-23 DIAGNOSIS — Z79899 Other long term (current) drug therapy: Secondary | ICD-10-CM | POA: Insufficient documentation

## 2021-09-23 DIAGNOSIS — R519 Headache, unspecified: Secondary | ICD-10-CM | POA: Insufficient documentation

## 2021-09-23 DIAGNOSIS — Z87891 Personal history of nicotine dependence: Secondary | ICD-10-CM | POA: Insufficient documentation

## 2021-09-23 DIAGNOSIS — R0789 Other chest pain: Secondary | ICD-10-CM | POA: Insufficient documentation

## 2021-09-23 DIAGNOSIS — I10 Essential (primary) hypertension: Secondary | ICD-10-CM | POA: Insufficient documentation

## 2021-09-23 DIAGNOSIS — R911 Solitary pulmonary nodule: Secondary | ICD-10-CM | POA: Diagnosis not present

## 2021-09-23 DIAGNOSIS — Z7982 Long term (current) use of aspirin: Secondary | ICD-10-CM | POA: Insufficient documentation

## 2021-09-23 DIAGNOSIS — K449 Diaphragmatic hernia without obstruction or gangrene: Secondary | ICD-10-CM | POA: Diagnosis not present

## 2021-09-23 DIAGNOSIS — R079 Chest pain, unspecified: Secondary | ICD-10-CM | POA: Diagnosis not present

## 2021-09-23 LAB — CBC
HCT: 37.6 % (ref 36.0–46.0)
Hemoglobin: 12.6 g/dL (ref 12.0–15.0)
MCH: 31.8 pg (ref 26.0–34.0)
MCHC: 33.5 g/dL (ref 30.0–36.0)
MCV: 94.9 fL (ref 80.0–100.0)
Platelets: 303 10*3/uL (ref 150–400)
RBC: 3.96 MIL/uL (ref 3.87–5.11)
RDW: 12.9 % (ref 11.5–15.5)
WBC: 6.3 10*3/uL (ref 4.0–10.5)
nRBC: 0 % (ref 0.0–0.2)

## 2021-09-23 LAB — TROPONIN I (HIGH SENSITIVITY)
Troponin I (High Sensitivity): 3 ng/L (ref <18)
Troponin I (High Sensitivity): 3 ng/L (ref <18)

## 2021-09-23 LAB — BASIC METABOLIC PANEL
Anion gap: 8 (ref 5–15)
BUN: 15 mg/dL (ref 8–23)
CO2: 27 mmol/L (ref 22–32)
Calcium: 9.6 mg/dL (ref 8.9–10.3)
Chloride: 103 mmol/L (ref 98–111)
Creatinine, Ser: 0.69 mg/dL (ref 0.44–1.00)
GFR, Estimated: >60 mL/min (ref >60)
Glucose, Bld: 136 mg/dL — ABNORMAL HIGH (ref 70–99)
Potassium: 3.5 mmol/L (ref 3.5–5.1)
Sodium: 138 mmol/L (ref 135–145)

## 2021-09-23 LAB — D-DIMER, QUANTITATIVE: D-Dimer, Quant: 0.56 ug/mL-FEU — ABNORMAL HIGH (ref 0.00–0.50)

## 2021-09-23 MED ORDER — IOHEXOL 350 MG/ML SOLN
75.0000 mL | Freq: Once | INTRAVENOUS | Status: AC | PRN
  Administered 2021-09-23: 75 mL via INTRAVENOUS

## 2021-09-23 NOTE — ED Notes (Signed)
Patient stated that she had been waiting long enough and did not want to wait on discharge paperwork. IV removed.  Patient and family left.

## 2021-09-23 NOTE — ED Provider Notes (Signed)
[facility] EMERGENCY DEPARTMENT Provider Note   CSN: [id number] Arrival date & time: 09/23/21  1121     History Chief Complaint  Patient presents with   Chest Pain    Stacey Moody is a 75 y.o. female.   Chest Pain Associated symptoms: headache   Associated symptoms: no abdominal pain, no back pain, no cough, no dizziness, no fatigue, no fever, no nausea, no numbness, no palpitations, no shortness of breath, no vomiting and no weakness        Stacey Moody is a 75 y.o. female past medical history of carotid artery stenosis, GERD, hypertension, and right renal artery stenosis who presents to the Emergency Department complaining of sharp stabbing pains to the right chest that began at 11 AM today.  She states the episode felt like 5 or 6 "sharp sticks" to her right chest that was brief.  She later developed a sensation of heaviness to her chest that has since resolved.  Pain is nonpleuritic.  No shortness of breath or back pain.  She also describes having a right-sided headache that is throbbing and was gradual in onset.  No thunderclap headache.  No arm or jaw pain, visual change, nausea, vomiting, or diaphoresis.  Daughter states that she had COVID 2 months ago.  Patient states her blood pressure has been elevated recently and has been poorly controlled with her current blood pressure medications.  She has pain and "knots" to her both lower legs.  She had DVT study on 08/23/2021 that was negative for thrombus.   Past Medical History:  Diagnosis Date   Carotid artery stenosis    GERD (gastroesophageal reflux disease)    Hyperlipidemia    Hypertension    Influenza A 01/01/2014   Osteopenia    T-2.3   Right renal artery stenosis (HCC)    Syncope 12/31/2013   Vasovagal or hypovolemia/orthostatic    Patient Active Problem List   Diagnosis Date Noted   Unstable angina ([place])    Chest pain 01/07/2020   Right renal artery stenosis ([place])    Osteopenia    Abdominal pain    Legionella  infection ([place])    Gastroenteritis 12/31/2014   Elevated transaminase level 12/31/2014   CAP (community acquired pneumonia) 12/31/2014   Nausea vomiting and diarrhea 12/30/2014   Hypokalemia 12/30/2014   Hyponatremia 12/30/2014   Dehydration 12/30/2014   Fever 12/30/2014   Other pancytopenia ([place]) 01/03/2014   Carotid artery stenosis 01/03/2014   Vertigo 01/02/2014   Right otitis media 01/02/2014   Leukopenia 01/02/2014   Influenza A 01/01/2014   Scalp hematoma 01/01/2014   Syncope 12/31/2013   Diarrhea 12/31/2013   URI (upper respiratory infection) 12/31/2013   Hypertension    Hyperlipidemia    GERD (gastroesophageal reflux disease)    Occlusion and stenosis of carotid artery without mention of cerebral infarction 06/22/2012    Past Surgical History:  Procedure Laterality Date   ABDOMINAL HYSTERECTOMY  1982   Partial hysterectomy   COLONOSCOPY N/A 07/30/2016   Procedure: COLONOSCOPY;  Surgeon: Daneil Dolin, MD;  Location: [facility];  Service: Endoscopy;  Laterality: N/A;  8:30 AM   CT SCAN  July 2015   Chest   LEFT HEART CATH AND CORONARY ANGIOGRAPHY N/A 01/09/2020   Procedure: LEFT HEART CATH AND CORONARY ANGIOGRAPHY;  Surgeon: Nelva Bush, MD;  Location: [place] [facility];  Service: Cardiovascular;  Laterality: N/A;   POLYPECTOMY  07/30/2016   Procedure: POLYPECTOMY;  Surgeon: Daneil Dolin, MD;  Location: [facility];  Service: Endoscopy;;  descending colon   SMALL INTESTINE SURGERY       OB History   No obstetric history on file.     Family History  Problem Relation Age of Onset   Cancer Mother        pancratic   Heart attack Mother    Cancer Father        stomach   Diabetes Father    Hyperlipidemia Father    Heart disease Father        NOT  before age 50   Cancer Sister        lung   Hyperlipidemia Sister    Cancer Brother        thyroid, prostate   Hyperlipidemia Brother    Heart attack Brother    Cancer Sister        colon    Hyperlipidemia Sister    Cancer Sister        colon   Hyperlipidemia Sister     Social History   Tobacco Use   Smoking status: Former    Types: Cigarettes    Quit date: 12/22/1996    Years since quitting: 24.7   Smokeless tobacco: Never  Vaping Use   Vaping Use: Never used  Substance Use Topics   Alcohol use: No   Drug use: No    Home Medications Prior to Admission medications   Medication Sig Start Date End Date Taking? Authorizing Provider  aspirin EC 81 MG tablet Take 81 mg by mouth daily.    [provider]  cholecalciferol (VITAMIN D) 1000 UNITS tablet Take 1,000 Units by mouth daily.    [provider]  fluticasone (FLONASE) 50 MCG/ACT nasal spray Place 2 sprays into both nostrils daily. 01/09/20   Antonieta Pert, MD  hydrochlorothiazide (HYDRODIURIL) 25 MG tablet TAKE 1 TABLET BY MOUTH EVERY DAY 06/15/20   Alycia Rossetti, MD  losartan (COZAAR) 100 MG tablet TAKE 1 TABLET(100 MG) BY MOUTH DAILY 06/15/20   Lizton, Modena Nunnery, MD  predniSONE (DELTASONE) 20 MG tablet Take 2 tablets (40 mg total) by mouth daily. 08/27/21   Vanessa Kick, MD    Allergies    Tetanus toxoids  Review of Systems   Review of Systems  Constitutional:  Negative for chills, fatigue and fever.  Respiratory:  Positive for chest tightness. Negative for cough, shortness of breath and wheezing.   Cardiovascular:  Positive for chest pain. Negative for palpitations and leg swelling.  Gastrointestinal:  Negative for abdominal pain, nausea and vomiting.  Genitourinary:  Negative for dysuria, flank pain and hematuria.  Musculoskeletal:  Negative for arthralgias, back pain, myalgias, neck pain and neck stiffness.  Skin:  Negative for rash.  Neurological:  Positive for headaches. Negative for dizziness, weakness and numbness.  Hematological:  Does not bruise/bleed easily.   Physical Exam Updated Vital Signs BP (!) 160/58 (BP Location: Right Arm)   Pulse 66   Temp 98.4 F (36.9 C)   Resp 18    Ht 5\' 3"  (1.6 m)   Wt 67.1 kg   SpO2 100%   BMI 26.22 kg/m   Physical Exam Vitals and nursing note reviewed.  Constitutional:      Appearance: Normal appearance. She is not ill-appearing or toxic-appearing.  HENT:     Head: Normocephalic.  Eyes:     Extraocular Movements: Extraocular movements intact.     Conjunctiva/sclera: Conjunctivae normal.  Cardiovascular:     Rate and Rhythm: Normal  rate and regular rhythm.     Pulses: Normal pulses.  Pulmonary:     Effort: Pulmonary effort is normal.     Breath sounds: Normal breath sounds.  Chest:     Chest wall: No tenderness.  Abdominal:     Palpations: Abdomen is soft.     Tenderness: There is no abdominal tenderness. There is no guarding or rebound.  Musculoskeletal:        General: No tenderness. Normal range of motion.     Cervical back: Normal range of motion and neck supple.  Lymphadenopathy:     Cervical: No cervical adenopathy.  Skin:    General: Skin is warm.     Capillary Refill: Capillary refill takes less than 2 seconds.  Neurological:     General: No focal deficit present.     Mental Status: She is alert.     Sensory: No sensory deficit.     Motor: No weakness.  Psychiatric:        Judgment: Judgment normal.    ED Results / Procedures / Treatments   Labs (all labs ordered are listed, but only abnormal results are displayed) Labs Reviewed  BASIC METABOLIC PANEL - Abnormal; Notable for the following components:      Result Value   Glucose, Bld 136 (*)    All other components within normal limits  D-DIMER, QUANTITATIVE - Abnormal; Notable for the following components:   D-Dimer, Quant 0.56 (*)    All other components within normal limits  CBC  TROPONIN I (HIGH SENSITIVITY)  TROPONIN I (HIGH SENSITIVITY)    EKG EKG Interpretation  Date/Time:  Monday September 23 2021 11:38:59 EDT Ventricular Rate:  69 PR Interval:  150 QRS Duration: 76 QT Interval:  398 QTC Calculation: 426 R Axis:   64 Text  Interpretation: Normal sinus rhythm Nonspecific ST abnormality Abnormal ECG No significant change since last tracing Confirmed by Dorie Rank [id number]) on 09/23/2021 11:41:36 AM  Radiology DG Chest 2 View  Result Date: 09/23/2021 CLINICAL DATA:  Chest pain. EXAM: CHEST - 2 VIEW COMPARISON:  August 21, 2021. FINDINGS: The heart size and mediastinal contours are within normal limits. Both lungs are clear. The visualized skeletal structures are unremarkable. IMPRESSION: No active cardiopulmonary disease. Electronically Signed   By: Marijo Conception M.D.   On: 09/23/2021 12:30   CT Angio Chest PE W and/or Wo Contrast  Result Date: 09/23/2021 CLINICAL DATA:  Chest pain and shortness of breath. EXAM: CT ANGIOGRAPHY CHEST WITH CONTRAST TECHNIQUE: Multidetector CT imaging of the chest was performed using the standard protocol during bolus administration of intravenous contrast. Multiplanar CT image reconstructions and MIPs were obtained to evaluate the vascular anatomy. CONTRAST:  52mL OMNIPAQUE IOHEXOL 350 MG/ML SOLN COMPARISON:  September 04, 2020 FINDINGS: Cardiovascular: There is moderate to marked severity calcification of the aortic arch. Satisfactory opacification of the pulmonary arteries to the segmental level. No evidence of pulmonary embolism. Normal heart size. No pericardial effusion. Mediastinum/Nodes: No enlarged mediastinal, hilar, or axillary lymph nodes. Thyroid gland, trachea, and esophagus demonstrate no significant findings. Lungs/Pleura: Moderate to marked severity areas of biapical scarring and/or atelectasis are seen. Multiple stable, bilateral subcentimeter calcified lung nodules are seen. There is no evidence of acute infiltrate, pleural effusion or pneumothorax. Upper Abdomen: There is a small hiatal hernia. Musculoskeletal: No chest wall abnormality. No acute or significant osseous findings. Review of the MIP images confirms the above findings. IMPRESSION: 1. No evidence of pulmonary embolus  or other acute intrathoracic process.  2. Moderate to marked severity biapical scarring and/or atelectasis. 3. Small hiatal hernia. 4. Aortic atherosclerosis. Aortic Atherosclerosis (ICD10-I70.0). Electronically Signed   By: Virgina Norfolk M.D.   On: 09/23/2021 20:13    Procedures Procedures   Medications Ordered in ED Medications - No data to display  ED Course  I have reviewed the triage vital signs and the nursing notes.  Pertinent labs & imaging results that were available during my care of the patient were reviewed by me and considered in my medical decision making (see chart for details).    MDM Rules/Calculators/A&P                           Patient here with onset of sharp stabbing right-sided chest pain at rest starting at 11 AM today.  Pain spontaneously resolved, but having chest heaviness since onset.  Patient has history of carotid artery stenosis, evaluated by vascular surgery 09/13/2021.  On exam, patient well-appearing nontoxic.  No chest pain or heaviness at present.  No JVD or bruits on exam.  No peripheral edema or orthopnea.  No dyspnea on exertion EKG without ischemic treatment.  Delta troponin reassuring.  Chemistries without significant electrolyte derangement.  No leukocytosis.  D-dimer mildly elevated.  Patient endorses history of LE varicosities and recent COVID.  Feel that she would benefit from CT angio of chest given recent covid although age adjusted D-dimer suggest PE unlikely.   On recheck, patient resting comfortably.  CT angio of chest negative for evidence of PE.  Doubt ACS.  Patient also seen by Dr. Eulis Foster and care plan discussed.  Feel that she is appropriate for discharge home.  She will follow-up closely with her cardiologist.  Strict return precautions were discussed and all questions were answered.   Final Clinical Impression(s) / ED Diagnoses Final diagnoses:  Atypical chest pain    Rx / DC Orders ED Discharge Orders     None         Bufford Lope 09/23/21 2353    Daleen Bo, MD 09/24/21 [phone]

## 2021-09-23 NOTE — ED Provider Notes (Signed)
.................   Face-to-face evaluation   History: She presents for evaluation of chest discomfort which started just prior to arrival.  The pain is worse with deep breathing.  She describes the pain as sharp and spontaneous onset about 1030 this morning.  It has since resolved.  She does not have a prior cardiac history.  She felt hot and like she could not take a deep breath when she had the pain.  She did not take her temperature and does not feel like she was sweaty.  No other recent problems or similar problems in the past.  She had COVID infection complicated by post COVID-pneumonia, several months ago.  Physical exam: Elderly alert, calm and cooperative.  No respiratory distress.  She is lucid.  Medical screening examination/treatment/procedure(s) were conducted as a shared visit with non-physician practitioner(s) and myself.  I personally evaluated the patient during the encounter    Daleen Bo, MD 09/24/21 [phone]

## 2021-09-23 NOTE — ED Provider Notes (Signed)
Emergency Medicine Provider Triage Evaluation Note  Stacey Moody , a 75 y.o. female  was evaluated in triage.  Pt complains of sharp pain in her chest that started 30 minutes prior to arrival.  Patient states the pain increases with breathing.  No history of heart disease or PE.  Review of Systems  Positive: Chest pain Negative: Fevers or chills  Physical Exam  BP (!) 174/61 (BP Location: Right Arm)   Pulse 76   Temp 98 F (36.7 C) (Oral)   Resp 17   Ht 1.6 m (5\' 3" )   Wt 67.1 kg   SpO2 94%   BMI 26.22 kg/m  Gen:   Awake, no distress   Resp:  Normal effort  MSK:   Moves extremities without difficulty  Other:  Cardiac: Regular rate and rhythm no murmurs  Medical Decision Making  Medically screening exam initiated at 12:56 PM.  Appropriate orders placed.  Karel Jarvis was informed that the remainder of the evaluation will be completed by another provider, this initial triage assessment does not replace that evaluation, and the importance of remaining in the ED until their evaluation is complete.  We will proceed with cardiac work-up.  Add on D-dimer to assess for PE   Dorie Rank, MD 09/23/21 1256

## 2021-09-23 NOTE — ED Notes (Signed)
Pt states that she had stabbing pains this morning around 11. Pt states that the pains lasted about 15 seconds. Pt also states that she has not had no pains since this morning.  Pt states that she does not have any pain at this time.

## 2021-09-23 NOTE — ED Triage Notes (Signed)
Pt c/o mid chest pain and SOB that started 30 minutes PTA. Denies radiation of pain. Pt reports "little dizziness". No n/v.

## 2021-09-23 NOTE — Discharge Instructions (Addendum)
Your work-up today was reassuring.  Please contact your cardiologist tomorrow to arrange a follow-up appointment.  Return emergency department for any new or worsening symptoms.

## 2021-09-23 NOTE — ED Notes (Signed)
Patient transported to CT 

## 2021-09-24 DIAGNOSIS — E7800 Pure hypercholesterolemia, unspecified: Secondary | ICD-10-CM | POA: Diagnosis not present

## 2021-09-24 DIAGNOSIS — R079 Chest pain, unspecified: Secondary | ICD-10-CM | POA: Diagnosis not present

## 2021-09-24 DIAGNOSIS — Z299 Encounter for prophylactic measures, unspecified: Secondary | ICD-10-CM | POA: Diagnosis not present

## 2021-09-24 DIAGNOSIS — I10 Essential (primary) hypertension: Secondary | ICD-10-CM | POA: Diagnosis not present

## 2021-09-24 DIAGNOSIS — R110 Nausea: Secondary | ICD-10-CM | POA: Diagnosis not present

## 2021-09-26 ENCOUNTER — Telehealth: Payer: Self-pay

## 2021-09-26 NOTE — Telephone Encounter (Signed)
SCANNED NOTES TO REFERRAL

## 2021-09-30 DIAGNOSIS — R079 Chest pain, unspecified: Secondary | ICD-10-CM | POA: Diagnosis not present

## 2021-10-15 NOTE — Progress Notes (Signed)
Cardiology Office Note    Date:  10/22/2021   ID:  EVERLINA GOTTS, DOB August 25, 1946, MRN [id number]   PCP:  Medicine, [facility]   [facility]  Cardiologist:  Jenkins Rouge, MD   Advanced Practice Provider:  No care team member to display Electrophysiologist:  None   [phone]   Chief Complaint  Patient presents with   Follow-up    History of Present Illness:  Stacey Moody is a 75 y.o. female with a past medical history significant for tension, hyperlipidemia, coronary artery stenosis and possible renal artery stenosis by prior imaging in 2018.  Strong family history of CAD in both parents.  She is a former smoker having quit in 1998. She underwent left heart cath on 01/09/2020 showed diffuse nonobstructive first diagonal, distal LAD and RCA disease.  There was no wall motion abnormality.  Aggressive preventive therapy was recommended with LDL goal of less than 70, blood pressure less than 130/80 and A1c less than 7.  She was continued on high intensity statin.   LOV 01/26/20 doing well.  In ED 09/22/21 with chest pain but left after a long wait. Labs stable, troponin negative x 2, D Dimer up 0.56, CTA no PE, mod to sever biapical scarring or atelectasis, aortic atherosclerosis.   Patient comes in for f/u. Denies any further chest pain. BP has been running high and started on amlodipine and metoprolol. BP doing better since then. Still working at dental office 4 days a week and walks 1/4 mile 3-4 times/week.  She complains of bilateral leg pain along her veins.  Dopplers were stable.  No significant varicosities.  She says its been this way since she had COVID in June.   Past Medical History:  Diagnosis Date   Carotid artery stenosis    GERD (gastroesophageal reflux disease)    Hyperlipidemia    Hypertension    Influenza A 01/01/2014   Osteopenia    T-2.3   Right renal artery stenosis (HCC)    Syncope 12/31/2013   Vasovagal or hypovolemia/orthostatic     Past Surgical History:  Procedure Laterality Date   ABDOMINAL HYSTERECTOMY  1982   Partial hysterectomy   COLONOSCOPY N/A 07/30/2016   Procedure: COLONOSCOPY;  Surgeon: Daneil Dolin, MD;  [facility];  Service: Endoscopy;  Laterality: N/A;  8:30 AM   CT SCAN  July 2015   Chest   LEFT HEART CATH AND CORONARY ANGIOGRAPHY N/A 01/09/2020   Procedure: LEFT HEART CATH AND CORONARY ANGIOGRAPHY;  Surgeon: Nelva Bush, MD;  Location: [facility];  Service: Cardiovascular;  Laterality: N/A;   POLYPECTOMY  07/30/2016   Procedure: POLYPECTOMY;  Surgeon: Daneil Dolin, MD;  [facility];  Service: Endoscopy;;  descending colon   SMALL INTESTINE SURGERY      Current Medications: Current Meds  Medication Sig   amLODipine (NORVASC) 5 MG tablet Take 5 mg by mouth daily.   aspirin EC 81 MG tablet Take 81 mg by mouth daily.   atorvastatin (LIPITOR) 10 MG tablet Take 10 mg by mouth 2 (two) times a week.   cholecalciferol (VITAMIN D) 1000 UNITS tablet Take 1,000 Units by mouth daily.   fluticasone (FLONASE) 50 MCG/ACT nasal spray Place 2 sprays into both nostrils daily.   hydrochlorothiazide (HYDRODIURIL) 25 MG tablet TAKE 1 TABLET BY MOUTH EVERY DAY   losartan (COZAAR) 100 MG tablet TAKE 1 TABLET(100 MG) BY MOUTH DAILY   metoprolol succinate (TOPROL-XL) 25  MG 24 hr tablet Take 25 mg by mouth 2 (two) times daily.     Allergies:   Tetanus toxoids   Social History   Socioeconomic History   Marital status: Widowed    Spouse name: Not on file   Number of children: Not on file   Years of education: Not on file   Highest education level: Not on file  Occupational History   Not on file  Tobacco Use   Smoking status: Former    Types: Cigarettes    Quit date: 12/22/1996    Years since quitting: 24.8   Smokeless tobacco: Never  Vaping Use   Vaping Use: Never used  Substance and Sexual Activity   Alcohol use: No   Drug use: No   Sexual activity: Not on file   Other Topics Concern   Not on file  Social History Narrative   Not on file   Social Determinants of Health   Financial Resource Strain: Not on file  Food Insecurity: Not on file  Transportation Needs: Not on file  Physical Activity: Not on file  Stress: Not on file  Social Connections: Not on file     Family History:  The patient's  family history includes Cancer in her brother, father, mother, sister, sister, and sister; Diabetes in her father; Heart attack in her brother and mother; Heart disease in her father; Hyperlipidemia in her brother, father, sister, sister, and sister.   ROS:   Please see the history of present illness.    ROS All other systems reviewed and are negative.   PHYSICAL EXAM:   VS:  BP (!) 126/54   Pulse 64   Ht 5\' 3"  (1.6 m)   Wt 152 lb 3.2 oz (69 kg)   SpO2 95%   BMI 26.96 kg/m   Physical Exam  GEN: Well nourished, well developed, in no acute distress  Neck: Bilateral carotid bruits no JVD, or masses Cardiac:RRR; no murmurs, rubs, or gallops  Respiratory:  clear to auscultation bilaterally, normal work of breathing GI: soft, nontender, nondistended, + BS Ext: without cyanosis, clubbing, or edema, Good distal pulses bilaterally Neuro:  Alert and Oriented x 3 Psych: euthymic mood, full affect  Wt Readings from Last 3 Encounters:  10/22/21 152 lb 3.2 oz (69 kg)  09/23/21 148 lb (67.1 kg)  09/13/21 146 lb (66.2 kg)      Studies/Labs Reviewed:   EKG:  EKG is not ordered today.    Recent Labs: 09/23/2021: BUN 15; Creatinine, Ser 0.69; Hemoglobin 12.6; Platelets 303; Potassium 3.5; Sodium 138   Lipid Panel    Component Value Date/Time   CHOL 131 01/07/2020 0706   TRIG 63 01/07/2020 0706   HDL 64 01/07/2020 0706   CHOLHDL 2.0 01/07/2020 0706   VLDL 13 01/07/2020 0706   LDLCALC 54 01/07/2020 0706    Additional studies/ records that were reviewed today include:  CTA 09/23/21 FINDINGS: Cardiovascular: There is moderate to marked  severity calcification of the aortic arch. Satisfactory opacification of the pulmonary arteries to the segmental level. No evidence of pulmonary embolism. Normal heart size. No pericardial effusion.   Mediastinum/Nodes: No enlarged mediastinal, hilar, or axillary lymph nodes. Thyroid gland, trachea, and esophagus demonstrate no significant findings.   Lungs/Pleura: Moderate to marked severity areas of biapical scarring and/or atelectasis are seen.   Multiple stable, bilateral subcentimeter calcified lung nodules are seen.   There is no evidence of acute infiltrate, pleural effusion or pneumothorax.   Upper Abdomen: There is  a small hiatal hernia.   Musculoskeletal: No chest wall abnormality. No acute or significant osseous findings.   Review of the MIP images confirms the above findings.   IMPRESSION: 1. No evidence of pulmonary embolus or other acute intrathoracic process. 2. Moderate to marked severity biapical scarring and/or atelectasis. 3. Small hiatal hernia. 4. Aortic atherosclerosis.   Aortic Atherosclerosis (ICD10-I70.0).     Electronically Signed   By: Virgina Norfolk M.D.   On: 09/23/2021 20:13   Carotid Dopplers 09/13/2021  Summary:  Right Carotid: Velocities in the right ICA are consistent with a 1-39%  stenosis.                The ECA appears >50% stenosed.   Left Carotid: Velocities in the left ICA are consistent with a 40-59%  stenosis.               The ECA appears >50% stenosed.   Vertebrals:  Bilateral vertebral arteries demonstrate antegrade flow.  Subclavians: Normal flow hemodynamics were seen in bilateral subclavian               arteries.   *See table(s) above for measurements and observations.      Electronically signed by Orlie Pollen on 09/13/2021 at 5:12:09 PM.    Cardiac catheterization 01/09/2020 Conclusions: Mild, non-obstructive coronary artery disease, including 10-20% distal LMCA and 30% proximal RCA stenoses. Low left  ventricular filling pressure.   Recommendations: Medical therapy and risk factor modification to prevent progression of coronary artery disease. Post-cath hydration.   Nelva Bush, MD [facility]  2D echo 01/07/2020 IMPRESSIONS     1. Left ventricular ejection fraction, by visual estimation, is 60 to  65%. The left ventricle has normal function. There is mildly increased  left ventricular hypertrophy.   2. The left ventricle has no regional wall motion abnormalities.   3. Global right ventricle has normal systolic function.The right  ventricular size is normal. No increase in right ventricular wall  thickness.   4. Left atrial size was normal.   5. Right atrial size was normal.   6. The mitral valve is normal in structure. No evidence of mitral valve  regurgitation. No evidence of mitral stenosis.   7. The tricuspid valve is normal in structure.   8. The aortic valve is normal in structure. Aortic valve regurgitation is  not visualized. No evidence of aortic valve sclerosis or stenosis.   9. The pulmonic valve was normal in structure. Pulmonic valve  regurgitation is not visualized.  10. TR signal is inadequate for assessing pulmonary artery systolic  pressure.  11. The inferior vena cava is normal in size with greater than 50%  respiratory variability, suggesting right atrial pressure of 3 mmHg.   Risk Assessment/Calculations:         ASSESSMENT:    1. Coronary artery disease involving native coronary artery of native heart without angina pectoris   2. Essential hypertension   3. Hyperlipidemia, unspecified hyperlipidemia type   4. Bilateral carotid artery stenosis      PLAN:  In order of problems listed above:  Mild nonobstructive CAD on cath 01/09/20, normal LVEF, aggressive risk factor mod-recent ER visit with chest pain troponins negative, EKG unchanged.  She left after extended stay.  No recurrent chest pain. Continue current meds  HTN much better  controlled with the addition of amlodipine and metoprolol to the losartan.  HLD managed by PCP.  After recent lipid panel she was placed on atorvastatin 10 mg twice  weekly.  With her carotid stenosis recommend she try to take it at least 5 days a week.  She has not had follow-up lipids but will follow up with PCP who manages this.  Carotid stenosis 40 to 19% LICA on Dopplers 3/79/0240 recommend she increase her atorvastatin to 10 mg at least 5 days a week.  Shared Decision Making/Informed Consent        Medication Adjustments/Labs and Tests Ordered: Current medicines are reviewed at length with the patient today.  Concerns regarding medicines are outlined above.  Medication changes, Labs and Tests ordered today are listed in the Patient Instructions below. Patient Instructions  Medication Instructions:  Your physician recommends that you continue on your current medications as directed. Please refer to the Current Medication list given to you today.  Try to take Atorvastatin to 5 days a week   *If you need a refill on your cardiac medications before your next appointment, please call your pharmacy*   Lab Work: NONE   If you have labs (blood work) drawn today and your tests are completely normal, you will receive your results only by: [place] (if you have MyChart) OR A paper copy in the mail If you have any lab test that is abnormal or we need to change your treatment, we will call you to review the results.   Testing/Procedures: NONE    Follow-Up: At [facility], you and your health needs are our priority.  As part of our continuing mission to provide you with exceptional heart care, we have created designated Provider Care Teams.  These Care Teams include your primary Cardiologist (physician) and Advanced Practice Providers (APPs -  Physician Assistants and Nurse Practitioners) who all work together to provide you with the care you need, when you need it.  We  recommend signing up for the patient portal called "MyChart".  Sign up information is provided on this After Visit Summary.  MyChart is used to connect with patients for Virtual Visits (Telemedicine).  Patients are able to view lab/test results, encounter notes, upcoming appointments, etc.  Non-urgent messages can be sent to your provider as well.   To learn more about what you can do with MyChart, go to [email or web address].    Your next appointment:   6 month(s)  The format for your next appointment:   In Person  Provider:   Jenkins Rouge, MD   Other Instructions Thank you for choosing [facility]!     Signed, Ermalinda Barrios, PA-C  10/22/2021 2:25 PM    [facility] [place], [place],   [address] Phone: [phone]; Fax: [phone]

## 2021-10-21 DIAGNOSIS — I10 Essential (primary) hypertension: Secondary | ICD-10-CM | POA: Diagnosis not present

## 2021-10-22 ENCOUNTER — Ambulatory Visit (INDEPENDENT_AMBULATORY_CARE_PROVIDER_SITE_OTHER): Payer: Medicare Other | Admitting: Physician Assistant

## 2021-10-22 ENCOUNTER — Other Ambulatory Visit: Payer: Self-pay

## 2021-10-22 ENCOUNTER — Encounter: Payer: Self-pay | Admitting: Physician Assistant

## 2021-10-22 VITALS — BP 126/54 | HR 64 | Ht 63.0 in | Wt 152.2 lb

## 2021-10-22 DIAGNOSIS — I2510 Atherosclerotic heart disease of native coronary artery without angina pectoris: Secondary | ICD-10-CM | POA: Diagnosis not present

## 2021-10-22 DIAGNOSIS — E785 Hyperlipidemia, unspecified: Secondary | ICD-10-CM

## 2021-10-22 DIAGNOSIS — I10 Essential (primary) hypertension: Secondary | ICD-10-CM

## 2021-10-22 DIAGNOSIS — I6523 Occlusion and stenosis of bilateral carotid arteries: Secondary | ICD-10-CM

## 2021-10-22 NOTE — Patient Instructions (Signed)
Medication Instructions:  Your physician recommends that you continue on your current medications as directed. Please refer to the Current Medication list given to you today.  Try to take Atorvastatin to 5 days a week   *If you need a refill on your cardiac medications before your next appointment, please call your pharmacy*   Lab Work: NONE   If you have labs (blood work) drawn today and your tests are completely normal, you will receive your results only by: [place] (if you have MyChart) OR A paper copy in the mail If you have any lab test that is abnormal or we need to change your treatment, we will call you to review the results.   Testing/Procedures: NONE    Follow-Up: At [facility], you and your health needs are our priority.  As part of our continuing mission to provide you with exceptional heart care, we have created designated Provider Care Teams.  These Care Teams include your primary Cardiologist (physician) and Advanced Practice Providers (APPs -  Physician Assistants and Nurse Practitioners) who all work together to provide you with the care you need, when you need it.  We recommend signing up for the patient portal called "MyChart".  Sign up information is provided on this After Visit Summary.  MyChart is used to connect with patients for Virtual Visits (Telemedicine).  Patients are able to view lab/test results, encounter notes, upcoming appointments, etc.  Non-urgent messages can be sent to your provider as well.   To learn more about what you can do with MyChart, go to [email or web address].    Your next appointment:   6 month(s)  The format for your next appointment:   In Person  Provider:   Jenkins Rouge, MD   Other Instructions Thank you for choosing [facility]!

## 2021-11-19 ENCOUNTER — Telehealth: Payer: Self-pay | Admitting: *Deleted

## 2021-11-19 ENCOUNTER — Other Ambulatory Visit: Payer: Self-pay

## 2021-11-19 ENCOUNTER — Encounter: Payer: Self-pay | Admitting: *Deleted

## 2021-11-19 ENCOUNTER — Encounter: Payer: Self-pay | Admitting: Internal Medicine

## 2021-11-19 ENCOUNTER — Ambulatory Visit (INDEPENDENT_AMBULATORY_CARE_PROVIDER_SITE_OTHER): Payer: Medicare Other | Admitting: Internal Medicine

## 2021-11-19 VITALS — BP 170/62 | HR 70 | Temp 96.9°F | Ht 63.0 in | Wt 152.6 lb

## 2021-11-19 DIAGNOSIS — Z86010 Personal history of colonic polyps: Secondary | ICD-10-CM | POA: Diagnosis not present

## 2021-11-19 DIAGNOSIS — I6523 Occlusion and stenosis of bilateral carotid arteries: Secondary | ICD-10-CM

## 2021-11-19 DIAGNOSIS — K921 Melena: Secondary | ICD-10-CM

## 2021-11-19 MED ORDER — PEG 3350-KCL-NA BICARB-NACL 420 G PO SOLR: ORAL | 0 refills | Status: DC

## 2021-11-19 NOTE — Telephone Encounter (Signed)
Called pt and made aware of pre-op appt details. She voiced understanding.

## 2021-11-19 NOTE — Progress Notes (Signed)
Primary Care Physician:  Medicine, [facility] Primary Gastroenterologist:  Dr. Gala Romney  Pre-Procedure History & Physical: HPI:  Stacey Moody is a 75 y.o. female here for further evaluation of a couple of episodes of paper hematochezia when wiping / having a bowel movement.  1 episode was moderately large as she described.  That was about a month ago.  No bleeding since that time and none before.  Last colonoscopy 2017-ascending colon adenoma removed.  Adenomas removed prior to 2017 as well.  She denies constipation or straining.  No abdominal pain;  no upper GI tract symptoms.  Past Medical History:  Diagnosis Date   Carotid artery stenosis    GERD (gastroesophageal reflux disease)    Hyperlipidemia    Hypertension    Influenza A 01/01/2014   Osteopenia    T-2.3   Right renal artery stenosis (HCC)    Syncope 12/31/2013   Vasovagal or hypovolemia/orthostatic    Past Surgical History:  Procedure Laterality Date   ABDOMINAL HYSTERECTOMY  1982   Partial hysterectomy   COLONOSCOPY N/A 07/30/2016   Procedure: COLONOSCOPY;  Surgeon: Daneil Dolin, MD;  [facility];  Service: Endoscopy;  Laterality: N/A;  8:30 AM   CT SCAN  July 2015   Chest   LEFT HEART CATH AND CORONARY ANGIOGRAPHY N/A 01/09/2020   Procedure: LEFT HEART CATH AND CORONARY ANGIOGRAPHY;  Surgeon: Nelva Bush, MD;  Location: [facility];  Service: Cardiovascular;  Laterality: N/A;   POLYPECTOMY  07/30/2016   Procedure: POLYPECTOMY;  Surgeon: Daneil Dolin, MD;  [facility];  Service: Endoscopy;;  descending colon   SMALL INTESTINE SURGERY      Prior to Admission medications   Medication Sig Start Date End Date Taking? Authorizing Provider  amLODipine (NORVASC) 5 MG tablet Take 5 mg by mouth daily. 09/26/21  Yes [provider]  aspirin EC 81 MG tablet Take 81 mg by mouth daily.   Yes [provider]  atorvastatin (LIPITOR) 10 MG tablet Take 10 mg by mouth 2 (two)  times a week. 09/24/21  Yes [provider]  cholecalciferol (VITAMIN D) 1000 UNITS tablet Take 1,000 Units by mouth daily.   Yes [provider]  fluticasone (FLONASE) 50 MCG/ACT nasal spray Place 2 sprays into both nostrils daily. Patient taking differently: Place 2 sprays into both nostrils as needed. 01/09/20  Yes Antonieta Pert, MD  hydrochlorothiazide (HYDRODIURIL) 25 MG tablet TAKE 1 TABLET BY MOUTH EVERY DAY 06/15/20  Yes [place], Modena Nunnery, MD  losartan (COZAAR) 100 MG tablet TAKE 1 TABLET(100 MG) BY MOUTH DAILY 06/15/20  Yes [place], Modena Nunnery, MD  metoprolol succinate (TOPROL-XL) 25 MG 24 hr tablet Take 25 mg by mouth daily. 08/12/21  Yes [provider]  predniSONE (DELTASONE) 20 MG tablet Take 2 tablets (40 mg total) by mouth daily. 08/27/21   Vanessa Kick, MD    Allergies as of 11/19/2021 - Review Complete 11/19/2021  Allergen Reaction Noted   Tetanus toxoids Swelling 05/07/2011    Family History  Problem Relation Age of Onset   Cancer Mother        pancratic   Heart attack Mother    Cancer Father        stomach   Diabetes Father    Hyperlipidemia Father    Heart disease Father        NOT  before age 29   Cancer Sister        lung  Hyperlipidemia Sister    Cancer Brother        thyroid, prostate   Hyperlipidemia Brother    Heart attack Brother    Cancer Sister        colon   Hyperlipidemia Sister    Cancer Sister        colon   Hyperlipidemia Sister     Social History   Socioeconomic History   Marital status: Widowed    Spouse name: Not on file   Number of children: Not on file   Years of education: Not on file   Highest education level: Not on file  Occupational History   Not on file  Tobacco Use   Smoking status: Former    Types: Cigarettes    Quit date: 12/22/1996    Years since quitting: 24.9   Smokeless tobacco: Never  Vaping Use   Vaping Use: Never used  Substance and Sexual Activity   Alcohol use: No   Drug use: No    Sexual activity: Not on file  Other Topics Concern   Not on file  Social History Narrative   Not on file   Social Determinants of Health   Financial Resource Strain: Not on file  Food Insecurity: Not on file  Transportation Needs: Not on file  Physical Activity: Not on file  Stress: Not on file  Social Connections: Not on file  Intimate Partner Violence: Not on file    Review of Systems: See HPI, otherwise negative ROS  Physical Exam: BP (!) 170/62   Pulse 70   Temp (!) 96.9 F (36.1 C) (Temporal)   Ht 5\' 3"  (1.6 m)   Wt 152 lb 9.6 oz (69.2 kg)   BMI 27.03 kg/m  General:   Alert,  Well-developed, well-nourished, pleasant and cooperative in NAD Neck:  Supple; no masses or thyromegaly. No significant cervical adenopathy. Lungs:  Clear throughout to auscultation.   No wheezes, crackles, or rhonchi. No acute distress. Heart:  Regular rate and rhythm; no murmurs, clicks, rubs,  or gallops. Abdomen: Non-distended, normal bowel sounds.  Soft and nontender without appreciable mass or hepatosplenomegaly.  Rectal: Deferred until the time of colonoscopy  Impression/Plan: Pleasant 75 years old lady with a history of multiple colonic adenomas removed over time now with intermittent paper hematochezia. Likely anorectal in origin.  However, she needs full evaluation of her lower GI tract to further evaluate.  Recommendations:  To this end, I have offered the patient a diagnostic colonoscopy in the near future. The risks, benefits, limitations, alternatives and imponderables have been reviewed with the patient. Questions have been answered. All parties are agreeable.    ASA 3/propofol.     Notice: This dictation was prepared with Dragon dictation along with smaller phrase technology. Any transcriptional errors that result from this process are unintentional and may not be corrected upon review.

## 2021-11-19 NOTE — H&P (View-Only) (Signed)
Primary Care Physician:  Medicine, [facility] Internal Primary Gastroenterologist:  Dr. Gala Romney  Pre-Procedure History & Physical: HPI:  Stacey Moody is a 75 y.o. female here for further evaluation of a couple of episodes of paper hematochezia when wiping / having a bowel movement.  1 episode was moderately large as she described.  That was about a month ago.  No bleeding since that time and none before.  Last colonoscopy 2017-ascending colon adenoma removed.  Adenomas removed prior to 2017 as well.  She denies constipation or straining.  No abdominal pain;  no upper GI tract symptoms.  Past Medical History:  Diagnosis Date   Carotid artery stenosis    GERD (gastroesophageal reflux disease)    Hyperlipidemia    Hypertension    Influenza A 01/01/2014   Osteopenia    T-2.3   Right renal artery stenosis (HCC)    Syncope 12/31/2013   Vasovagal or hypovolemia/orthostatic    Past Surgical History:  Procedure Laterality Date   ABDOMINAL HYSTERECTOMY  1982   Partial hysterectomy   COLONOSCOPY N/A 07/30/2016   Procedure: COLONOSCOPY;  Surgeon: Daneil Dolin, MD;  [facility];  Service: Endoscopy;  Laterality: N/A;  8:30 AM   CT SCAN  July 2015   Chest   LEFT HEART CATH AND CORONARY ANGIOGRAPHY N/A 01/09/2020   Procedure: LEFT HEART CATH AND CORONARY ANGIOGRAPHY;  Surgeon: Nelva Bush, MD;  Location: [place] [facility];  Service: Cardiovascular;  Laterality: N/A;   POLYPECTOMY  07/30/2016   Procedure: POLYPECTOMY;  Surgeon: Daneil Dolin, MD;  Location: [facility];  Service: Endoscopy;;  descending colon   SMALL INTESTINE SURGERY      Prior to Admission medications   Medication Sig Start Date End Date Taking? Authorizing Provider  amLODipine (NORVASC) 5 MG tablet Take 5 mg by mouth daily. 09/26/21  Yes [provider]  aspirin EC 81 MG tablet Take 81 mg by mouth daily.   Yes [provider]  atorvastatin (LIPITOR) 10 MG tablet Take 10 mg by mouth 2 (two)  times a week. 09/24/21  Yes [provider]  cholecalciferol (VITAMIN D) 1000 UNITS tablet Take 1,000 Units by mouth daily.   Yes [provider]  fluticasone (FLONASE) 50 MCG/ACT nasal spray Place 2 sprays into both nostrils daily. Patient taking differently: Place 2 sprays into both nostrils as needed. 01/09/20  Yes Antonieta Pert, MD  hydrochlorothiazide (HYDRODIURIL) 25 MG tablet TAKE 1 TABLET BY MOUTH EVERY DAY 06/15/20  Yes [place], Modena Nunnery, MD  losartan (COZAAR) 100 MG tablet TAKE 1 TABLET(100 MG) BY MOUTH DAILY 06/15/20  Yes Garza-Salinas II, Modena Nunnery, MD  metoprolol succinate (TOPROL-XL) 25 MG 24 hr tablet Take 25 mg by mouth daily. 08/12/21  Yes [provider]  predniSONE (DELTASONE) 20 MG tablet Take 2 tablets (40 mg total) by mouth daily. 08/27/21   Vanessa Kick, MD    Allergies as of 11/19/2021 - Review Complete 11/19/2021  Allergen Reaction Noted   Tetanus toxoids Swelling 05/07/2011    Family History  Problem Relation Age of Onset   Cancer Mother        pancratic   Heart attack Mother    Cancer Father        stomach   Diabetes Father    Hyperlipidemia Father    Heart disease Father        NOT  before age 36   Cancer Sister        lung  Hyperlipidemia Sister    Cancer Brother        thyroid, prostate   Hyperlipidemia Brother    Heart attack Brother    Cancer Sister        colon   Hyperlipidemia Sister    Cancer Sister        colon   Hyperlipidemia Sister     Social History   Socioeconomic History   Marital status: Widowed    Spouse name: Not on file   Number of children: Not on file   Years of education: Not on file   Highest education level: Not on file  Occupational History   Not on file  Tobacco Use   Smoking status: Former    Types: Cigarettes    Quit date: 12/22/1996    Years since quitting: 24.9   Smokeless tobacco: Never  Vaping Use   Vaping Use: Never used  Substance and Sexual Activity   Alcohol use: No   Drug use: No    Sexual activity: Not on file  Other Topics Concern   Not on file  Social History Narrative   Not on file   Social Determinants of Health   Financial Resource Strain: Not on file  Food Insecurity: Not on file  Transportation Needs: Not on file  Physical Activity: Not on file  Stress: Not on file  Social Connections: Not on file  Intimate Partner Violence: Not on file    Review of Systems: See HPI, otherwise negative ROS  Physical Exam: BP (!) 170/62   Pulse 70   Temp (!) 96.9 F (36.1 C) (Temporal)   Ht 5\' 3"  (1.6 m)   Wt 152 lb 9.6 oz (69.2 kg)   BMI 27.03 kg/m  General:   Alert,  Well-developed, well-nourished, pleasant and cooperative in NAD Neck:  Supple; no masses or thyromegaly. No significant cervical adenopathy. Lungs:  Clear throughout to auscultation.   No wheezes, crackles, or rhonchi. No acute distress. Heart:  Regular rate and rhythm; no murmurs, clicks, rubs,  or gallops. Abdomen: Non-distended, normal bowel sounds.  Soft and nontender without appreciable mass or hepatosplenomegaly.  Rectal: Deferred until the time of colonoscopy  Impression/Plan: Pleasant 75 years old lady with a history of multiple colonic adenomas removed over time now with intermittent paper hematochezia. Likely anorectal in origin.  However, she needs full evaluation of her lower GI tract to further evaluate.  Recommendations:  To this end, I have offered the patient a diagnostic colonoscopy in the near future. The risks, benefits, limitations, alternatives and imponderables have been reviewed with the patient. Questions have been answered. All parties are agreeable.    ASA 3/propofol.     Notice: This dictation was prepared with Dragon dictation along with smaller phrase technology. Any transcriptional errors that result from this process are unintentional and may not be corrected upon review.

## 2021-11-19 NOTE — Patient Instructions (Signed)
It was good seeing you again today!  As discussed, we will schedule a diagnostic colonoscopy (paper hematochezia and history of polyps).  ASA 3/propofol  Further recommendations to follow.

## 2021-11-20 DIAGNOSIS — I10 Essential (primary) hypertension: Secondary | ICD-10-CM | POA: Diagnosis not present

## 2021-11-22 DIAGNOSIS — I10 Essential (primary) hypertension: Secondary | ICD-10-CM | POA: Diagnosis not present

## 2021-11-22 DIAGNOSIS — R059 Cough, unspecified: Secondary | ICD-10-CM | POA: Diagnosis not present

## 2021-11-22 DIAGNOSIS — Z6827 Body mass index (BMI) 27.0-27.9, adult: Secondary | ICD-10-CM | POA: Diagnosis not present

## 2021-11-22 DIAGNOSIS — Z299 Encounter for prophylactic measures, unspecified: Secondary | ICD-10-CM | POA: Diagnosis not present

## 2021-11-22 DIAGNOSIS — R110 Nausea: Secondary | ICD-10-CM | POA: Diagnosis not present

## 2021-11-26 ENCOUNTER — Telehealth: Payer: Self-pay | Admitting: *Deleted

## 2021-11-26 NOTE — Patient Instructions (Signed)
Stacey Moody  11/26/2021     @PREFPERIOPPHARMACY @   Your procedure is scheduled on  11/28/2021.   Report to [facility] at  1245  P.M.   Call this number if you have problems the morning of surgery:  [phone]   Remember:  Follow the diet and prep instructions given to you by the office.    Take these medicines the morning of surgery with A SIP OF WATER                        amlodipine, zyrtec, metoprolol.     Do not wear jewelry, make-up or nail polish.  Do not wear lotions, powders, or perfumes, or deodorant.  Do not shave 48 hours prior to surgery.  Men may shave face and neck.  Do not bring valuables to the hospital.  [facility] is not responsible for any belongings or valuables.  Contacts, dentures or bridgework may not be worn into surgery.  Leave your suitcase in the car.  After surgery it may be brought to your room.  For patients admitted to the hospital, discharge time will be determined by your treatment team.  Patients discharged the day of surgery will not be allowed to drive home and must have someone with them for 24 hours.    Special instructions:   DO NOT smoke tobacco or vape for 24 hours before your procedure.  Please read over the following fact sheets that you were given. Anesthesia Post-op Instructions and Care and Recovery After Surgery      Colonoscopy, Adult, Care After This sheet gives you information about how to care for yourself after your procedure. Your health care provider may also give you more specific instructions. If you have problems or questions, contact your health care provider. What can I expect after the procedure? After the procedure, it is common to have: A small amount of blood in your stool for 24 hours after the procedure. Some gas. Mild cramping or bloating of your abdomen. Follow these instructions at home: Eating and drinking  Drink enough fluid to keep your urine pale yellow. Follow instructions from  your health care provider about eating or drinking restrictions. Resume your normal diet as instructed by your health care provider. Avoid heavy or fried foods that are hard to digest. Activity Rest as told by your health care provider. Avoid sitting for a long time without moving. Get up to take short walks every 1-2 hours. This is important to improve blood flow and breathing. Ask for help if you feel weak or unsteady. Return to your normal activities as told by your health care provider. Ask your health care provider what activities are safe for you. Managing cramping and bloating  Try walking around when you have cramps or feel bloated. Apply heat to your abdomen as told by your health care provider. Use the heat source that your health care provider recommends, such as a moist heat pack or a heating pad. Place a towel between your skin and the heat source. Leave the heat on for 20-30 minutes. Remove the heat if your skin turns bright red. This is especially important if you are unable to feel pain, heat, or cold. You may have a greater risk of getting burned. General instructions If you were given a sedative during the procedure, it can affect you for several hours. Do not drive or operate machinery until your health care provider says  that it is safe. For the first 24 hours after the procedure: Do not sign important documents. Do not drink alcohol. Do your regular daily activities at a slower pace than normal. Eat soft foods that are easy to digest. Take over-the-counter and prescription medicines only as told by your health care provider. Keep all follow-up visits as told by your health care provider. This is important. Contact a health care provider if: You have blood in your stool 2-3 days after the procedure. Get help right away if you have: More than a small spotting of blood in your stool. Large blood clots in your stool. Swelling of your abdomen. Nausea or vomiting. A  fever. Increasing pain in your abdomen that is not relieved with medicine. Summary After the procedure, it is common to have a small amount of blood in your stool. You may also have mild cramping and bloating of your abdomen. If you were given a sedative during the procedure, it can affect you for several hours. Do not drive or operate machinery until your health care provider says that it is safe. Get help right away if you have a lot of blood in your stool, nausea or vomiting, a fever, or increased pain in your abdomen. This information is not intended to replace advice given to you by your health care provider. Make sure you discuss any questions you have with your health care provider. Document Revised: 10/14/2019 Document Reviewed: 07/04/2019 Elsevier Patient Education  [place] After This sheet gives you information about how to care for yourself after your procedure. Your health care provider may also give you more specific instructions. If you have problems or questions, contact your health care provider. What can I expect after the procedure? After the procedure, it is common to have: Tiredness. Forgetfulness about what happened after the procedure. Impaired judgment for important decisions. Nausea or vomiting. Some difficulty with balance. Follow these instructions at home: For the time period you were told by your health care provider:   Rest as needed. Do not participate in activities where you could fall or become injured. Do not drive or use machinery. Do not drink alcohol. Do not take sleeping pills or medicines that cause drowsiness. Do not make important decisions or sign legal documents. Do not take care of children on your own. Eating and drinking Follow the diet that is recommended by your health care provider. Drink enough fluid to keep your urine pale yellow. If you vomit: Drink water, juice, or soup when you can drink  without vomiting. Make sure you have little or no nausea before eating solid foods. General instructions Have a responsible adult stay with you for the time you are told. It is important to have someone help care for you until you are awake and alert. Take over-the-counter and prescription medicines only as told by your health care provider. If you have sleep apnea, surgery and certain medicines can increase your risk for breathing problems. Follow instructions from your health care provider about wearing your sleep device: Anytime you are sleeping, including during daytime naps. While taking prescription pain medicines, sleeping medicines, or medicines that make you drowsy. Avoid smoking. Keep all follow-up visits as told by your health care provider. This is important. Contact a health care provider if: You keep feeling nauseous or you keep vomiting. You feel light-headed. You are still sleepy or having trouble with balance after 24 hours. You develop a rash. You have a fever. You  have redness or swelling around the IV site. Get help right away if: You have trouble breathing. You have new-onset confusion at home. Summary For several hours after your procedure, you may feel tired. You may also be forgetful and have poor judgment. Have a responsible adult stay with you for the time you are told. It is important to have someone help care for you until you are awake and alert. Rest as told. Do not drive or operate machinery. Do not drink alcohol or take sleeping pills. Get help right away if you have trouble breathing, or if you suddenly become confused. This information is not intended to replace advice given to you by your health care provider. Make sure you discuss any questions you have with your health care provider. Document Revised: 08/23/2020 Document Reviewed: 11/10/2019 Elsevier Patient Education  2022 [employer].

## 2021-11-26 NOTE — Telephone Encounter (Signed)
Called pt. She was agreeable to Dr. [name] performing her procedure on Thursday.

## 2021-11-27 ENCOUNTER — Other Ambulatory Visit: Payer: Self-pay

## 2021-11-27 ENCOUNTER — Encounter (HOSPITAL_COMMUNITY)
Admission: RE | Admit: 2021-11-27 | Discharge: 2021-11-27 | Disposition: A | Discharge status: Home / Self Care | Payer: Medicare Other | Source: Ambulatory Visit | Attending: Internal Medicine | Admitting: Internal Medicine

## 2021-11-27 VITALS — BP 164/53 | HR 77 | Temp 97.3°F | Resp 18 | Ht 63.0 in | Wt 153.0 lb

## 2021-11-27 DIAGNOSIS — D72819 Decreased white blood cell count, unspecified: Secondary | ICD-10-CM | POA: Insufficient documentation

## 2021-11-27 DIAGNOSIS — D61818 Other pancytopenia: Secondary | ICD-10-CM | POA: Diagnosis not present

## 2021-11-27 DIAGNOSIS — Z79899 Other long term (current) drug therapy: Secondary | ICD-10-CM | POA: Diagnosis not present

## 2021-11-27 DIAGNOSIS — Z01812 Encounter for preprocedural laboratory examination: Secondary | ICD-10-CM | POA: Insufficient documentation

## 2021-11-27 LAB — BASIC METABOLIC PANEL
Anion gap: 12 (ref 5–15)
BUN: 17 mg/dL (ref 8–23)
CO2: 22 mmol/L (ref 22–32)
Calcium: 9.4 mg/dL (ref 8.9–10.3)
Chloride: 103 mmol/L (ref 98–111)
Creatinine, Ser: 0.8 mg/dL (ref 0.44–1.00)
GFR, Estimated: >60 mL/min (ref >60)
Glucose, Bld: 99 mg/dL (ref 70–99)
Potassium: 3.3 mmol/L — ABNORMAL LOW (ref 3.5–5.1)
Sodium: 137 mmol/L (ref 135–145)

## 2021-11-27 LAB — CBC WITH DIFFERENTIAL/PLATELET
Abs Immature Granulocytes: 0.04 10*3/uL (ref 0.00–0.07)
Basophils Absolute: 0 10*3/uL (ref 0.0–0.1)
Basophils Relative: 1 %
Eosinophils Absolute: 0.1 10*3/uL (ref 0.0–0.5)
Eosinophils Relative: 1 %
HCT: 35.2 % — ABNORMAL LOW (ref 36.0–46.0)
Hemoglobin: 12.3 g/dL (ref 12.0–15.0)
Immature Granulocytes: 1 %
Lymphocytes Relative: 21 %
Lymphs Abs: 1.7 10*3/uL (ref 0.7–4.0)
MCH: 33.4 pg (ref 26.0–34.0)
MCHC: 34.9 g/dL (ref 30.0–36.0)
MCV: 95.7 fL (ref 80.0–100.0)
Monocytes Absolute: 0.7 10*3/uL (ref 0.1–1.0)
Monocytes Relative: 9 %
Neutro Abs: 5.6 10*3/uL (ref 1.7–7.7)
Neutrophils Relative %: 67 %
Platelets: 253 10*3/uL (ref 150–400)
RBC: 3.68 MIL/uL — ABNORMAL LOW (ref 3.87–5.11)
RDW: 12.4 % (ref 11.5–15.5)
WBC: 8.1 10*3/uL (ref 4.0–10.5)
nRBC: 0 % (ref 0.0–0.2)

## 2021-11-27 MED ORDER — POTASSIUM CHLORIDE CRYS ER 20 MEQ PO TBCR: 40.0000 meq | EXTENDED_RELEASE_TABLET | Freq: Once | ORAL | 0 refills | Status: DC

## 2021-11-28 ENCOUNTER — Encounter (HOSPITAL_COMMUNITY): Payer: Self-pay

## 2021-11-28 ENCOUNTER — Ambulatory Visit (HOSPITAL_COMMUNITY)
Admission: RE | Admit: 2021-11-28 | Discharge: 2021-11-28 | Disposition: A | Discharge status: Home / Self Care | Payer: Medicare Other | Source: Ambulatory Visit | Attending: Internal Medicine | Admitting: Internal Medicine

## 2021-11-28 ENCOUNTER — Ambulatory Visit (HOSPITAL_COMMUNITY): Payer: Medicare Other | Admitting: Anesthesiology

## 2021-11-28 ENCOUNTER — Other Ambulatory Visit: Payer: Self-pay

## 2021-11-28 ENCOUNTER — Encounter (HOSPITAL_COMMUNITY)
Admission: RE | Disposition: A | Discharge status: Home / Self Care | Payer: Self-pay | Source: Ambulatory Visit | Attending: Internal Medicine

## 2021-11-28 DIAGNOSIS — Z79899 Other long term (current) drug therapy: Secondary | ICD-10-CM | POA: Diagnosis not present

## 2021-11-28 DIAGNOSIS — I10 Essential (primary) hypertension: Secondary | ICD-10-CM | POA: Insufficient documentation

## 2021-11-28 DIAGNOSIS — I6529 Occlusion and stenosis of unspecified carotid artery: Secondary | ICD-10-CM | POA: Diagnosis not present

## 2021-11-28 DIAGNOSIS — K625 Hemorrhage of anus and rectum: Secondary | ICD-10-CM | POA: Insufficient documentation

## 2021-11-28 DIAGNOSIS — K5730 Diverticulosis of large intestine without perforation or abscess without bleeding: Secondary | ICD-10-CM | POA: Insufficient documentation

## 2021-11-28 DIAGNOSIS — Z538 Procedure and treatment not carried out for other reasons: Secondary | ICD-10-CM

## 2021-11-28 DIAGNOSIS — K219 Gastro-esophageal reflux disease without esophagitis: Secondary | ICD-10-CM | POA: Diagnosis not present

## 2021-11-28 DIAGNOSIS — K648 Other hemorrhoids: Secondary | ICD-10-CM | POA: Diagnosis not present

## 2021-11-28 HISTORY — PX: FLEXIBLE SIGMOIDOSCOPY: SHX5431

## 2021-11-28 SURGERY — SIGMOIDOSCOPY, FLEXIBLE
Anesthesia: General

## 2021-11-28 MED ORDER — LACTATED RINGERS IV SOLN: INTRAVENOUS | Status: DC

## 2021-11-28 MED ORDER — PROPOFOL 500 MG/50ML IV EMUL
INTRAVENOUS | Status: DC | PRN
  Administered 2021-11-28: 125 ug/kg/min via INTRAVENOUS

## 2021-11-28 MED ORDER — PHENYLEPHRINE HCL (PRESSORS) 10 MG/ML IV SOLN
INTRAVENOUS | Status: DC | PRN
  Administered 2021-11-28: 80 ug via INTRAVENOUS

## 2021-11-28 MED ORDER — PROPOFOL 10 MG/ML IV BOLUS
INTRAVENOUS | Status: DC | PRN
  Administered 2021-11-28 (×3): 50 mg via INTRAVENOUS
  Administered 2021-11-28: 30 mg via INTRAVENOUS
  Administered 2021-11-28: 100 mg via INTRAVENOUS

## 2021-11-28 NOTE — Interval H&P Note (Signed)
History and Physical Interval Note:  11/28/2021 12:36 PM  Stacey Moody  has presented today for surgery, with the diagnosis of hematochezia, hx polyps.  The various methods of treatment have been discussed with the patient and family. After consideration of risks, benefits and other options for treatment, the patient has consented to  Procedure(s) with comments: COLONOSCOPY WITH PROPOFOL (N/A) - 3:00pm as a surgical intervention.  The patient's history has been reviewed, patient examined, no change in status, stable for surgery.  I have reviewed the patient's chart and labs.  Questions were answered to the patient's satisfaction.     [name]

## 2021-11-28 NOTE — Discharge Instructions (Signed)
  Colonoscopy Discharge Instructions  Read the instructions outlined below and refer to this sheet in the next few weeks. These discharge instructions provide you with general information on caring for yourself after you leave the hospital. Your doctor may also give you specific instructions. While your treatment has been planned according to the most current medical practices available, unavoidable complications occasionally occur.   ACTIVITY You may resume your regular activity, but move at a slower pace for the next 24 hours.  Take frequent rest periods for the next 24 hours.  Walking will help get rid of the air and reduce the bloated feeling in your belly (abdomen).  No driving for 24 hours (because of the medicine (anesthesia) used during the test).   Do not sign any important legal documents or operate any machinery for 24 hours (because of the anesthesia used during the test).  NUTRITION Drink plenty of fluids.  You may resume your normal diet as instructed by your doctor.  Begin with a light meal and progress to your normal diet. Heavy or fried foods are harder to digest and may make you feel sick to your stomach (nauseated).  Avoid alcoholic beverages for 24 hours or as instructed.  MEDICATIONS You may resume your normal medications unless your doctor tells you otherwise.  WHAT YOU CAN EXPECT TODAY Some feelings of bloating in the abdomen.  Passage of more gas than usual.  Spotting of blood in your stool or on the toilet paper.  IF YOU HAD POLYPS REMOVED DURING THE COLONOSCOPY: No aspirin products for 7 days or as instructed.  No alcohol for 7 days or as instructed.  Eat a soft diet for the next 24 hours.  FINDING OUT THE RESULTS OF YOUR TEST Not all test results are available during your visit. If your test results are not back during the visit, make an appointment with your caregiver to find out the results. Do not assume everything is normal if you have not heard from your  caregiver or the medical facility. It is important for you to follow up on all of your test results.  SEEK IMMEDIATE MEDICAL ATTENTION IF: You have more than a spotting of blood in your stool.  Your belly is swollen (abdominal distention).  You are nauseated or vomiting.  You have a temperature over 101.  You have abdominal pain or discomfort that is severe or gets worse throughout the day.   Unfortunately, the anatomy of your sigmoid colon is very distorted, likely from previous abdominal surgeries/adhesions.  I was unable to advance the colonoscope past this region despite changing to a much smaller scope.  I will discuss case further with Dr. Gala Romney to decide what would be the next best step.  We will be in touch.  I hope you have a great rest of your week!  Stacey Moody. [name], D.O. Gastroenterology and Hepatology [facility]

## 2021-11-28 NOTE — Anesthesia Preprocedure Evaluation (Addendum)
Anesthesia Evaluation  Patient identified by MRN, date of birth, ID band Patient awake    Reviewed: Allergy & Precautions, H&P , NPO status , Patient's Chart, lab work & pertinent test results, reviewed documented beta blocker date and time   History of Anesthesia Complications Negative for: history of anesthetic complications  Airway Mallampati: II  TM Distance: >3 FB Neck ROM: Full    Dental  (+) Dental Advisory Given, Caps   Pulmonary pneumonia, former smoker,    Pulmonary exam normal breath sounds clear to auscultation       Cardiovascular Exercise Tolerance: Good hypertension, Pt. on home beta blockers and Pt. on medications + angina + Peripheral Vascular Disease (right renal artery stenosis, carotid artery stenosis)  Normal cardiovascular exam Rhythm:Regular Rate:Normal     Neuro/Psych negative neurological ROS  negative psych ROS   GI/Hepatic Neg liver ROS, GERD  Medicated,  Endo/Other  negative endocrine ROS  Renal/GU negative Renal ROS  negative genitourinary   Musculoskeletal negative musculoskeletal ROS (+)   Abdominal   Peds negative pediatric ROS (+)  Hematology  (+) Blood dyscrasia, anemia ,   Anesthesia Other Findings   Reproductive/Obstetrics negative OB ROS                            Anesthesia Physical Anesthesia Plan  ASA: 2  Anesthesia Plan: General   Post-op Pain Management: Minimal or no pain anticipated   Induction: Intravenous  PONV Risk Score and Plan: TIVA  Airway Management Planned: Nasal Cannula and Natural Airway  Additional Equipment:   Intra-op Plan:   Post-operative Plan:   Informed Consent: I have reviewed the patients History and Physical, chart, labs and discussed the procedure including the risks, benefits and alternatives for the proposed anesthesia with the patient or authorized representative who has indicated his/her understanding  and acceptance.     Dental advisory given  Plan Discussed with: CRNA and Surgeon  Anesthesia Plan Comments:        Anesthesia Quick Evaluation

## 2021-11-28 NOTE — Anesthesia Postprocedure Evaluation (Signed)
Anesthesia Post Note  Patient: Stacey Moody  Procedure(s) Performed: [place]  Patient location during evaluation: Phase II Anesthesia Type: General Level of consciousness: awake and alert and oriented Pain management: pain level controlled Vital Signs Assessment: post-procedure vital signs reviewed and stable Respiratory status: spontaneous breathing, nonlabored ventilation and respiratory function stable Cardiovascular status: blood pressure returned to baseline and stable Postop Assessment: no apparent nausea or vomiting Anesthetic complications: no   No notable events documented.   Last Vitals:  Vitals:   11/28/21 1129 11/28/21 1323  BP: (!) 152/61   Pulse:  63  Resp: 12 16  Temp: 36.9 C 36.4 C  SpO2: 98% 99%    Last Pain:  Vitals:   11/28/21 1323  TempSrc: Axillary  PainSc: 0-No pain                 [name] C [name]

## 2021-11-28 NOTE — Transfer of Care (Signed)
Immediate Anesthesia Transfer of Care Note  Patient: Stacey Moody  Procedure(s) Performed: FLEXIBLE SIGMOIDOSCOPY  Patient Location: Short Stay  Anesthesia Type:General  Level of Consciousness: awake  Airway & Oxygen Therapy: Patient Spontanous Breathing  Post-op Assessment: Report given to RN and Post -op Vital signs reviewed and stable  Post vital signs: Reviewed and stable  Last Vitals:  Vitals Value Taken Time  BP    Temp    Pulse    Resp    SpO2      Last Pain:  Vitals:   11/28/21 1239  TempSrc:   PainSc: 0-No pain      Patients Stated Pain Goal: 7 (41/93/79 0240)  Complications: No notable events documented.

## 2021-11-28 NOTE — Op Note (Signed)
[facility] Patient Name: Stacey Moody Procedure Date: 11/28/2021 11:41 AM MRN: [id number] Date of Birth: [birth year] Attending MD: [name] [name] DO CSN: [id number] Age: 75 Admit Type: Outpatient Procedure:                Colonoscopy Indications:              Rectal bleeding Providers:                [name] [name], DO, Jessica Boudreaux, Tammy                            Vaught, RN, [employer]. Risa Grill, Technician Referring MD:              Medicines:                See the Anesthesia note for documentation of the                            administered medications Complications:            No immediate complications. Estimated Blood Loss:     Estimated blood loss: none. Procedure:                Pre-Anesthesia Assessment:                           - The anesthesia plan was to use monitored                            anesthesia care (MAC).                           After obtaining informed consent, the colonoscope                            was passed under direct vision. Throughout the                            procedure, the patient's blood pressure, pulse, and                            oxygen saturations were monitored continuously. The                            PCF-HQ190L ([id number]) scope was introduced through                            the anus with the intention of advancing to the                            cecum. The scope was advanced to the sigmoid colon                            before the procedure was aborted. Medications were                            given. The colonoscopy  was technically difficult                            and complex due to restricted mobility of the                            colon. The patient tolerated the procedure well. Scope In: 12:42:10 PM Scope Out: 1:17:56 PM Total Procedure Duration: 0 hours 35 minutes 46 seconds  Findings:      The perianal and digital rectal examinations were normal.      Non-bleeding internal  hemorrhoids were found during endoscopy.      Approx 20-25 cm into the sigmoid colon, evidence of tight turn and       significant diverticular disease. Unable to advance scope through. I       switched to ultra slim colonoscope and was still unable to advance       despite placing patient supine. Procedure was the aborted.      Multiple small-mouthed diverticula were found in the sigmoid colon. Impression:               - Non-bleeding internal hemorrhoids.                           - Diverticulosis in the sigmoid colon.                           - No specimens collected. Moderate Sedation:      Per Anesthesia Care Recommendation:           - Patient has a contact number available for                            emergencies. The signs and symptoms of potential                            delayed complications were discussed with the                            patient. Return to normal activities tomorrow.                            Written discharge instructions were provided to the                            patient.                           - Resume previous diet.                           - Continue present medications.                           - I will discuss case further with Dr. Gala Romney her                            primary GI as to next steps. Procedure Code(s):        ---  Professional ---                           [phone], 53, Colonoscopy, flexible; diagnostic,                            including collection of specimen(s) by brushing or                            washing, when performed (separate procedure) Diagnosis Code(s):        --- Professional ---                           K64.8, Other hemorrhoids                           K62.5, Hemorrhage of anus and rectum CPT copyright 2019 American Medical Association. All rights reserved. The codes documented in this report are preliminary and upon coder review may  be revised to meet current compliance requirements. [name]  [name], DO [place] [name], DO 11/28/2021 1:25:36 PM This report has been signed electronically. Number of Addenda: 0

## 2021-12-05 ENCOUNTER — Encounter (HOSPITAL_COMMUNITY): Payer: Self-pay | Admitting: Internal Medicine

## 2021-12-05 ENCOUNTER — Other Ambulatory Visit: Payer: Self-pay

## 2021-12-05 ENCOUNTER — Ambulatory Visit
Admission: EM | Admit: 2021-12-05 | Discharge: 2021-12-05 | Disposition: A | Discharge status: Home / Self Care | Payer: Medicare Other | Attending: Urgent Care | Admitting: Urgent Care

## 2021-12-05 DIAGNOSIS — Z20822 Contact with and (suspected) exposure to covid-19: Secondary | ICD-10-CM

## 2021-12-05 DIAGNOSIS — B349 Viral infection, unspecified: Secondary | ICD-10-CM | POA: Diagnosis not present

## 2021-12-05 DIAGNOSIS — R52 Pain, unspecified: Secondary | ICD-10-CM | POA: Diagnosis not present

## 2021-12-05 DIAGNOSIS — R052 Subacute cough: Secondary | ICD-10-CM

## 2021-12-05 MED ORDER — OSELTAMIVIR PHOSPHATE 75 MG PO CAPS: 75.0000 mg | ORAL_CAPSULE | Freq: Two times a day (BID) | ORAL | 0 refills | Status: DC

## 2021-12-05 MED ORDER — PROMETHAZINE-DM 6.25-15 MG/5ML PO SYRP: 5.0000 mL | ORAL_SOLUTION | Freq: Every evening | ORAL | 0 refills | Status: DC | PRN

## 2021-12-05 MED ORDER — BENZONATATE 100 MG PO CAPS: 100.0000 mg | ORAL_CAPSULE | Freq: Three times a day (TID) | ORAL | 0 refills | Status: DC | PRN

## 2021-12-05 NOTE — Discharge Instructions (Addendum)
We will notify you of your test results as they arrive and may take between 48-72 hours.  I encourage you to sign up for MyChart if you have not already done so as this can be the easiest way for [place] to communicate results to you online or through a phone app.  Generally, we only contact you if it is a positive test result.  In the meantime, if you develop worsening symptoms including fever, chest pain, shortness of breath despite our current treatment plan then please report to the emergency room as this may be a sign of worsening status from possible viral infection.  Otherwise, we will manage this as a viral syndrome as influenza with Tamiflu. For sore throat or cough try using a honey-based tea. Use 3 teaspoons of honey with juice squeezed from half lemon. Place shaved pieces of ginger into 1/2-1 cup of water and warm over stove top. Then mix the ingredients and repeat every 4 hours as needed. Please take Tylenol 500mg -650mg  every 6 hours for aches and pains, fevers. Hydrate very well with at least 2 liters of water. Eat light meals such as soups to replenish electrolytes and soft fruits, veggies. Start an antihistamine like Zyrtec for postnasal drainage, sinus congestion.  You can take this together with pseudoephedrine (Sudafed) at a dose of 30 mg 2-3 times a day as needed for the same kind of congestion.  Use the cough medications as needed.

## 2021-12-05 NOTE — ED Provider Notes (Signed)
[place]   MRN: [id number] DOB: [birth year]  Subjective:   Stacey Moody is a 75 y.o. female presenting for 1 day history of acute onset worsening body aches, coughing, fevers and chills.  No sick contacts to her knowledge.  No chest pain, shortness of breath or wheezing.  No history of respiratory disorders.  She has not been able to get her flu vaccination this season but she has tried twice with her PCP.  No current facility-administered medications for this encounter.  Current Outpatient Medications:    amLODipine (NORVASC) 5 MG tablet, Take 5 mg by mouth daily., Disp: , Rfl:    aspirin EC 81 MG tablet, Take 81 mg by mouth daily., Disp: , Rfl:    atorvastatin (LIPITOR) 10 MG tablet, Take 10 mg by mouth 2 (two) times a week., Disp: , Rfl:    cetirizine (ZYRTEC) 10 MG tablet, Take 10 mg by mouth daily as needed for allergies., Disp: , Rfl:    cholecalciferol (VITAMIN D) 1000 UNITS tablet, Take 1,000 Units by mouth daily., Disp: , Rfl:    fluticasone (FLONASE) 50 MCG/ACT nasal spray, Place 2 sprays into both nostrils daily. (Patient taking differently: Place 2 sprays into both nostrils as needed.), Disp: 16 g, Rfl: 6   hydrochlorothiazide (HYDRODIURIL) 25 MG tablet, TAKE 1 TABLET BY MOUTH EVERY DAY, Disp: 90 tablet, Rfl: 2   losartan (COZAAR) 100 MG tablet, TAKE 1 TABLET(100 MG) BY MOUTH DAILY, Disp: 90 tablet, Rfl: 2   metoprolol succinate (TOPROL-XL) 25 MG 24 hr tablet, Take 25 mg by mouth daily., Disp: , Rfl:    polyethylene glycol-electrolytes (NULYTELY) 420 g solution, As directed, Disp: 4000 mL, Rfl: 0   potassium chloride SA (KLOR-CON M) 20 MEQ tablet, Take 2 tablets (40 mEq total) by mouth once for 1 dose., Disp: 4 tablet, Rfl: 0   Allergies  Allergen Reactions   Tetanus Toxoids Swelling    Past Medical History:  Diagnosis Date   Carotid artery stenosis    GERD (gastroesophageal reflux disease)    Hyperlipidemia    Hypertension    Influenza A 01/01/2014    Osteopenia    T-2.3   Right renal artery stenosis (HCC)    Syncope 12/31/2013   Vasovagal or hypovolemia/orthostatic     Past Surgical History:  Procedure Laterality Date   ABDOMINAL HYSTERECTOMY  1982   Partial hysterectomy   COLONOSCOPY N/A 07/30/2016   Procedure: COLONOSCOPY;  Surgeon: Daneil Dolin, MD;  [facility];  Service: Endoscopy;  Laterality: N/A;  8:30 AM   CT SCAN  July 2015   Chest   FLEXIBLE SIGMOIDOSCOPY  11/28/2021   Procedure: FLEXIBLE SIGMOIDOSCOPY;  Surgeon: [name], DO;  [facility];  Service: Endoscopy;;   LEFT HEART CATH AND CORONARY ANGIOGRAPHY N/A 01/09/2020   Procedure: LEFT HEART CATH AND CORONARY ANGIOGRAPHY;  Surgeon: Nelva Bush, MD;  Location: [place] [facility];  Service: Cardiovascular;  Laterality: N/A;   POLYPECTOMY  07/30/2016   Procedure: POLYPECTOMY;  Surgeon: Daneil Dolin, MD;  [facility];  Service: Endoscopy;;  descending colon   SMALL INTESTINE SURGERY      Family History  Problem Relation Age of Onset   Cancer Mother        pancratic   Heart attack Mother    Cancer Father        stomach   Diabetes Father    Hyperlipidemia Father    Heart disease Father  NOT  before age 3   Cancer Sister        lung   Hyperlipidemia Sister    Cancer Brother        thyroid, prostate   Hyperlipidemia Brother    Heart attack Brother    Cancer Sister        colon   Hyperlipidemia Sister    Cancer Sister        colon   Hyperlipidemia Sister     Social History   Tobacco Use   Smoking status: Former    Types: Cigarettes    Quit date: 12/22/1996    Years since quitting: 24.9   Smokeless tobacco: Never  Vaping Use   Vaping Use: Never used  Substance Use Topics   Alcohol use: No   Drug use: No    ROS   Objective:   Vitals: BP (!) 159/68 (BP Location: Right Arm)    Pulse 79    Temp 99 F (37.2 C) (Oral)    Resp 18    SpO2 95%   Physical Exam Constitutional:      General: She  is not in acute distress.    Appearance: Normal appearance. She is well-developed. She is not ill-appearing, toxic-appearing or diaphoretic.  HENT:     Head: Normocephalic and atraumatic.     Nose: Nose normal.     Mouth/Throat:     Mouth: Mucous membranes are moist.  Eyes:     Extraocular Movements: Extraocular movements intact.     Pupils: Pupils are equal, round, and reactive to light.  Cardiovascular:     Rate and Rhythm: Normal rate and regular rhythm.     Pulses: Normal pulses.     Heart sounds: Normal heart sounds. No murmur heard.   No friction rub. No gallop.  Pulmonary:     Effort: Pulmonary effort is normal. No respiratory distress.     Breath sounds: Normal breath sounds. No stridor. No wheezing, rhonchi or rales.  Skin:    General: Skin is warm and dry.     Findings: No rash.  Neurological:     Mental Status: She is alert and oriented to person, place, and time.  Psychiatric:        Mood and Affect: Mood normal.        Behavior: Behavior normal.        Thought Content: Thought content normal.      Assessment and Plan :   PDMP not reviewed this encounter.  1. Acute viral syndrome   2. Exposure to COVID-19 virus   3. Body aches   4. Subacute cough    Deferred imaging given clear cardiopulmonary exam, hemodynamically stable vital signs. Will cover for influenza with Tamiflu given fever, symptom set, current incidence in the community.  Use supportive care, rest, fluids, hydration, light meals, schedule Tylenol and ibuprofen. Counseled patient on potential for adverse effects with medications prescribed today, patient verbalized understanding. ER and return-to-clinic precautions discussed, patient verbalized understanding.    Jaynee Eagles, [place] 12/05/21 8032

## 2021-12-05 NOTE — ED Triage Notes (Signed)
Chills, body aches, cough, headache, symptoms started yesterday.

## 2021-12-06 LAB — COVID-19, FLU A+B NAA
Influenza A, NAA: DETECTED — AB
Influenza B, NAA: NOT DETECTED
SARS-CoV-2, NAA: NOT DETECTED

## 2021-12-20 DIAGNOSIS — I10 Essential (primary) hypertension: Secondary | ICD-10-CM | POA: Diagnosis not present

## 2022-01-19 DIAGNOSIS — I10 Essential (primary) hypertension: Secondary | ICD-10-CM | POA: Diagnosis not present

## 2022-02-18 DIAGNOSIS — I10 Essential (primary) hypertension: Secondary | ICD-10-CM | POA: Diagnosis not present

## 2022-02-28 DIAGNOSIS — I10 Essential (primary) hypertension: Secondary | ICD-10-CM | POA: Diagnosis not present

## 2022-02-28 DIAGNOSIS — Z299 Encounter for prophylactic measures, unspecified: Secondary | ICD-10-CM | POA: Diagnosis not present

## 2022-02-28 DIAGNOSIS — M79606 Pain in leg, unspecified: Secondary | ICD-10-CM | POA: Diagnosis not present

## 2022-03-18 ENCOUNTER — Other Ambulatory Visit: Payer: Self-pay

## 2022-03-18 ENCOUNTER — Ambulatory Visit
Admission: EM | Admit: 2022-03-18 | Discharge: 2022-03-18 | Disposition: A | Discharge status: Home / Self Care | Payer: Medicare Other | Attending: Urgent Care | Admitting: Urgent Care

## 2022-03-18 DIAGNOSIS — J069 Acute upper respiratory infection, unspecified: Secondary | ICD-10-CM

## 2022-03-18 DIAGNOSIS — R052 Subacute cough: Secondary | ICD-10-CM | POA: Diagnosis not present

## 2022-03-18 DIAGNOSIS — R0981 Nasal congestion: Secondary | ICD-10-CM | POA: Diagnosis not present

## 2022-03-18 MED ORDER — PSEUDOEPHEDRINE HCL 30 MG PO TABS: 30.0000 mg | ORAL_TABLET | Freq: Three times a day (TID) | ORAL | 0 refills | Status: DC | PRN

## 2022-03-18 MED ORDER — CETIRIZINE HCL 5 MG PO TABS: 5.0000 mg | ORAL_TABLET | Freq: Every day | ORAL | 0 refills | Status: DC

## 2022-03-18 MED ORDER — BENZONATATE 100 MG PO CAPS: 100.0000 mg | ORAL_CAPSULE | Freq: Three times a day (TID) | ORAL | 0 refills | Status: DC | PRN

## 2022-03-18 MED ORDER — PROMETHAZINE-DM 6.25-15 MG/5ML PO SYRP: 2.5000 mL | ORAL_SOLUTION | Freq: Three times a day (TID) | ORAL | 0 refills | Status: DC | PRN

## 2022-03-18 NOTE — ED Triage Notes (Signed)
Pt presents with c/o nasal congestion and chills that began this morning  ?

## 2022-03-18 NOTE — ED Provider Notes (Signed)
?[place] ? ? ?MRN: [id number] DOB: [birth year] ? ?Subjective:  ? ?Stacey Moody is a 76 y.o. female presenting for acute onset this morning of chills, sinus congestion, coughing, bilateral ear popping, malaise. No chest pain, shob, wheezing, ear pain, throat pain.  ? ?No current facility-administered medications for this encounter. ? ?Current Outpatient Medications:  ?  amLODipine (NORVASC) 5 MG tablet, Take 5 mg by mouth daily., Disp: , Rfl:  ?  aspirin EC 81 MG tablet, Take 81 mg by mouth daily., Disp: , Rfl:  ?  atorvastatin (LIPITOR) 10 MG tablet, Take 10 mg by mouth 2 (two) times a week., Disp: , Rfl:  ?  benzonatate (TESSALON) 100 MG capsule, Take 1-2 capsules (100-200 mg total) by mouth 3 (three) times daily as needed for cough., Disp: 60 capsule, Rfl: 0 ?  cetirizine (ZYRTEC) 10 MG tablet, Take 10 mg by mouth daily as needed for allergies., Disp: , Rfl:  ?  cholecalciferol (VITAMIN D) 1000 UNITS tablet, Take 1,000 Units by mouth daily., Disp: , Rfl:  ?  fluticasone (FLONASE) 50 MCG/ACT nasal spray, Place 2 sprays into both nostrils daily. (Patient taking differently: Place 2 sprays into both nostrils as needed.), Disp: 16 g, Rfl: 6 ?  hydrochlorothiazide (HYDRODIURIL) 25 MG tablet, TAKE 1 TABLET BY MOUTH EVERY DAY, Disp: 90 tablet, Rfl: 2 ?  losartan (COZAAR) 100 MG tablet, TAKE 1 TABLET(100 MG) BY MOUTH DAILY, Disp: 90 tablet, Rfl: 2 ?  metoprolol succinate (TOPROL-XL) 25 MG 24 hr tablet, Take 25 mg by mouth daily., Disp: , Rfl:  ?  oseltamivir (TAMIFLU) 75 MG capsule, Take 1 capsule (75 mg total) by mouth 2 (two) times daily., Disp: 10 capsule, Rfl: 0 ?  polyethylene glycol-electrolytes (NULYTELY) 420 g solution, As directed, Disp: 4000 mL, Rfl: 0 ?  potassium chloride SA (KLOR-CON M) 20 MEQ tablet, Take 2 tablets (40 mEq total) by mouth once for 1 dose., Disp: 4 tablet, Rfl: 0 ?  promethazine-dextromethorphan (PROMETHAZINE-DM) 6.25-15 MG/5ML syrup, Take 5 mLs by mouth at bedtime as  needed for cough., Disp: 100 mL, Rfl: 0  ? ?Allergies  ?Allergen Reactions  ? Tetanus Toxoids Swelling  ? ? ?Past Medical History:  ?Diagnosis Date  ? Carotid artery stenosis   ? GERD (gastroesophageal reflux disease)   ? Hyperlipidemia   ? Hypertension   ? Influenza A 01/01/2014  ? Osteopenia   ? T-2.3  ? Right renal artery stenosis (HCC)   ? Syncope 12/31/2013  ? Vasovagal or hypovolemia/orthostatic  ?  ? ?Past Surgical History:  ?Procedure Laterality Date  ? ABDOMINAL HYSTERECTOMY  1982  ? Partial hysterectomy  ? COLONOSCOPY N/A 07/30/2016  ? Procedure: COLONOSCOPY;  Surgeon: Daneil Dolin, MD;  [facility];  Service: Endoscopy;  Laterality: N/A;  8:30 AM  ? CT SCAN  July 2015  ? Chest  ? FLEXIBLE SIGMOIDOSCOPY  11/28/2021  ? Procedure: FLEXIBLE SIGMOIDOSCOPY;  Surgeon: [name], DO;  [facility];  Service: Endoscopy;;  ? LEFT HEART CATH AND CORONARY ANGIOGRAPHY N/A 01/09/2020  ? Procedure: LEFT HEART CATH AND CORONARY ANGIOGRAPHY;  Surgeon: Nelva Bush, MD;  Location: [place] [facility];  Service: Cardiovascular;  Laterality: N/A;  ? POLYPECTOMY  07/30/2016  ? Procedure: POLYPECTOMY;  Surgeon: Daneil Dolin, MD;  [facility];  Service: Endoscopy;;  descending colon  ? SMALL INTESTINE SURGERY    ? ? ?Family History  ?Problem Relation Age of Onset  ? Cancer Mother   ?  pancratic  ? Heart attack Mother   ? Cancer Father   ?     stomach  ? Diabetes Father   ? Hyperlipidemia Father   ? Heart disease Father   ?     NOT  before age 58  ? Cancer Sister   ?     lung  ? Hyperlipidemia Sister   ? Cancer Brother   ?     thyroid, prostate  ? Hyperlipidemia Brother   ? Heart attack Brother   ? Cancer Sister   ?     colon  ? Hyperlipidemia Sister   ? Cancer Sister   ?     colon  ? Hyperlipidemia Sister   ? ? ?Social History  ? ?Tobacco Use  ? Smoking status: Former  ?  Types: Cigarettes  ?  Quit date: 12/22/1996  ?  Years since quitting: 25.2  ? Smokeless tobacco: Never  ?Vaping Use   ? Vaping Use: Never used  ?Substance Use Topics  ? Alcohol use: No  ? Drug use: No  ? ? ?ROS ? ? ?Objective:  ? ?Vitals: ?BP (!) 151/70   Pulse 83   Temp 98.4 ?F (36.9 ?C)   Resp 18   SpO2 97%  ? ?Physical Exam ?Constitutional:   ?   General: She is not in acute distress. ?   Appearance: Normal appearance. She is well-developed. She is not ill-appearing, toxic-appearing or diaphoretic.  ?HENT:  ?   Head: Normocephalic and atraumatic.  ?   Right Ear: Tympanic membrane, ear canal and external ear normal. No tenderness. There is no impacted cerumen. Tympanic membrane is not injected, perforated, erythematous or bulging.  ?   Left Ear: Tympanic membrane, ear canal and external ear normal. No tenderness. There is no impacted cerumen. Tympanic membrane is not injected, perforated, erythematous or bulging.  ?   Nose: Congestion present. No rhinorrhea.  ?   Mouth/Throat:  ?   Mouth: Mucous membranes are moist.  ?   Pharynx: No oropharyngeal exudate or posterior oropharyngeal erythema.  ?Eyes:  ?   General: No scleral icterus.    ?   Right eye: No discharge.     ?   Left eye: No discharge.  ?   Extraocular Movements: Extraocular movements intact.  ?   Conjunctiva/sclera: Conjunctivae normal.  ?Cardiovascular:  ?   Rate and Rhythm: Normal rate.  ?   Heart sounds: No murmur heard. ?  No friction rub. No gallop.  ?Pulmonary:  ?   Effort: Pulmonary effort is normal. No respiratory distress.  ?   Breath sounds: No stridor. No wheezing, rhonchi or rales.  ?Chest:  ?   Chest wall: No tenderness.  ?Skin: ?   General: Skin is warm and dry.  ?Neurological:  ?   General: No focal deficit present.  ?   Mental Status: She is alert and oriented to person, place, and time.  ?Psychiatric:     ?   Mood and Affect: Mood normal.     ?   Behavior: Behavior normal.  ? ? ?Assessment and Plan :  ? ?PDMP not reviewed this encounter. ? ?1. Viral upper respiratory illness   ?2. Sinus congestion   ?3. Subacute cough   ? ?Patient had COVID 19 and  flu at the end of 2022. Will defer testing as such. Deferred imaging given clear cardiopulmonary exam, hemodynamically stable vital signs. Suspect viral URI, viral syndrome. Physical exam findings reassuring and vital signs stable for discharge.  Advised supportive care, offered symptomatic relief. Counseled patient on potential for adverse effects with medications prescribed/recommended today, ER and return-to-clinic precautions discussed, patient verbalized understanding.  ? ?  ?Jaynee Eagles, PA-C ?03/18/22 1221 ? ?

## 2022-03-19 ENCOUNTER — Ambulatory Visit: Payer: Self-pay

## 2022-03-20 DIAGNOSIS — I10 Essential (primary) hypertension: Secondary | ICD-10-CM | POA: Diagnosis not present

## 2022-04-08 DIAGNOSIS — J069 Acute upper respiratory infection, unspecified: Secondary | ICD-10-CM | POA: Diagnosis not present

## 2022-04-08 DIAGNOSIS — I10 Essential (primary) hypertension: Secondary | ICD-10-CM | POA: Diagnosis not present

## 2022-04-08 DIAGNOSIS — Z6827 Body mass index (BMI) 27.0-27.9, adult: Secondary | ICD-10-CM | POA: Diagnosis not present

## 2022-04-08 DIAGNOSIS — Z713 Dietary counseling and surveillance: Secondary | ICD-10-CM | POA: Diagnosis not present

## 2022-04-08 DIAGNOSIS — Z299 Encounter for prophylactic measures, unspecified: Secondary | ICD-10-CM | POA: Diagnosis not present

## 2022-04-14 ENCOUNTER — Other Ambulatory Visit (HOSPITAL_COMMUNITY): Payer: Self-pay | Admitting: Internal Medicine

## 2022-04-14 DIAGNOSIS — Z1231 Encounter for screening mammogram for malignant neoplasm of breast: Secondary | ICD-10-CM

## 2022-04-20 DIAGNOSIS — I10 Essential (primary) hypertension: Secondary | ICD-10-CM | POA: Diagnosis not present

## 2022-04-25 ENCOUNTER — Ambulatory Visit (HOSPITAL_COMMUNITY)
Admission: RE | Admit: 2022-04-25 | Discharge: 2022-04-25 | Disposition: A | Discharge status: Home / Self Care | Payer: Medicare Other | Source: Ambulatory Visit | Attending: Internal Medicine | Admitting: Internal Medicine

## 2022-04-25 DIAGNOSIS — Z1231 Encounter for screening mammogram for malignant neoplasm of breast: Secondary | ICD-10-CM | POA: Diagnosis not present

## 2022-05-20 DIAGNOSIS — I10 Essential (primary) hypertension: Secondary | ICD-10-CM | POA: Diagnosis not present

## 2022-05-30 DIAGNOSIS — Z79899 Other long term (current) drug therapy: Secondary | ICD-10-CM | POA: Diagnosis not present

## 2022-05-30 DIAGNOSIS — Z6827 Body mass index (BMI) 27.0-27.9, adult: Secondary | ICD-10-CM | POA: Diagnosis not present

## 2022-05-30 DIAGNOSIS — Z1339 Encounter for screening examination for other mental health and behavioral disorders: Secondary | ICD-10-CM | POA: Diagnosis not present

## 2022-05-30 DIAGNOSIS — Z7189 Other specified counseling: Secondary | ICD-10-CM | POA: Diagnosis not present

## 2022-05-30 DIAGNOSIS — R252 Cramp and spasm: Secondary | ICD-10-CM | POA: Diagnosis not present

## 2022-05-30 DIAGNOSIS — I700 Atherosclerosis of aorta: Secondary | ICD-10-CM | POA: Diagnosis not present

## 2022-05-30 DIAGNOSIS — Z0000 Encounter for general adult medical examination without abnormal findings: Secondary | ICD-10-CM | POA: Diagnosis not present

## 2022-05-30 DIAGNOSIS — Z299 Encounter for prophylactic measures, unspecified: Secondary | ICD-10-CM | POA: Diagnosis not present

## 2022-05-30 DIAGNOSIS — R5383 Other fatigue: Secondary | ICD-10-CM | POA: Diagnosis not present

## 2022-05-30 DIAGNOSIS — I10 Essential (primary) hypertension: Secondary | ICD-10-CM | POA: Diagnosis not present

## 2022-05-30 DIAGNOSIS — Z1331 Encounter for screening for depression: Secondary | ICD-10-CM | POA: Diagnosis not present

## 2022-06-13 DIAGNOSIS — E2839 Other primary ovarian failure: Secondary | ICD-10-CM | POA: Diagnosis not present

## 2022-06-19 DIAGNOSIS — I10 Essential (primary) hypertension: Secondary | ICD-10-CM | POA: Diagnosis not present

## 2022-06-20 ENCOUNTER — Encounter: Payer: Self-pay | Admitting: Physician Assistant

## 2022-06-20 ENCOUNTER — Telehealth: Payer: Self-pay | Admitting: Internal Medicine

## 2022-06-20 NOTE — Telephone Encounter (Signed)
Per Dr. Lorenso Courier patient has been accepted to the practice will need to schedule an office visit. Called patient to schedule left voicemail.

## 2022-07-16 ENCOUNTER — Ambulatory Visit
Admission: EM | Admit: 2022-07-16 | Discharge: 2022-07-16 | Disposition: A | Discharge status: Home / Self Care | Payer: Medicare Other | Attending: Nurse Practitioner | Admitting: Nurse Practitioner

## 2022-07-16 ENCOUNTER — Encounter: Payer: Self-pay | Admitting: Emergency Medicine

## 2022-07-16 ENCOUNTER — Other Ambulatory Visit: Payer: Self-pay

## 2022-07-16 DIAGNOSIS — L03114 Cellulitis of left upper limb: Secondary | ICD-10-CM

## 2022-07-16 MED ORDER — CEPHALEXIN 500 MG PO CAPS: 500.0000 mg | ORAL_CAPSULE | Freq: Four times a day (QID) | ORAL | 0 refills | Status: AC

## 2022-07-16 MED ORDER — MUPIROCIN 2 % EX OINT: 1.0000 | TOPICAL_OINTMENT | Freq: Two times a day (BID) | CUTANEOUS | 0 refills | Status: DC

## 2022-07-16 NOTE — Discharge Instructions (Signed)
-   Please clean your wound twice daily with a mild soap and water, keep covered with a nonadherent dressing.  You can use Bactroban ointment over top of the open area. -Start the Keflex 4 times daily for 5 days to help treat surrounding infection -If symptoms persist or worsen despite treatment, please seek care

## 2022-07-16 NOTE — Progress Notes (Addendum)
07/17/2022 Stacey Moody [id number] October 03, 1946  Referring provider: Medicine, Stacey Moody Internal Primary GI doctor: Dr. Lorenso Moody (Dr. Gala Moody at [facility])  ASSESSMENT AND PLAN:   History of adenomatous polyp of colon with history of AB surgery in 1987 for "kink" and hysterectomy 1981, diverticulosis presents for incomplete colonoscopy.  Had 8 mm TA in 2017, has family history of colon cancer in 2 sister's 40/s/50's, had 1 episode of rectal bleeding  11/2021 colonoscopy with Dr. Abbey Moody For rectal bleeding showed nonbleeding internal hemorrhoids 20-25 cm in the sigmoid colon evidence of tight turn and significant diverticular disease switch to ultraslim colonoscopy and still unable to advance despite patient being benign colonoscopy was aborted. Possible from internal hemorrhoids, but with history listed above would be better if we can attempt a colonoscopy, patient is agreeable. If we are still unable to perform colonoscopy, will set up virtual colonoscopy CT scan  Gastroesophageal reflux disease without esophagitis she reports symptoms are currently well controlled. Lifestyle changes discussed, avoid NSAIDS Continue current medications  Hemorrhoids, unspecified hemorrhoid type -     CBC with Differential/Platelet; Future -     IBC + Ferritin; Future -     Comprehensive metabolic panel; Future  Left flank pain Has history of kidney stones, will get KUB No rash, will get on IBGard -     DG Abd 1 View; Future  Anemia, unspecified type -     IBC + Ferritin; Future Will evaluate for anemia with last CBC slightly low.      Patient Care Team: Medicine, Stacey Moody Internal as PCP - General (Internal Medicine) Stacey Hector, MD as PCP - Cardiology (Cardiology)  HISTORY OF PRESENT ILLNESS: 76 y.o. female with a past medical history of hypertension, hyperlipidemia, right renal artery stenosis, carotid artery stenosis, reflux, personal history of adenomatous polyps and others listed below  presents for evaluation of blockage in intestines.   2017 colonoscopy asending 8 mm semi peduculated adenoma removed 11/2021 colonoscopy with Dr. Abbey Moody For rectal bleeding showed nonbleeding internal hemorrhoids 20-25 cm in the sigmoid colon evidence of tight turn and significant diverticular disease switch to ultraslim colonoscopy and still unable to advance despite patient being benign colonoscopy was aborted.  Stacey Moody daughter is here with her.  Partial hysterectomy in 1981.  Had surgery in 1987, had "kink" in her intestines, surgery with Stacey Moody, unable to see report. No issues after that.   Patient denies any further rectal bleeding.  Has BM once a day, well formed, occ diarrhea.  Had one episode of BRB on TP, can have loose stools, no straining.  Has some left sided flank pain last 1-2 hours has had for last several weeks, can be daily, not worse with food/movement, occ tylenol.  Has had kidney stones in the past, no urinary issues.  No fever, chills.  CBC showed RBC 3.68, HGB 12.3, HCT 35.2, and MCV 95.7.  She has vertigo one and off for 2 years, had COVID and flu July of last year, only has nausea with vertigo.  2 sisters with colon cancer in 66's or 79's.   Current Medications:    Current Outpatient Medications (Cardiovascular):    amLODipine (NORVASC) 5 MG tablet, Take 5 mg by mouth daily.   atorvastatin (LIPITOR) 10 MG tablet, Take 10 mg by mouth 2 (two) times a week.   hydrochlorothiazide (HYDRODIURIL) 25 MG tablet, TAKE 1 TABLET BY MOUTH EVERY DAY   losartan (COZAAR) 100 MG tablet, TAKE 1 TABLET(100 MG) BY MOUTH DAILY  Current Outpatient Medications (Respiratory):    cetirizine (ZYRTEC) 5 MG tablet, Take 1 tablet (5 mg total) by mouth daily. (Patient taking differently: Take 5 mg by mouth daily. As needed)   fluticasone (FLONASE) 50 MCG/ACT nasal spray, Place 2 sprays into both nostrils daily. (Patient taking differently: Place 2 sprays into both nostrils as  needed.)  Current Outpatient Medications (Analgesics):    aspirin EC 81 MG tablet, Take 81 mg by mouth daily.   Current Outpatient Medications (Other):    cephALEXin (KEFLEX) 500 MG capsule, Take 1 capsule (500 mg total) by mouth every 6 (six) hours for 5 days.   cholecalciferol (VITAMIN D) 1000 UNITS tablet, Take 1,000 Units by mouth daily.   mupirocin ointment (BACTROBAN) 2 %, Apply 1 Application topically 2 (two) times daily.  Medical History:  Past Medical History:  Diagnosis Date   Carotid artery stenosis    Colon polyps    Diverticulosis    GERD (gastroesophageal reflux disease)    Hyperlipidemia    Hypertension    Influenza A 01/01/2014   Kidney stones    Osteopenia    T-2.3   Pneumonia    Right renal artery stenosis ([place])    Syncope 12/31/2013   Vasovagal or hypovolemia/orthostatic   Allergies:  Allergies  Allergen Reactions   Tetanus Toxoids Swelling     Surgical History:  She  has a past surgical history that includes Abdominal hysterectomy (1982); Small intestine surgery; CT scan (July 2015); Colonoscopy (N/A, 07/30/2016); polypectomy (07/30/2016); LEFT HEART CATH AND CORONARY ANGIOGRAPHY (N/A, 01/09/2020); and Flexible sigmoidoscopy (11/28/2021). Family History:  Her family history includes Cancer in her brother, father, mother, sister, sister, and sister; Diabetes in her father; Heart attack in her brother and mother; Heart disease in her father; Hyperlipidemia in her brother, father, sister, sister, and sister. Social History:   reports that she quit smoking about 25 years ago. Her smoking use included cigarettes. She has never used smokeless tobacco. She reports that she does not drink alcohol and does not use drugs.  REVIEW OF SYSTEMS  : All other systems reviewed and negative except where noted in the History of Present Illness.   PHYSICAL EXAM: BP 140/60   Pulse 73   Ht '5\' 3"'$  (1.6 m)   Wt 152 lb (68.9 kg)   BMI 26.93 kg/m  General:   Pleasant, well  developed female in no acute distress Head:   Normocephalic and atraumatic. Eyes:  sclerae anicteric,conjunctive pink  Heart:   regular rate and rhythm Pulm:  Clear anteriorly; no wheezing Abdomen:   Soft, Obese AB, Active bowel sounds. mild tenderness in the left flank. Without guarding and Without rebound, No organomegaly appreciated, no splenomegaly. No rash.  Rectal: Not evaluated Extremities:  Without edema. Msk: Symmetrical without gross deformities. Peripheral pulses intact.  Neurologic:  Alert and  oriented x4;  No focal deficits.  Skin:   Dry and intact without significant lesions or rashes. Psychiatric:  Cooperative. Normal mood and affect.    Vladimir Crofts, PA-C 3:44 PM

## 2022-07-16 NOTE — ED Triage Notes (Addendum)
Pt reports slipped and scraped left arm on wood door last week. Pt reports site was healing and looked better until last night. Pt reports cleaned site with peroxide and vasoline patches.   A linear open area noted to left forearm. Small scabbed and green areas of drainage noted to site. Pt denies fever, pain.

## 2022-07-16 NOTE — ED Provider Notes (Signed)
[facility]    CSN: [id number] Arrival date & time: 07/16/22  1233      History   Chief Complaint Chief Complaint  Patient presents with   Skin Problem    HPI Stacey Moody is a 76 y.o. female.   Patient presents with 1 week of wound to left arm.  Reports she "fell over my own feet" while walking at home and slammed into a door frame inside her home.  Since, she has had a wound and bruising to the left forearm.  Reports she has been applying Neosporin and a dressing to the wound and last night it was mostly closed up, so she left the area open over night.  This morning when she woke up, the wound had opened back up, started draining, and is more painful than it was initially.   She denies fevers and nausea/vomiting.  Also denies antibiotic use in the past 90 days.  Reports she is allergic to the tetanus shot.       Past Medical History:  Diagnosis Date   Carotid artery stenosis    Diverticulosis    GERD (gastroesophageal reflux disease)    Hyperlipidemia    Hypertension    Influenza A 01/01/2014   Osteopenia    T-2.3   Right renal artery stenosis ([facility])    Syncope 12/31/2013   Vasovagal or hypovolemia/orthostatic    Patient Active Problem List   Diagnosis Date Noted   Unstable angina ([place])    Chest pain 01/07/2020   Right renal artery stenosis ([place])    Osteopenia    Abdominal pain    Legionella infection ([place])    Gastroenteritis 12/31/2014   Elevated transaminase level 12/31/2014   CAP (community acquired pneumonia) 12/31/2014   Nausea vomiting and diarrhea 12/30/2014   Hypokalemia 12/30/2014   Hyponatremia 12/30/2014   Dehydration 12/30/2014   Fever 12/30/2014   Other pancytopenia ([place]) 01/03/2014   Carotid artery stenosis 01/03/2014   Vertigo 01/02/2014   Right otitis media 01/02/2014   Leukopenia 01/02/2014   Influenza A 01/01/2014   Scalp hematoma 01/01/2014   Syncope 12/31/2013   Diarrhea 12/31/2013   URI (upper respiratory infection)  12/31/2013   Hypertension    Hyperlipidemia    GERD (gastroesophageal reflux disease)    Occlusion and stenosis of carotid artery without mention of cerebral infarction 06/22/2012    Past Surgical History:  Procedure Laterality Date   ABDOMINAL HYSTERECTOMY  1982   Partial hysterectomy   COLONOSCOPY N/A 07/30/2016   Procedure: COLONOSCOPY;  Surgeon: Daneil Dolin, MD;  [facility];  Service: Endoscopy;  Laterality: N/A;  8:30 AM   CT SCAN  July 2015   Chest   FLEXIBLE SIGMOIDOSCOPY  11/28/2021   Procedure: FLEXIBLE SIGMOIDOSCOPY;  Surgeon: [name], DO;  Location: [facility];  Service: Endoscopy;;   LEFT HEART CATH AND CORONARY ANGIOGRAPHY N/A 01/09/2020   Procedure: LEFT HEART CATH AND CORONARY ANGIOGRAPHY;  Surgeon: Nelva Bush, MD;  Location: [facility];  Service: Cardiovascular;  Laterality: N/A;   POLYPECTOMY  07/30/2016   Procedure: POLYPECTOMY;  Surgeon: Daneil Dolin, MD;  Location: [facility];  Service: Endoscopy;;  descending colon   SMALL INTESTINE SURGERY      OB History   No obstetric history on file.      Home Medications    Prior to Admission medications   Medication Sig Start Date End Date Taking? Authorizing Provider  cephALEXin (KEFLEX) 500 MG capsule  Take 1 capsule (500 mg total) by mouth every 6 (six) hours for 5 days. 07/16/22 07/21/22 Yes Eulogio Bear, NP  mupirocin ointment (BACTROBAN) 2 % Apply 1 Application topically 2 (two) times daily. 07/16/22  Yes Noemi Chapel A, NP  amLODipine (NORVASC) 5 MG tablet Take 5 mg by mouth daily. 09/26/21   [provider]  aspirin EC 81 MG tablet Take 81 mg by mouth daily.    [provider]  atorvastatin (LIPITOR) 10 MG tablet Take 10 mg by mouth 2 (two) times a week. 09/24/21   [provider]  benzonatate (TESSALON) 100 MG capsule Take 1-2 capsules (100-200 mg total) by mouth 3 (three) times daily as needed for cough. 03/18/22   Jaynee Eagles, PA-C   cetirizine (ZYRTEC) 5 MG tablet Take 1 tablet (5 mg total) by mouth daily. 03/18/22   Jaynee Eagles, PA-C  cholecalciferol (VITAMIN D) 1000 UNITS tablet Take 1,000 Units by mouth daily.    [provider]  fluticasone (FLONASE) 50 MCG/ACT nasal spray Place 2 sprays into both nostrils daily. Patient taking differently: Place 2 sprays into both nostrils as needed. 01/09/20   Antonieta Pert, MD  hydrochlorothiazide (HYDRODIURIL) 25 MG tablet TAKE 1 TABLET BY MOUTH EVERY DAY 06/15/20   Alycia Rossetti, MD  losartan (COZAAR) 100 MG tablet TAKE 1 TABLET(100 MG) BY MOUTH DAILY 06/15/20   Alycia Rossetti, MD  metoprolol succinate (TOPROL-XL) 25 MG 24 hr tablet Take 25 mg by mouth daily. 08/12/21   [provider]  oseltamivir (TAMIFLU) 75 MG capsule Take 1 capsule (75 mg total) by mouth 2 (two) times daily. 12/05/21   Jaynee Eagles, PA-C  polyethylene glycol-electrolytes (NULYTELY) 420 g solution As directed 11/19/21   Rourk, Cristopher Estimable, MD  potassium chloride SA (KLOR-CON M) 20 MEQ tablet Take 2 tablets (40 mEq total) by mouth once for 1 dose. 11/27/21 11/27/21  Daneil Dolin, MD  promethazine-dextromethorphan (PROMETHAZINE-DM) 6.25-15 MG/5ML syrup Take 2.5 mLs by mouth 3 (three) times daily as needed for cough. 03/18/22   Jaynee Eagles, PA-C  pseudoephedrine (SUDAFED) 30 MG tablet Take 1 tablet (30 mg total) by mouth every 8 (eight) hours as needed for congestion. 03/18/22   Jaynee Eagles, PA-C    Family History Family History  Problem Relation Age of Onset   Cancer Mother        pancratic   Heart attack Mother    Cancer Father        stomach   Diabetes Father    Hyperlipidemia Father    Heart disease Father        NOT  before age 10   Cancer Sister        lung   Hyperlipidemia Sister    Cancer Brother        thyroid, prostate   Hyperlipidemia Brother    Heart attack Brother    Cancer Sister        colon   Hyperlipidemia Sister    Cancer Sister        colon   Hyperlipidemia Sister      Social History Social History   Tobacco Use   Smoking status: Former    Types: Cigarettes    Quit date: 12/22/1996    Years since quitting: 25.5   Smokeless tobacco: Never  Vaping Use   Vaping Use: Never used  Substance Use Topics   Alcohol use: No   Drug use: No     Allergies   Tetanus toxoids  Review of Systems Review of Systems Per HPI  Physical Exam Triage Vital Signs ED Triage Vitals  Enc Vitals Group     BP 07/16/22 1259 (!) 165/72     Pulse Rate 07/16/22 1259 74     Resp 07/16/22 1259 18     Temp 07/16/22 1259 97.6 F (36.4 C)     Temp Source 07/16/22 1259 Oral     SpO2 07/16/22 1259 95 %     Weight --      Height --      Head Circumference --      Peak Flow --      Pain Score 07/16/22 1300 0     Pain Loc --      Pain Edu? --      Excl. in [place]? --    No data found.  Updated Vital Signs BP (!) 165/72 (BP Location: Right Arm)   Pulse 74   Temp 97.6 F (36.4 C) (Oral)   Resp 18   SpO2 95%   Visual Acuity Right Eye Distance:   Left Eye Distance:   Bilateral Distance:    Right Eye Near:   Left Eye Near:    Bilateral Near:     Physical Exam Vitals and nursing note reviewed.  Constitutional:      General: She is not in acute distress.    Appearance: Normal appearance. She is not toxic-appearing.  HENT:     Head: Normocephalic and atraumatic.     Mouth/Throat:     Mouth: Mucous membranes are moist.     Pharynx: Oropharynx is clear.  Pulmonary:     Effort: Pulmonary effort is normal. No respiratory distress.  Musculoskeletal:     Left forearm: Tenderness present. No swelling, edema or bony tenderness.     Right wrist: Normal.     Left wrist: Normal. Normal pulse.       Arms:     Comments: Approximately 4.5 cm open wound to left forearm in the area marked.  There is surrounding tenderness and ecchymosis.  No redness, warmth, fluctuance.  No active drainage.  Full ROM of wrist, arm, elbow.  Capillary refill and sensation/strength  normal.    Skin:    Capillary Refill: Capillary refill takes less than 2 seconds.  Neurological:     Mental Status: She is alert and oriented to person, place, and time.  Psychiatric:        Behavior: Behavior is cooperative.      UC Treatments / Results  Labs (all labs ordered are listed, but only abnormal results are displayed) Labs Reviewed - No data to display  EKG   Radiology No results found.  Procedures Procedures (including critical care time)  Medications Ordered in UC Medications - No data to display  Initial Impression / Assessment and Plan / UC Course  I have reviewed the triage vital signs and the nursing notes.  Pertinent labs & imaging results that were available during my care of the patient were reviewed by me and considered in my medical decision making (see chart for details).    Patient is a very pleasant, well-appearing 76 year old female presenting for arm wound today.  On examination, the wound appears to be infected.  However, there is no surrounding fluctuance on exam and no indicated for I&D  Treat with cephalexin 500 mg four times daily for 5 days.  Wound care discussed.  ER precautions discussed.  Seek care if symptoms do not improve with this  treatment.  Final Clinical Impressions(s) / UC Diagnoses   Final diagnoses:  Cellulitis of left upper extremity     Discharge Instructions      - Please clean your wound twice daily with a mild soap and water, keep covered with a nonadherent dressing.  You can use Bactroban ointment over top of the open area. -Start the Keflex 4 times daily for 5 days to help treat surrounding infection -If symptoms persist or worsen despite treatment, please seek care    ED Prescriptions     Medication Sig Dispense Auth. Provider   cephALEXin (KEFLEX) 500 MG capsule Take 1 capsule (500 mg total) by mouth every 6 (six) hours for 5 days. 20 capsule Noemi Chapel A, NP   mupirocin ointment (BACTROBAN) 2 %  Apply 1 Application topically 2 (two) times daily. 22 g Eulogio Bear, NP      PDMP not reviewed this encounter.   Eulogio Bear, NP 07/16/22 1339

## 2022-07-16 NOTE — ED Notes (Signed)
Bacitracin applied to site. Telfa applied on top of ointment. Secured with coban. Pt tolerated well.  Site management and infection prevention education provided.

## 2022-07-17 ENCOUNTER — Encounter: Payer: Self-pay | Admitting: Physician Assistant

## 2022-07-17 ENCOUNTER — Ambulatory Visit (INDEPENDENT_AMBULATORY_CARE_PROVIDER_SITE_OTHER)
Admission: RE | Admit: 2022-07-17 | Discharge: 2022-07-17 | Disposition: A | Discharge status: Home / Self Care | Payer: Medicare Other | Source: Ambulatory Visit | Attending: Physician Assistant | Admitting: Physician Assistant

## 2022-07-17 ENCOUNTER — Other Ambulatory Visit (INDEPENDENT_AMBULATORY_CARE_PROVIDER_SITE_OTHER): Payer: Medicare Other

## 2022-07-17 ENCOUNTER — Ambulatory Visit (INDEPENDENT_AMBULATORY_CARE_PROVIDER_SITE_OTHER): Payer: Medicare Other | Admitting: Physician Assistant

## 2022-07-17 ENCOUNTER — Other Ambulatory Visit: Payer: Self-pay

## 2022-07-17 VITALS — BP 140/60 | HR 73 | Ht 63.0 in | Wt 152.0 lb

## 2022-07-17 DIAGNOSIS — R109 Unspecified abdominal pain: Secondary | ICD-10-CM

## 2022-07-17 DIAGNOSIS — Z86010 Personal history of colonic polyps: Secondary | ICD-10-CM | POA: Diagnosis not present

## 2022-07-17 DIAGNOSIS — Z9889 Other specified postprocedural states: Secondary | ICD-10-CM

## 2022-07-17 DIAGNOSIS — D649 Anemia, unspecified: Secondary | ICD-10-CM

## 2022-07-17 DIAGNOSIS — K649 Unspecified hemorrhoids: Secondary | ICD-10-CM

## 2022-07-17 DIAGNOSIS — K5730 Diverticulosis of large intestine without perforation or abscess without bleeding: Secondary | ICD-10-CM

## 2022-07-17 DIAGNOSIS — K219 Gastro-esophageal reflux disease without esophagitis: Secondary | ICD-10-CM | POA: Diagnosis not present

## 2022-07-17 LAB — COMPREHENSIVE METABOLIC PANEL
ALT: 20 U/L (ref 0–35)
AST: 26 U/L (ref 0–37)
Albumin: 4.9 g/dL (ref 3.5–5.2)
Alkaline Phosphatase: 75 U/L (ref 39–117)
BUN: 15 mg/dL (ref 6–23)
CO2: 28 mEq/L (ref 19–32)
Calcium: 10.1 mg/dL (ref 8.4–10.5)
Chloride: 100 mEq/L (ref 96–112)
Creatinine, Ser: 0.74 mg/dL (ref 0.40–1.20)
GFR: 78.29 mL/min (ref >60.00)
Glucose, Bld: 95 mg/dL (ref 70–99)
Potassium: 3.8 mEq/L (ref 3.5–5.1)
Sodium: 138 mEq/L (ref 135–145)
Total Bilirubin: 0.5 mg/dL (ref 0.2–1.2)
Total Protein: 7.8 g/dL (ref 6.0–8.3)

## 2022-07-17 LAB — CBC WITH DIFFERENTIAL/PLATELET
Basophils Absolute: 0.1 10*3/uL (ref 0.0–0.1)
Basophils Relative: 0.9 % (ref 0.0–3.0)
Eosinophils Absolute: 0.1 10*3/uL (ref 0.0–0.7)
Eosinophils Relative: 0.7 % (ref 0.0–5.0)
HCT: 38.7 % (ref 36.0–46.0)
Hemoglobin: 13.2 g/dL (ref 12.0–15.0)
Lymphocytes Relative: 17.1 % (ref 12.0–46.0)
Lymphs Abs: 1.5 10*3/uL (ref 0.7–4.0)
MCHC: 34.1 g/dL (ref 30.0–36.0)
MCV: 94.8 fl (ref 78.0–100.0)
Monocytes Absolute: 0.7 10*3/uL (ref 0.1–1.0)
Monocytes Relative: 8.1 % (ref 3.0–12.0)
Neutro Abs: 6.5 10*3/uL (ref 1.4–7.7)
Neutrophils Relative %: 73.2 % (ref 43.0–77.0)
Platelets: 262 10*3/uL (ref 150.0–400.0)
RBC: 4.08 Mil/uL (ref 3.87–5.11)
RDW: 13 % (ref 11.5–15.5)
WBC: 8.9 10*3/uL (ref 4.0–10.5)

## 2022-07-17 LAB — IBC + FERRITIN
Ferritin: 74.4 ng/mL (ref 10.0–291.0)
Iron: 89 ug/dL (ref 42–145)
Saturation Ratios: 21.7 % (ref 20.0–50.0)
TIBC: 410.2 ug/dL (ref 250.0–450.0)
Transferrin: 293 mg/dL (ref 212.0–360.0)

## 2022-07-17 NOTE — Patient Instructions (Addendum)
Your provider has requested that you go to the basement level for lab work before leaving today. Press "B" on the elevator. The lab is located at the first door on the left as you exit the elevator.  Your provider has requested that you go to the basement level for an Xray of your abdomen before leaving today.  Press "B" on the elevator.  The lab is located at the first door on the left as you exit the elevator.  Increase activity Can do trial of IBGard for AB pain- Take 1-2 capsules once a day for maintence or twice a day during a flare  Toileting tips to help with your constipation - Drink at least 64-80 ounces of water/liquid per day. - Establish a time to try to move your bowels every day.  For many people, this is after a cup of coffee or after a meal such as breakfast. - Sit all of the way back on the toilet keeping your back fairly straight and while sitting up, try to rest the tops of your forearms on your upper thighs.   - Raising your feet with a step stool/squatty potty can be helpful to improve the angle that allows your stool to pass through the rectum. - Relax the rectum feeling it bulge toward the toilet water.  If you feel your rectum raising toward your body, you are contracting rather than relaxing. - Breathe in and slowly exhale. "Belly breath" by expanding your belly towards your belly button. Keep belly expanded as you gently direct pressure down and back to the anus.  A low pitched GRRR sound can assist with increasing intra-abdominal pressure.  - Repeat 3-4 times. If unsuccessful, contract the pelvic floor to restore normal tone and get off the toilet.  Avoid excessive straining. - To reduce excessive wiping by teaching your anus to normally contract, place hands on outer aspect of knees and resist knee movement outward.  Hold 5-10 second then place hands just inside of knees and resist inward movement of knees.  Hold 5 seconds.  Repeat a few times each way.  I appreciate  the opportunity to care for you. Vicie Mutters, PA-C

## 2022-07-17 NOTE — Progress Notes (Signed)
I agree with the assessment and plan as outlined by Ms. Silverio Lay. I would talk with the patient and explain that there is a possibility that a repeat colonoscopy would not be able to advance past the area of tight stricture seen on her last colonoscopy. I would be happy to attempt a repeat colonoscopy if the patient is interested. Alternatively we could send her for CT colonography. Let me know what the patient decides.

## 2022-07-18 ENCOUNTER — Other Ambulatory Visit: Payer: Self-pay

## 2022-07-18 ENCOUNTER — Telehealth: Payer: Self-pay | Admitting: Physician Assistant

## 2022-07-18 DIAGNOSIS — Z86010 Personal history of colonic polyps: Secondary | ICD-10-CM

## 2022-07-18 DIAGNOSIS — K648 Other hemorrhoids: Secondary | ICD-10-CM

## 2022-07-18 NOTE — Telephone Encounter (Signed)
Patient would like to proceed with colonoscopy. She has been scheduled for procedure on 09/01/22 at 11:00 am in the [facility] with Dr. Lorenso Courier. She is aware she will need a caregiver for the procedure. She is not on any blood thinners or diabetic. Instructions sent to mychart, and patient advised to call back with any further questions.

## 2022-07-18 NOTE — Telephone Encounter (Signed)
Inbound call from patient stating she has contacted her insurance company and they stated as log as the provider states it is medically neccessary to schedule procedure they will cover cost. Please give p[patient a call to further advise.  Thank you

## 2022-07-21 DIAGNOSIS — I10 Essential (primary) hypertension: Secondary | ICD-10-CM | POA: Diagnosis not present

## 2022-08-20 DIAGNOSIS — I10 Essential (primary) hypertension: Secondary | ICD-10-CM | POA: Diagnosis not present

## 2022-09-01 ENCOUNTER — Ambulatory Visit (AMBULATORY_SURGERY_CENTER): Payer: Medicare Other | Admitting: Internal Medicine

## 2022-09-01 ENCOUNTER — Encounter: Payer: Self-pay | Admitting: Internal Medicine

## 2022-09-01 VITALS — BP 148/55 | HR 64 | Temp 96.9°F | Resp 11 | Ht 63.0 in | Wt 152.0 lb

## 2022-09-01 DIAGNOSIS — D120 Benign neoplasm of cecum: Secondary | ICD-10-CM

## 2022-09-01 DIAGNOSIS — Z09 Encounter for follow-up examination after completed treatment for conditions other than malignant neoplasm: Secondary | ICD-10-CM | POA: Diagnosis not present

## 2022-09-01 DIAGNOSIS — Z86010 Personal history of colonic polyps: Secondary | ICD-10-CM

## 2022-09-01 DIAGNOSIS — D122 Benign neoplasm of ascending colon: Secondary | ICD-10-CM | POA: Diagnosis not present

## 2022-09-01 MED ORDER — SODIUM CHLORIDE 0.9 % IV SOLN: 500.0000 mL | Freq: Once | INTRAVENOUS | Status: DC

## 2022-09-01 NOTE — Op Note (Signed)
[facility] Patient Name: Stacey Moody Procedure Date: 09/01/2022 10:32 AM MRN: [id number] Endoscopist: Sonny Masters "Stacey Moody ,  Age: 76 Referring MD:  Date of Birth: May 18, 1946 Gender: Female Account #: [id number] Procedure:                Colonoscopy Indications:              High risk colon cancer surveillance: Personal                            history of colonic polyps Medicines:                Monitored Anesthesia Care Procedure:                Pre-Anesthesia Assessment:                           - Prior to the procedure, a History and Physical                            was performed, and patient medications and                            allergies were reviewed. The patient's tolerance of                            previous anesthesia was also reviewed. The risks                            and benefits of the procedure and the sedation                            options and risks were discussed with the patient.                            All questions were answered, and informed consent                            was obtained. Prior Anticoagulants: The patient has                            taken no previous anticoagulant or antiplatelet                            agents. ASA Grade Assessment: II - A patient with                            mild systemic disease. After reviewing the risks                            and benefits, the patient was deemed in                            satisfactory condition to undergo the procedure.  After obtaining informed consent, the colonoscope                            was passed under direct vision. Throughout the                            procedure, the patient's blood pressure, pulse, and                            oxygen saturations were monitored continuously. The                            0405 PCF-H190TL Slim SB Colonoscope was introduced                            through the anus and advanced to the  the terminal                            ileum. The colonoscopy was performed without                            difficulty. The patient tolerated the procedure                            well. The quality of the bowel preparation was                            good. The terminal ileum, ileocecal valve,                            appendiceal orifice, and rectum were photographed. Scope In: 10:44:39 AM Scope Out: 11:12:37 AM Total Procedure Duration: 0 hours 27 minutes 58 seconds  Findings:                 The terminal ileum appeared normal.                           A 2 mm polyp was found in the cecum. The polyp was                            sessile. The polyp was removed with a cold biopsy                            forceps. Resection and retrieval were complete.                           Three sessile polyps were found in the ascending                            colon and cecum. The polyps were 3 to 5 mm in size.                            These polyps were removed with a cold snare.  Resection and retrieval were complete.                           Multiple small and large-mouthed diverticula were                            found in the sigmoid colon, descending colon,                            transverse colon and ascending colon.                           Non-bleeding internal hemorrhoids were found during                            retroflexion. Complications:            No immediate complications. Estimated Blood Loss:     Estimated blood loss was minimal. Impression:               - The examined portion of the ileum was normal.                           - One 2 mm polyp in the cecum, removed with a cold                            biopsy forceps. Resected and retrieved.                           - Three 3 to 5 mm polyps in the ascending colon and                            in the cecum, removed with a cold snare. Resected                            and  retrieved.                           - Diverticulosis in the sigmoid colon, in the                            descending colon, in the transverse colon and in                            the ascending colon.                           - Non-bleeding internal hemorrhoids. Recommendation:           - Discharge patient to home (with escort).                           - Await pathology results.                           - The findings and recommendations were discussed  with the patient. Dr Georgian Co "Stacey Moody" Stacey Moody,  09/01/2022 11:22:00 AM

## 2022-09-01 NOTE — Progress Notes (Signed)
Called to room to assist during endoscopic procedure.  Patient ID and intended procedure confirmed with present staff. Received instructions for my participation in the procedure from the performing physician.  

## 2022-09-01 NOTE — Progress Notes (Signed)
PT taken to PACU. Monitors in place. VSS. Report given to RN. 

## 2022-09-01 NOTE — Patient Instructions (Signed)
YOU HAD AN ENDOSCOPIC PROCEDURE TODAY AT [facility]:   Refer to the procedure report that was given to you for any specific questions about what was found during the examination.  If the procedure report does not answer your questions, please call your gastroenterologist to clarify.  If you requested that your care partner not be given the details of your procedure findings, then the procedure report has been included in a sealed envelope for you to review at your convenience later.  YOU SHOULD EXPECT: Some feelings of bloating in the abdomen. Passage of more gas than usual.  Walking can help get rid of the air that was put into your GI tract during the procedure and reduce the bloating. If you had a lower endoscopy (such as a colonoscopy or flexible sigmoidoscopy) you may notice spotting of blood in your stool or on the toilet paper. If you underwent a bowel prep for your procedure, you may not have a normal bowel movement for a few days.  Please Note:  You might notice some irritation and congestion in your nose or some drainage.  This is from the oxygen used during your procedure.  There is no need for concern and it should clear up in a day or so.  SYMPTOMS TO REPORT IMMEDIATELY:  Following lower endoscopy (colonoscopy or flexible sigmoidoscopy):  Excessive amounts of blood in the stool  Significant tenderness or worsening of abdominal pains  Swelling of the abdomen that is new, acute  Fever of 100F or higher   For urgent or emergent issues, a gastroenterologist can be reached at any hour by calling [phone]. Do not use MyChart messaging for urgent concerns.    DIET:  We do recommend a small meal at first, but then you may proceed to your regular diet.  Drink plenty of fluids but you should avoid alcoholic beverages for 24 hours.  MEDICATIONS: Continue present medications.  Please see handouts given to you by your recovery nurse.  Thank you for allowing [place] to  provide for your healthcare needs today.  ACTIVITY:  You should plan to take it easy for the rest of today and you should NOT DRIVE or use heavy machinery until tomorrow (because of the sedation medicines used during the test).    FOLLOW UP: Our staff will call the number listed on your records the next business day following your procedure.  We will call around 7:15- 8:00 am to check on you and address any questions or concerns that you may have regarding the information given to you following your procedure. If we do not reach you, we will leave a message.     If any biopsies were taken you will be contacted by phone or by letter within the next 1-3 weeks.  Please call us at [phone] if you have not heard about the biopsies in 3 weeks.    SIGNATURES/CONFIDENTIALITY: You and/or your care partner have signed paperwork which will be entered into your electronic medical record.  These signatures attest to the fact that that the information above on your After Visit Summary has been reviewed and is understood.  Full responsibility of the confidentiality of this discharge information lies with you and/or your care-partner.

## 2022-09-01 NOTE — Progress Notes (Signed)
GASTROENTEROLOGY PROCEDURE H&P NOTE   Primary Care Physician: [facility]    Reason for Procedure:   History of colon polyps  Plan:    Colonoscopy  Patient is appropriate for endoscopic procedure(s) in the ambulatory ([facility]) setting.  The nature of the procedure, as well as the risks, benefits, and alternatives were carefully and thoroughly reviewed with the patient. Ample time for discussion and questions allowed. The patient understood, was satisfied, and agreed to proceed.     HPI: Stacey Moody is a 76 y.o. female who presents for colonoscopy for evaluation of history of colon polyps.  Patient was most recently seen in the Gastroenterology Clinic on 07/17/22.  No interval change in medical history since that appointment. Please refer to that note for full details regarding GI history and clinical presentation.   Past Medical History:  Diagnosis Date   Carotid artery stenosis    Colon polyps    Diverticulosis    GERD (gastroesophageal reflux disease)    Hyperlipidemia    Hypertension    Influenza A 01/01/2014   Kidney stones    Osteopenia    T-2.3   Pneumonia    Right renal artery stenosis ([place])    Syncope 12/31/2013   Vasovagal or hypovolemia/orthostatic    Past Surgical History:  Procedure Laterality Date   ABDOMINAL HYSTERECTOMY  1982   Partial hysterectomy   COLONOSCOPY N/A 07/30/2016   Procedure: COLONOSCOPY;  Surgeon: Daneil Dolin, MD;  Location: [facility];  Service: Endoscopy;  Laterality: N/A;  8:30 AM   CT SCAN  July 2015   Chest   FLEXIBLE SIGMOIDOSCOPY  11/28/2021   Procedure: FLEXIBLE SIGMOIDOSCOPY;  Surgeon: [name], DO;  Location: [facility];  Service: Endoscopy;;   LEFT HEART CATH AND CORONARY ANGIOGRAPHY N/A 01/09/2020   Procedure: LEFT HEART CATH AND CORONARY ANGIOGRAPHY;  Surgeon: Nelva Bush, MD;  Location: [facility];  Service: Cardiovascular;  Laterality: N/A;   POLYPECTOMY  07/30/2016   Procedure:  POLYPECTOMY;  Surgeon: Daneil Dolin, MD;  Location: [facility];  Service: Endoscopy;;  descending colon   SMALL INTESTINE SURGERY      Prior to Admission medications   Medication Sig Start Date End Date Taking? Authorizing Provider  amLODipine (NORVASC) 5 MG tablet Take 5 mg by mouth daily. 09/26/21  Yes [provider]  aspirin EC 81 MG tablet Take 81 mg by mouth daily.   Yes [provider]  cetirizine (ZYRTEC) 5 MG tablet Take 1 tablet (5 mg total) by mouth daily. Patient taking differently: Take 5 mg by mouth daily. As needed 03/18/22  Yes Jaynee Eagles, PA-C  cholecalciferol (VITAMIN D) 1000 UNITS tablet Take 1,000 Units by mouth daily.   Yes [provider]  hydrochlorothiazide (HYDRODIURIL) 25 MG tablet TAKE 1 TABLET BY MOUTH EVERY DAY 06/15/20  Yes [place], Modena Nunnery, MD  losartan (COZAAR) 100 MG tablet TAKE 1 TABLET(100 MG) BY MOUTH DAILY 06/15/20  Yes [place], Modena Nunnery, MD  atorvastatin (LIPITOR) 10 MG tablet Take 10 mg by mouth 2 (two) times a week. 09/24/21   [provider]  fluticasone (FLONASE) 50 MCG/ACT nasal spray Place 2 sprays into both nostrils daily. Patient taking differently: Place 2 sprays into both nostrils as needed. 01/09/20   Antonieta Pert, MD    Current Outpatient Medications  Medication Sig Dispense Refill   amLODipine (NORVASC) 5 MG tablet Take 5 mg by mouth daily.     aspirin EC 81  MG tablet Take 81 mg by mouth daily.     cetirizine (ZYRTEC) 5 MG tablet Take 1 tablet (5 mg total) by mouth daily. (Patient taking differently: Take 5 mg by mouth daily. As needed) 30 tablet 0   cholecalciferol (VITAMIN D) 1000 UNITS tablet Take 1,000 Units by mouth daily.     hydrochlorothiazide (HYDRODIURIL) 25 MG tablet TAKE 1 TABLET BY MOUTH EVERY DAY 90 tablet 2   losartan (COZAAR) 100 MG tablet TAKE 1 TABLET(100 MG) BY MOUTH DAILY 90 tablet 2   atorvastatin (LIPITOR) 10 MG tablet Take 10 mg by mouth 2 (two) times a week.     fluticasone  (FLONASE) 50 MCG/ACT nasal spray Place 2 sprays into both nostrils daily. (Patient taking differently: Place 2 sprays into both nostrils as needed.) 16 g 6   Current Facility-Administered Medications  Medication Dose Route Frequency Provider Last Rate Last Admin   0.9 %  sodium chloride infusion  500 mL Intravenous Once Sharyn Creamer, MD        Allergies as of 09/01/2022 - Review Complete 09/01/2022  Allergen Reaction Noted   Tetanus toxoids Swelling 05/07/2011    Family History  Problem Relation Age of Onset   Cancer Mother        pancratic   Heart attack Mother    Cancer Father        stomach   Diabetes Father    Hyperlipidemia Father    Heart disease Father        NOT  before age 19   Cancer Sister        lung   Hyperlipidemia Sister    Cancer Brother        thyroid, prostate   Hyperlipidemia Brother    Heart attack Brother    Cancer Sister        colon   Hyperlipidemia Sister    Cancer Sister        colon   Hyperlipidemia Sister     Social History   Socioeconomic History   Marital status: Widowed    Spouse name: Not on file   Number of children: 2   Years of education: Not on file   Highest education level: Not on file  Occupational History   Occupation: part time-- [employer]  Tobacco Use   Smoking status: Former    Types: Cigarettes    Quit date: 12/22/1996    Years since quitting: 25.7   Smokeless tobacco: Never  Vaping Use   Vaping Use: Never used  Substance and Sexual Activity   Alcohol use: No   Drug use: No   Sexual activity: Not on file  Other Topics Concern   Not on file  Social History Narrative   Not on file   Social Determinants of Health   Financial Resource Strain: Not on file  Food Insecurity: Not on file  Transportation Needs: Not on file  Physical Activity: Not on file  Stress: Not on file  Social Connections: Not on file  Intimate Partner Violence: Not on file    Physical Exam: Vital signs in last 24 hours: BP  (!) 155/66   Pulse 76   Temp (!) 96.9 F (36.1 C)   Ht '5\' 3"'$  (1.6 m)   Wt 152 lb (68.9 kg)   SpO2 96%   BMI 26.93 kg/m  GEN: NAD EYE: Sclerae anicteric ENT: MMM CV: Non-tachycardic Pulm: No increased WOB GI: Soft NEURO:  Alert & Oriented   Christia Reading, MD  [facility]   09/01/2022 10:32 AM

## 2022-09-01 NOTE — Progress Notes (Signed)
Pt's states no medical or surgical changes since previsit or office visit. 

## 2022-09-02 ENCOUNTER — Telehealth: Payer: Self-pay

## 2022-09-02 NOTE — Telephone Encounter (Signed)
  Follow up Call-     09/01/2022   10:10 AM  Call back number  Post procedure Call Back phone  # [phone]  Permission to leave phone message Yes     Patient questions:  Do you have a fever, pain , or abdominal swelling? No. Pain Score  0 *  Have you tolerated food without any problems? Yes.    Have you been able to return to your normal activities? Yes.    Do you have any questions about your discharge instructions: Diet   No. Medications  No. Follow up visit  No.  Do you have questions or concerns about your Care? No.  Actions: * If pain score is 4 or above: No action needed, pain <4.

## 2022-09-11 ENCOUNTER — Encounter: Payer: Self-pay | Admitting: Internal Medicine

## 2022-09-19 DIAGNOSIS — I10 Essential (primary) hypertension: Secondary | ICD-10-CM | POA: Diagnosis not present

## 2022-10-09 NOTE — Progress Notes (Signed)
Cardiology Office Note    Date:  10/10/2022   ID:  Stacey, Moody [birth year], MRN [id number]   PCP:  Medicine, [facility]   [facility]  Cardiologist:  Jenkins Rouge, MD     History of Present Illness:  Stacey Moody is a 76 y.o. female with a past medical history significant for hypertension, hyperlipidemia, coronary artery stenosis and possible renal artery stenosis by prior imaging in 2018.  Strong family history of CAD in both parents.  She is a former smoker having quit in 1998. She underwent left heart cath on 01/09/2020 showed diffuse nonobstructive first diagonal, distal LAD and RCA disease.  There was no wall motion abnormality.  Aggressive preventive therapy was recommended with LDL goal of less than 70, blood pressure less than 130/80 and A1c less than 7.  She was continued on high intensity statin.  In ED 09/22/21 with chest pain but left after a long wait. Labs stable, troponin negative x 2, D Dimer up 0.56, CTA no PE, mod to sever biapical scarring or atelectasis, aortic atherosclerosis.   BP  Rx with norvasc , HCTZ and losartan  doing better since then. Still working at dental office 4 days a week and walks 1/4 mile 3-4 times/week.  She complains of bilateral leg pain along her veins.  Dopplers 08/23/21 with no DVT She says its been this way since she had COVID in June. Discussed statin holiday and f/u with her primary to consider lumbar xray to r/o spinal stenosis   Left carotid 40-59% by duplex 09/13/21   She lives by herself Has 2 single daughters near by    Past Medical History:  Diagnosis Date   Carotid artery stenosis    Colon polyps    Diverticulosis    GERD (gastroesophageal reflux disease)    Hyperlipidemia    Hypertension    Influenza A 01/01/2014   Kidney stones    Osteopenia    T-2.3   Pneumonia    Right renal artery stenosis (Stacey Moody)    Syncope 12/31/2013   Vasovagal or hypovolemia/orthostatic    Past Surgical History:   Procedure Laterality Date   ABDOMINAL HYSTERECTOMY  1982   Partial hysterectomy   COLONOSCOPY N/A 07/30/2016   Procedure: COLONOSCOPY;  Surgeon: Daneil Dolin, MD;  [facility];  Service: Endoscopy;  Laterality: N/A;  8:30 AM   CT SCAN  July 2015   Chest   FLEXIBLE SIGMOIDOSCOPY  11/28/2021   Procedure: FLEXIBLE SIGMOIDOSCOPY;  Surgeon: [name], DO;  [facility];  Service: Endoscopy;;   LEFT HEART CATH AND CORONARY ANGIOGRAPHY N/A 01/09/2020   Procedure: LEFT HEART CATH AND CORONARY ANGIOGRAPHY;  Surgeon: Nelva Bush, MD;  Location: [facility];  Service: Cardiovascular;  Laterality: N/A;   POLYPECTOMY  07/30/2016   Procedure: POLYPECTOMY;  Surgeon: Daneil Dolin, MD;  [facility];  Service: Endoscopy;;  descending colon   SMALL INTESTINE SURGERY      Current Medications: Current Meds  Medication Sig   amLODipine (NORVASC) 5 MG tablet Take 5 mg by mouth daily.   aspirin EC 81 MG tablet Take 81 mg by mouth daily.   atorvastatin (LIPITOR) 10 MG tablet Take 10 mg by mouth 2 (two) times a week.   cetirizine (ZYRTEC) 5 MG tablet Take 1 tablet (5 mg total) by mouth daily. (Patient taking differently: Take 5 mg by mouth daily. As needed)   cholecalciferol (VITAMIN D)  1000 UNITS tablet Take 1,000 Units by mouth daily.   fluticasone (FLONASE) 50 MCG/ACT nasal spray Place 2 sprays into both nostrils daily. (Patient taking differently: Place 2 sprays into both nostrils as needed.)   hydrochlorothiazide (HYDRODIURIL) 25 MG tablet TAKE 1 TABLET BY MOUTH EVERY DAY   losartan (COZAAR) 100 MG tablet TAKE 1 TABLET(100 MG) BY MOUTH DAILY   Current Facility-Administered Medications for the 10/10/22 encounter (Office Visit) with Josue Hector, MD  Medication   0.9 %  sodium chloride infusion     Allergies:   Tetanus toxoids   Social History   Socioeconomic History   Marital status: Widowed    Spouse name: Not on file   Number of children:  2   Years of education: Not on file   Highest education level: Not on file  Occupational History   Occupation: part time-- [employer]  Tobacco Use   Smoking status: Former    Types: Cigarettes    Quit date: 12/22/1996    Years since quitting: 25.8   Smokeless tobacco: Never  Vaping Use   Vaping Use: Never used  Substance and Sexual Activity   Alcohol use: No   Drug use: No   Sexual activity: Not on file  Other Topics Concern   Not on file  Social History Narrative   Not on file   Social Determinants of Health   Financial Resource Strain: Not on file  Food Insecurity: Not on file  Transportation Needs: Not on file  Physical Activity: Not on file  Stress: Not on file  Social Connections: Not on file     Family History:  The patient's  family history includes Cancer in her brother, father, mother, sister, sister, and sister; Diabetes in her father; Heart attack in her brother and mother; Heart disease in her father; Hyperlipidemia in her brother, father, sister, sister, and sister.   ROS:   Please see the history of present illness.    ROS All other systems reviewed and are negative.   PHYSICAL EXAM:   VS:  BP (!) 140/60   Pulse 65   Ht '5\' 3"'$  (1.6 m)   Wt 152 lb 12.8 oz (69.3 kg)   SpO2 98%   BMI 27.07 kg/m   Physical Exam   Affect appropriate Healthy:  appears stated age HEENT: normal Neck supple with no adenopathy JVP normal no bruits no thyromegaly Lungs clear with no wheezing and good diaphragmatic motion Heart:  S1/S2 no murmur, no rub, gallop or click PMI normal Abdomen: benighn, BS positve, no tenderness, no AAA no bruit.  No HSM or HJR Distal pulses intact with no bruits No edema Neuro non-focal Skin warm and dry No muscular weakness   Wt Readings from Last 3 Encounters:  10/10/22 152 lb 12.8 oz (69.3 kg)  09/01/22 152 lb (68.9 kg)  07/17/22 152 lb (68.9 kg)      Studies/Labs Reviewed:   EKG:    10/11/2021 SR rate 69 non specific ST  changes 10/10/2022 SR rate 65 nonspecific ST changes    Recent Labs: 07/17/2022: ALT 20; BUN 15; Creatinine, Ser 0.74; Hemoglobin 13.2; Platelets 262.0; Potassium 3.8; Sodium 138   Lipid Panel    Component Value Date/Time   CHOL 131 01/07/2020 0706   TRIG 63 01/07/2020 0706   HDL 64 01/07/2020 0706   CHOLHDL 2.0 01/07/2020 0706   VLDL 13 01/07/2020 0706   LDLCALC 54 01/07/2020 0706    Additional studies/ records that were reviewed today  include:  CTA 09/23/21 FINDINGS: Cardiovascular: There is moderate to marked severity calcification of the aortic arch. Satisfactory opacification of the pulmonary arteries to the segmental level. No evidence of pulmonary embolism. Normal heart size. No pericardial effusion.   Mediastinum/Nodes: No enlarged mediastinal, hilar, or axillary lymph nodes. Thyroid gland, trachea, and esophagus demonstrate no significant findings.   Lungs/Pleura: Moderate to marked severity areas of biapical scarring and/or atelectasis are seen.   Multiple stable, bilateral subcentimeter calcified lung nodules are seen.   There is no evidence of acute infiltrate, pleural effusion or pneumothorax.   Upper Abdomen: There is a small hiatal hernia.   Musculoskeletal: No chest wall abnormality. No acute or significant osseous findings.   Review of the MIP images confirms the above findings.   IMPRESSION: 1. No evidence of pulmonary embolus or other acute intrathoracic process. 2. Moderate to marked severity biapical scarring and/or atelectasis. 3. Small hiatal hernia. 4. Aortic atherosclerosis.   Aortic Atherosclerosis (ICD10-I70.0).     Electronically Signed   By: Virgina Norfolk M.D.   On: 09/23/2021 20:13   Carotid Dopplers 09/13/2021  Summary:  Right Carotid: Velocities in the right ICA are consistent with a 1-39%  stenosis.                The ECA appears >50% stenosed.   Left Carotid: Velocities in the left ICA are consistent with a 40-59%   stenosis.               The ECA appears >50% stenosed.   Vertebrals:  Bilateral vertebral arteries demonstrate antegrade flow.  Subclavians: Normal flow hemodynamics were seen in bilateral subclavian               arteries.   *See table(s) above for measurements and observations.      Electronically signed by Orlie Pollen on 09/13/2021 at 5:12:09 PM.    Cardiac catheterization 01/09/2020 Conclusions: Mild, non-obstructive coronary artery disease, including 10-20% distal LMCA and 30% proximal RCA stenoses. Low left ventricular filling pressure.   Recommendations: Medical therapy and risk factor modification to prevent progression of coronary artery disease. Post-cath hydration.   Nelva Bush, MD [facility]  2D echo 01/07/2020 IMPRESSIONS     1. Left ventricular ejection fraction, by visual estimation, is 60 to  65%. The left ventricle has normal function. There is mildly increased  left ventricular hypertrophy.   2. The left ventricle has no regional wall motion abnormalities.   3. Global right ventricle has normal systolic function.The right  ventricular size is normal. No increase in right ventricular wall  thickness.   4. Left atrial size was normal.   5. Right atrial size was normal.   6. The mitral valve is normal in structure. No evidence of mitral valve  regurgitation. No evidence of mitral stenosis.   7. The tricuspid valve is normal in structure.   8. The aortic valve is normal in structure. Aortic valve regurgitation is  not visualized. No evidence of aortic valve sclerosis or stenosis.   9. The pulmonic valve was normal in structure. Pulmonic valve  regurgitation is not visualized.  10. TR signal is inadequate for assessing pulmonary artery systolic  pressure.  11. The inferior vena cava is normal in size with greater than 50%  respiratory variability, suggesting right atrial pressure of 3 mmHg.    PLAN:  In order of problems listed above:  CAD:   on cath 01/09/20, normal LVEF, aggressive risk factor mod-recent ER visit with chest pain  troponins negative, EKG unchanged.  She left after extended stay.  No recurrent chest pain. Continue current meds  HTN much better controlled with the addition of amlodipine    HLD managed by PCP. Target LDL < 70 Lipitor 10 mg daily   Carotid stenosis 40 to 59% LICA on Dopplers 5/63/8756 will update  Leg Pain:  statin holiday if better try crestor 5 mg If not f/u primary consider w/u for spinal stenosis Her LE pulses are plus 4   Shared Decision Making/Informed Consent      Carotid Duplex  4 week Lipitor holiday  F/U in a year   Medication Adjustments/Labs and Tests Ordered: Current medicines are reviewed at length with the patient today.  Concerns regarding medicines are outlined above.  Medication changes, Labs and Tests ordered today are listed in the Patient Instructions below. There are no Patient Instructions on file for this visit.    Signed, Jenkins Rouge, MD  10/10/2022 2:35 PM    [facility] [address], [place], [place]  [address] Phone: [phone]; Fax: [phone]

## 2022-10-10 ENCOUNTER — Ambulatory Visit: Payer: Medicare Other | Attending: Cardiovascular Disease | Admitting: Cardiovascular Disease

## 2022-10-10 ENCOUNTER — Encounter: Payer: Self-pay | Admitting: Cardiovascular Disease

## 2022-10-10 VITALS — BP 140/60 | HR 65 | Ht 63.0 in | Wt 152.8 lb

## 2022-10-10 DIAGNOSIS — I10 Essential (primary) hypertension: Secondary | ICD-10-CM | POA: Diagnosis not present

## 2022-10-10 DIAGNOSIS — I2510 Atherosclerotic heart disease of native coronary artery without angina pectoris: Secondary | ICD-10-CM | POA: Insufficient documentation

## 2022-10-10 DIAGNOSIS — E785 Hyperlipidemia, unspecified: Secondary | ICD-10-CM | POA: Diagnosis not present

## 2022-10-10 DIAGNOSIS — I6523 Occlusion and stenosis of bilateral carotid arteries: Secondary | ICD-10-CM | POA: Diagnosis not present

## 2022-10-10 NOTE — Patient Instructions (Signed)
Medication Instructions:  Your physician recommends that you continue on your current medications as directed. Please refer to the Current Medication list given to you today.  Hold Atorvastatin for 1 month see if leg pain stops.   *If you need a refill on your cardiac medications before your next appointment, please call your pharmacy*   Lab Work: NONE   If you have labs (blood work) drawn today and your tests are completely normal, you will receive your results only by: [place] (if you have MyChart) OR A paper copy in the mail If you have any lab test that is abnormal or we need to change your treatment, we will call you to review the results.   Testing/Procedures: NONE    Follow-Up: At [facility], you and your health needs are our priority.  As part of our continuing mission to provide you with exceptional heart care, we have created designated Provider Care Teams.  These Care Teams include your primary Cardiologist (physician) and Advanced Practice Providers (APPs -  Physician Assistants and Nurse Practitioners) who all work together to provide you with the care you need, when you need it.  We recommend signing up for the patient portal called "MyChart".  Sign up information is provided on this After Visit Summary.  MyChart is used to connect with patients for Virtual Visits (Telemedicine).  Patients are able to view lab/test results, encounter notes, upcoming appointments, etc.  Non-urgent messages can be sent to your provider as well.   To learn more about what you can do with MyChart, go to [email or web address].    Your next appointment:   1 year(s)  The format for your next appointment:   In Person  Provider:   Jenkins Rouge, MD    Other Instructions Thank you for choosing [facility]!    Important Information About Sugar

## 2022-10-20 DIAGNOSIS — I10 Essential (primary) hypertension: Secondary | ICD-10-CM | POA: Diagnosis not present

## 2022-10-30 DIAGNOSIS — X32XXXA Exposure to sunlight, initial encounter: Secondary | ICD-10-CM | POA: Diagnosis not present

## 2022-10-30 DIAGNOSIS — L570 Actinic keratosis: Secondary | ICD-10-CM | POA: Diagnosis not present

## 2022-10-30 DIAGNOSIS — Z08 Encounter for follow-up examination after completed treatment for malignant neoplasm: Secondary | ICD-10-CM | POA: Diagnosis not present

## 2022-10-30 DIAGNOSIS — L308 Other specified dermatitis: Secondary | ICD-10-CM | POA: Diagnosis not present

## 2022-10-30 DIAGNOSIS — Z85828 Personal history of other malignant neoplasm of skin: Secondary | ICD-10-CM | POA: Diagnosis not present

## 2022-11-19 DIAGNOSIS — I10 Essential (primary) hypertension: Secondary | ICD-10-CM | POA: Diagnosis not present

## 2022-11-27 DIAGNOSIS — L7211 Pilar cyst: Secondary | ICD-10-CM | POA: Diagnosis not present

## 2022-12-19 DIAGNOSIS — I10 Essential (primary) hypertension: Secondary | ICD-10-CM | POA: Diagnosis not present

## 2023-01-02 ENCOUNTER — Other Ambulatory Visit: Payer: Self-pay | Admitting: *Deleted

## 2023-01-02 DIAGNOSIS — I6529 Occlusion and stenosis of unspecified carotid artery: Secondary | ICD-10-CM

## 2023-01-16 ENCOUNTER — Ambulatory Visit (HOSPITAL_COMMUNITY)
Admission: RE | Admit: 2023-01-16 | Discharge: 2023-01-16 | Disposition: A | Discharge status: Home / Self Care | Payer: Medicare Other | Source: Ambulatory Visit | Attending: Vascular Surgery | Admitting: Vascular Surgery

## 2023-01-16 ENCOUNTER — Ambulatory Visit (INDEPENDENT_AMBULATORY_CARE_PROVIDER_SITE_OTHER): Payer: Medicare Other | Admitting: Physician Assistant

## 2023-01-16 VITALS — BP 150/67 | HR 57 | Temp 97.7°F | Resp 16 | Ht 63.0 in | Wt 153.5 lb

## 2023-01-16 DIAGNOSIS — I6529 Occlusion and stenosis of unspecified carotid artery: Secondary | ICD-10-CM | POA: Diagnosis not present

## 2023-01-16 NOTE — Progress Notes (Signed)
History of Present Illness:  Patient is a 77 y.o. year old female who presents for evaluation of carotid stenosis.  She had been found to have a carotid bruit in the past and in 2009 underwent duplex showing moderate 50-69% stenosis bilaterally at an outlying center.  The patient denies symptoms of TIA, amaurosis, or stroke.  She is on a daily ASA.     Past Medical History:  Diagnosis Date   Carotid artery stenosis    Colon polyps    Diverticulosis    GERD (gastroesophageal reflux disease)    Hyperlipidemia    Hypertension    Influenza A 01/01/2014   Kidney stones    Osteopenia    T-2.3   Pneumonia    Right renal artery stenosis ([place])    Syncope 12/31/2013   Vasovagal or hypovolemia/orthostatic    Past Surgical History:  Procedure Laterality Date   ABDOMINAL HYSTERECTOMY  1982   Partial hysterectomy   COLONOSCOPY N/A 07/30/2016   Procedure: COLONOSCOPY;  Surgeon: Daneil Dolin, MD;  [facility];  Service: Endoscopy;  Laterality: N/A;  8:30 AM   CT SCAN  July 2015   Chest   FLEXIBLE SIGMOIDOSCOPY  11/28/2021   Procedure: FLEXIBLE SIGMOIDOSCOPY;  Surgeon: [name], DO;  [facility];  Service: Endoscopy;;   LEFT HEART CATH AND CORONARY ANGIOGRAPHY N/A 01/09/2020   Procedure: LEFT HEART CATH AND CORONARY ANGIOGRAPHY;  Surgeon: Nelva Bush, MD;  Location: [facility];  Service: Cardiovascular;  Laterality: N/A;   POLYPECTOMY  07/30/2016   Procedure: POLYPECTOMY;  Surgeon: Daneil Dolin, MD;  [facility];  Service: Endoscopy;;  descending colon   SMALL INTESTINE SURGERY       Social History Social History   Tobacco Use   Smoking status: Former    Types: Cigarettes    Quit date: 12/22/1996    Years since quitting: 26.0   Smokeless tobacco: Never  Vaping Use   Vaping Use: Never used  Substance Use Topics   Alcohol use: No   Drug use: No    Family History Family History  Problem Relation Age of Onset    Cancer Mother        pancratic   Heart attack Mother    Cancer Father        stomach   Diabetes Father    Hyperlipidemia Father    Heart disease Father        NOT  before age 63   Cancer Sister        lung   Hyperlipidemia Sister    Cancer Brother        thyroid, prostate   Hyperlipidemia Brother    Heart attack Brother    Cancer Sister        colon   Hyperlipidemia Sister    Cancer Sister        colon   Hyperlipidemia Sister     Allergies  Allergies  Allergen Reactions   Tetanus Toxoids Swelling     Current Outpatient Medications  Medication Sig Dispense Refill   amLODipine (NORVASC) 5 MG tablet Take 5 mg by mouth daily.     aspirin EC 81 MG tablet Take 81 mg by mouth daily.     atorvastatin (LIPITOR) 10 MG tablet Take 10 mg by mouth 2 (two) times a week.     cetirizine (ZYRTEC) 5 MG tablet Take 1 tablet (5 mg total) by mouth daily. (Patient  taking differently: Take 5 mg by mouth daily. As needed) 30 tablet 0   cholecalciferol (VITAMIN D) 1000 UNITS tablet Take 1,000 Units by mouth daily.     fluticasone (FLONASE) 50 MCG/ACT nasal spray Place 2 sprays into both nostrils daily. (Patient taking differently: Place 2 sprays into both nostrils as needed.) 16 g 6   hydrochlorothiazide (HYDRODIURIL) 25 MG tablet TAKE 1 TABLET BY MOUTH EVERY DAY 90 tablet 2   losartan (COZAAR) 100 MG tablet TAKE 1 TABLET(100 MG) BY MOUTH DAILY 90 tablet 2   Current Facility-Administered Medications  Medication Dose Route Frequency Provider Last Rate Last Admin   0.9 %  sodium chloride infusion  500 mL Intravenous Once Sharyn Creamer, MD        ROS:   General:  No weight loss, Fever, chills  HEENT: No recent headaches, no nasal bleeding, no visual changes, no sore throat  Neurologic: No dizziness, blackouts, seizures. No recent symptoms of stroke or mini- stroke. No recent episodes of slurred speech, or temporary blindness.  Cardiac: No recent episodes of chest pain/pressure, no  shortness of breath at rest.  No shortness of breath with exertion.  Denies history of atrial fibrillation or irregular heartbeat  Vascular: No history of rest pain in feet.  No history of claudication.  No history of non-healing ulcer, No history of DVT   Pulmonary: No home oxygen, no productive cough, no hemoptysis,  No asthma or wheezing  Musculoskeletal:  '[ ]'$  Arthritis, '[ ]'$  Low back pain,  '[ ]'$  Joint pain  Hematologic:No history of hypercoagulable state.  No history of easy bleeding.  No history of anemia  Gastrointestinal: No hematochezia or melena,  No gastroesophageal reflux, no trouble swallowing  Urinary: '[ ]'$  chronic Kidney disease, '[ ]'$  on HD - '[ ]'$  MWF or '[ ]'$  TTHS, '[ ]'$  Burning with urination, '[ ]'$  Frequent urination, '[ ]'$  Difficulty urinating;   Skin: No rashes  Psychological: No history of anxiety,  No history of depression   Physical Examination  Vitals:   01/16/23 1239  BP: (!) 150/67  Pulse: (!) 57  Resp: 16  Temp: 97.7 F (36.5 C)  TempSrc: Temporal  SpO2: 98%  Weight: 153 lb 8 oz (69.6 kg)  Height: '5\' 3"'$  (1.6 m)    Body mass index is 27.19 kg/m.  General:  Alert and oriented, no acute distress HEENT: Normal Neck: No bruit or JVD Pulmonary: Clear to auscultation bilaterally Cardiac: Regular Rate and Rhythm without murmur Gastrointestinal: Soft, non-tender, non-distended, no mass, no scars Skin: No rash Extremity Pulses:  2+ radial, brachial, femoral, dorsalis pedis, posterior tibial pulses bilaterally Musculoskeletal: No deformity or edema  Neurologic: Upper and lower extremity motor 5/5 and symmetric  DATA:  Right Carotid Findings:  +----------+--------+--------+--------+-------------------------+--------+           PSV cm/sEDV cm/sStenosisPlaque Description       Comments  +----------+--------+--------+--------+-------------------------+--------+  CCA Prox  134     10                                                  +----------+--------+--------+--------+-------------------------+--------+  CCA Mid   80      15                                                 +----------+--------+--------+--------+-------------------------+--------+  CCA Distal76      15                                                 +----------+--------+--------+--------+-------------------------+--------+  ICA Prox  144     22      1-39%   calcific and heterogenous          +----------+--------+--------+--------+-------------------------+--------+  ICA Mid   110     26                                                 +----------+--------+--------+--------+-------------------------+--------+  ICA Distal81      20                                                 +----------+--------+--------+--------+-------------------------+--------+  ECA      222     21      >50%    heterogenous and calcific          +----------+--------+--------+--------+-------------------------+--------+   +----------+--------+-------+--------+-------------------+           PSV cm/sEDV cmsDescribeArm Pressure (mmHG)  +----------+--------+-------+--------+-------------------+  Subclavian145                   156                  +----------+--------+-------+--------+-------------------+   +---------+--------+--+--------+-+  VertebralPSV cm/s60EDV cm/s9  +---------+--------+--+--------+-+      Left Carotid Findings:  +----------+--------+--------+--------+-------------------------+--------+           PSV cm/sEDV cm/sStenosisPlaque Description       Comments  +----------+--------+--------+--------+-------------------------+--------+  CCA Prox  64      9                                                  +----------+--------+--------+--------+-------------------------+--------+  CCA Mid   65      13              heterogenous                        +----------+--------+--------+--------+-------------------------+--------+  CCA Distal67      14                                                 +----------+--------+--------+--------+-------------------------+--------+  ICA Prox  148     24      1-39%   heterogenous and calcific          +----------+--------+--------+--------+-------------------------+--------+  ICA Mid   183     24                                                 +----------+--------+--------+--------+-------------------------+--------+  ICA Distal72      17                                                 +----------+--------+--------+--------+-------------------------+--------+  ECA      221     11      >50%    heterogenous and calcific          +----------+--------+--------+--------+-------------------------+--------+   +----------+--------+--------+--------+-------------------+           PSV cm/sEDV cm/sDescribeArm Pressure (mmHG)  +----------+--------+--------+--------+-------------------+  Subclavian111                    151                  +----------+--------+--------+--------+-------------------+   +---------+--------+--+--------+-+  VertebralPSV cm/s45EDV cm/s7  +---------+--------+--+--------+-+    Summary:  Right Carotid: Velocities in the right ICA are consistent with a 1-39%  stenosis.                The ECA appears >50% stenosed.   Left Carotid: Velocities in the left ICA are consistent with a 1-39%  stenosis.               The ECA appears >50% stenosed.   Vertebrals:  Bilateral vertebral arteries demonstrate antegrade flow.  Subclavians: Normal flow hemodynamics were seen in bilateral subclavian               arteries.   ASSESSMENT:  Asymptomatic Carotid stenosis The carotid duplex is accentually the same.  She remains asymptomatic.      PLAN:She work 4 days a week and takes ASA and Statin daily.  He ICA's have been < 39% for many  years.  I will have her return in 2 years for repeat carotid surveillance.  If she has symptoms of stroke she will call 911.   Roxy Horseman PA-C [facility] Office: [phone]  MD on call Donzetta Matters

## 2023-01-19 DIAGNOSIS — I10 Essential (primary) hypertension: Secondary | ICD-10-CM | POA: Diagnosis not present

## 2023-02-04 ENCOUNTER — Emergency Department (HOSPITAL_COMMUNITY): Payer: Medicare Other

## 2023-02-04 ENCOUNTER — Encounter: Payer: Self-pay | Admitting: Emergency Medicine

## 2023-02-04 ENCOUNTER — Other Ambulatory Visit: Payer: Self-pay

## 2023-02-04 ENCOUNTER — Ambulatory Visit (INDEPENDENT_AMBULATORY_CARE_PROVIDER_SITE_OTHER): Payer: Medicare Other

## 2023-02-04 ENCOUNTER — Ambulatory Visit
Admission: EM | Admit: 2023-02-04 | Discharge: 2023-02-04 | Disposition: A | Discharge status: Home / Self Care | Payer: Medicare Other | Attending: Family Medicine | Admitting: Family Medicine

## 2023-02-04 ENCOUNTER — Encounter (HOSPITAL_COMMUNITY): Payer: Self-pay | Admitting: Emergency Medicine

## 2023-02-04 ENCOUNTER — Emergency Department (HOSPITAL_COMMUNITY)
Admission: EM | Admit: 2023-02-04 | Discharge: 2023-02-04 | Disposition: A | Discharge status: Home / Self Care | Payer: Medicare Other | Attending: Emergency Medicine | Admitting: Emergency Medicine

## 2023-02-04 DIAGNOSIS — K5790 Diverticulosis of intestine, part unspecified, without perforation or abscess without bleeding: Secondary | ICD-10-CM | POA: Diagnosis not present

## 2023-02-04 DIAGNOSIS — R030 Elevated blood-pressure reading, without diagnosis of hypertension: Secondary | ICD-10-CM | POA: Diagnosis not present

## 2023-02-04 DIAGNOSIS — Z90710 Acquired absence of both cervix and uterus: Secondary | ICD-10-CM | POA: Diagnosis not present

## 2023-02-04 DIAGNOSIS — I700 Atherosclerosis of aorta: Secondary | ICD-10-CM | POA: Diagnosis not present

## 2023-02-04 DIAGNOSIS — Z7982 Long term (current) use of aspirin: Secondary | ICD-10-CM | POA: Insufficient documentation

## 2023-02-04 DIAGNOSIS — R109 Unspecified abdominal pain: Secondary | ICD-10-CM | POA: Diagnosis not present

## 2023-02-04 DIAGNOSIS — M545 Low back pain, unspecified: Secondary | ICD-10-CM | POA: Diagnosis not present

## 2023-02-04 DIAGNOSIS — R110 Nausea: Secondary | ICD-10-CM

## 2023-02-04 DIAGNOSIS — Z79899 Other long term (current) drug therapy: Secondary | ICD-10-CM | POA: Insufficient documentation

## 2023-02-04 LAB — CBC
HCT: 34.8 % — ABNORMAL LOW (ref 36.0–46.0)
Hemoglobin: 12.2 g/dL (ref 12.0–15.0)
MCH: 32.2 pg (ref 26.0–34.0)
MCHC: 35.1 g/dL (ref 30.0–36.0)
MCV: 91.8 fL (ref 80.0–100.0)
Platelets: 237 10*3/uL (ref 150–400)
RBC: 3.79 MIL/uL — ABNORMAL LOW (ref 3.87–5.11)
RDW: 12.2 % (ref 11.5–15.5)
WBC: 7.1 10*3/uL (ref 4.0–10.5)
nRBC: 0 % (ref 0.0–0.2)

## 2023-02-04 LAB — HEPATIC FUNCTION PANEL
ALT: 17 U/L (ref 0–44)
AST: 25 U/L (ref 15–41)
Albumin: 4.3 g/dL (ref 3.5–5.0)
Alkaline Phosphatase: 77 U/L (ref 38–126)
Bilirubin, Direct: <0.1 mg/dL (ref 0.0–0.2)
Total Bilirubin: 0.4 mg/dL (ref 0.3–1.2)
Total Protein: 7 g/dL (ref 6.5–8.1)

## 2023-02-04 LAB — POCT URINALYSIS DIP (MANUAL ENTRY)
Bilirubin, UA: NEGATIVE
Blood, UA: NEGATIVE
Glucose, UA: NEGATIVE mg/dL
Ketones, POC UA: NEGATIVE mg/dL
Leukocytes, UA: NEGATIVE
Nitrite, UA: NEGATIVE
Protein Ur, POC: NEGATIVE mg/dL
Spec Grav, UA: 1.025 (ref 1.010–1.025)
Urobilinogen, UA: 0.2 E.U./dL
pH, UA: 7 (ref 5.0–8.0)

## 2023-02-04 LAB — BASIC METABOLIC PANEL
Anion gap: 11 (ref 5–15)
BUN: 16 mg/dL (ref 8–23)
CO2: 23 mmol/L (ref 22–32)
Calcium: 9 mg/dL (ref 8.9–10.3)
Chloride: 100 mmol/L (ref 98–111)
Creatinine, Ser: 0.76 mg/dL (ref 0.44–1.00)
GFR, Estimated: >60 mL/min (ref >60)
Glucose, Bld: 140 mg/dL — ABNORMAL HIGH (ref 70–99)
Potassium: 3.4 mmol/L — ABNORMAL LOW (ref 3.5–5.1)
Sodium: 134 mmol/L — ABNORMAL LOW (ref 135–145)

## 2023-02-04 MED ORDER — KETOROLAC TROMETHAMINE 30 MG/ML IJ SOLN
30.0000 mg | Freq: Once | INTRAMUSCULAR | Status: AC
  Administered 2023-02-04: 30 mg via INTRAMUSCULAR

## 2023-02-04 MED ORDER — HYDROMORPHONE HCL 1 MG/ML IJ SOLN
0.5000 mg | Freq: Once | INTRAMUSCULAR | Status: AC
  Administered 2023-02-04: 0.5 mg via INTRAVENOUS
  Filled 2023-02-04: qty 0.5

## 2023-02-04 MED ORDER — LIDOCAINE 5 % EX PTCH: 1.0000 | MEDICATED_PATCH | CUTANEOUS | 0 refills | Status: DC

## 2023-02-04 MED ORDER — LIDOCAINE 5 % EX PTCH
1.0000 | MEDICATED_PATCH | CUTANEOUS | Status: DC
  Administered 2023-02-04: 1 via TRANSDERMAL
  Filled 2023-02-04: qty 1

## 2023-02-04 NOTE — ED Notes (Signed)
Pt reports she just had a Urinalysis done at Urgent Care today.

## 2023-02-04 NOTE — ED Notes (Signed)
Patient is being discharged from the Urgent Care and sent to the Emergency Department via private vehicle . Per PA, patient is in need of higher level of care due to severe back pain. Patient is aware and verbalizes understanding of plan of care.  Vitals:   02/04/23 1349  BP: (!) 176/61  Pulse: 94  Resp: (!) 22  Temp: 97.6 F (36.4 C)  SpO2: 96%

## 2023-02-04 NOTE — ED Triage Notes (Signed)
Pain around waist line on back x 1 hour.  States she feels nauseated.  States loose BM today.

## 2023-02-04 NOTE — ED Notes (Signed)
Patient's daughter came out of room and states, we need to go to the ER, she is in a lot of pain and the pain shot is not helping.  Patient pacing in room.  Patient left stating she was going to the ED because we can't help her here.

## 2023-02-04 NOTE — ED Provider Notes (Signed)
[facility]    CSN: [id number] Arrival date & time: 02/04/23  1343      History   Chief Complaint No chief complaint on file.   HPI Stacey Moody is a 77 y.o. female.   Patient presenting today with new onset right severe flank pain that started about an hour ago after a loose bowel movement.  States she was feeling in her usual state of health prior to onset of pain and no known injury prior to onset.  Does have some nausea that she feels is related to the level of her pain but no dysuria, hematuria, vomiting, chest pain, shortness of breath, dizziness.  She denies any aggravating or alleviating factors.  Took some Tylenol about 30 minutes ago with no relief noticed yet.  Does have a history of kidney stones that have felt similar.    Past Medical History:  Diagnosis Date   Carotid artery stenosis    Colon polyps    Diverticulosis    GERD (gastroesophageal reflux disease)    Hyperlipidemia    Hypertension    Influenza A 01/01/2014   Kidney stones    Osteopenia    T-2.3   Pneumonia    Right renal artery stenosis ([place])    Syncope 12/31/2013   Vasovagal or hypovolemia/orthostatic    Patient Active Problem List   Diagnosis Date Noted   Unstable angina ([place])    Chest pain 01/07/2020   Right renal artery stenosis ([place])    Osteopenia    Abdominal pain    Legionella infection ([place])    Gastroenteritis 12/31/2014   Elevated transaminase level 12/31/2014   CAP (community acquired pneumonia) 12/31/2014   Nausea vomiting and diarrhea 12/30/2014   Hypokalemia 12/30/2014   Hyponatremia 12/30/2014   Dehydration 12/30/2014   Fever 12/30/2014   Other pancytopenia ([place]) 01/03/2014   Carotid artery stenosis 01/03/2014   Vertigo 01/02/2014   Right otitis media 01/02/2014   Leukopenia 01/02/2014   Influenza A 01/01/2014   Scalp hematoma 01/01/2014   Syncope 12/31/2013   Diarrhea 12/31/2013   URI (upper respiratory infection) 12/31/2013   Hypertension     Hyperlipidemia    GERD (gastroesophageal reflux disease)    Occlusion and stenosis of carotid artery without mention of cerebral infarction 06/22/2012    Past Surgical History:  Procedure Laterality Date   ABDOMINAL HYSTERECTOMY  1982   Partial hysterectomy   COLONOSCOPY N/A 07/30/2016   Procedure: COLONOSCOPY;  Surgeon: Daneil Dolin, MD;  [facility];  Service: Endoscopy;  Laterality: N/A;  8:30 AM   CT SCAN  July 2015   Chest   FLEXIBLE SIGMOIDOSCOPY  11/28/2021   Procedure: FLEXIBLE SIGMOIDOSCOPY;  Surgeon: [name], DO;  [facility];  Service: Endoscopy;;   LEFT HEART CATH AND CORONARY ANGIOGRAPHY N/A 01/09/2020   Procedure: LEFT HEART CATH AND CORONARY ANGIOGRAPHY;  Surgeon: Nelva Bush, MD;  Location: [facility];  Service: Cardiovascular;  Laterality: N/A;   POLYPECTOMY  07/30/2016   Procedure: POLYPECTOMY;  Surgeon: Daneil Dolin, MD;  [facility];  Service: Endoscopy;;  descending colon   SMALL INTESTINE SURGERY      OB History   No obstetric history on file.      Home Medications    Prior to Admission medications   Medication Sig Start Date End Date Taking? Authorizing Provider  amLODipine (NORVASC) 5 MG tablet Take 5 mg by mouth daily. 09/26/21   [provider]  aspirin EC 81 MG tablet Take 81 mg by mouth daily.    [provider]  atorvastatin (LIPITOR) 10 MG tablet Take 10 mg by mouth 2 (two) times a week. 09/24/21   [provider]  cetirizine (ZYRTEC) 5 MG tablet Take 1 tablet (5 mg total) by mouth daily. Patient taking differently: Take 5 mg by mouth daily. As needed 03/18/22   Jaynee Eagles, PA-C  cholecalciferol (VITAMIN D) 1000 UNITS tablet Take 1,000 Units by mouth daily.    [provider]  fluticasone (FLONASE) 50 MCG/ACT nasal spray Place 2 sprays into both nostrils daily. Patient taking differently: Place 2 sprays into both nostrils as needed. 01/09/20   Antonieta Pert, MD   hydrochlorothiazide (HYDRODIURIL) 25 MG tablet TAKE 1 TABLET BY MOUTH EVERY DAY 06/15/20   Alycia Rossetti, MD  losartan (COZAAR) 100 MG tablet TAKE 1 TABLET(100 MG) BY MOUTH DAILY 06/15/20   Alycia Rossetti, MD    Family History Family History  Problem Relation Age of Onset   Cancer Mother        pancratic   Heart attack Mother    Cancer Father        stomach   Diabetes Father    Hyperlipidemia Father    Heart disease Father        NOT  before age 36   Cancer Sister        lung   Hyperlipidemia Sister    Cancer Brother        thyroid, prostate   Hyperlipidemia Brother    Heart attack Brother    Cancer Sister        colon   Hyperlipidemia Sister    Cancer Sister        colon   Hyperlipidemia Sister     Social History Social History   Tobacco Use   Smoking status: Former    Types: Cigarettes    Quit date: 12/22/1996    Years since quitting: 26.1   Smokeless tobacco: Never  Vaping Use   Vaping Use: Never used  Substance Use Topics   Alcohol use: No   Drug use: No     Allergies   Tetanus toxoids   Review of Systems Review of Systems Per HPI  Physical Exam Triage Vital Signs ED Triage Vitals  Enc Vitals Group     BP 02/04/23 1349 (!) 176/61     Pulse Rate 02/04/23 1349 94     Resp 02/04/23 1349 (!) 22     Temp 02/04/23 1349 97.6 F (36.4 C)     Temp Source 02/04/23 1349 Oral     SpO2 02/04/23 1349 96 %     Weight --      Height --      Head Circumference --      Peak Flow --      Pain Score 02/04/23 1350 10     Pain Loc --      Pain Edu? --      Excl. in [place]? --    No data found.  Updated Vital Signs BP (!) 176/61 (BP Location: Right Arm)   Pulse 94   Temp 97.6 F (36.4 C) (Oral)   Resp (!) 22   SpO2 96%   Visual Acuity Right Eye Distance:   Left Eye Distance:   Bilateral Distance:    Right Eye Near:   Left Eye Near:    Bilateral Near:     Physical Exam Vitals and nursing note  reviewed.  Constitutional:      Appearance:  Normal appearance. She is not ill-appearing.  HENT:     Head: Atraumatic.     Mouth/Throat:     Mouth: Mucous membranes are moist.  Eyes:     Extraocular Movements: Extraocular movements intact.     Conjunctiva/sclera: Conjunctivae normal.  Cardiovascular:     Rate and Rhythm: Normal rate and regular rhythm.     Heart sounds: Normal heart sounds.  Pulmonary:     Effort: Pulmonary effort is normal.     Breath sounds: Normal breath sounds.  Musculoskeletal:        General: Tenderness present. No swelling or signs of injury. Normal range of motion.     Cervical back: Normal range of motion and neck supple.     Comments: Tender to palpation right lateral lumbar region, range of motion and gait intact, negative straight leg raise, no midline spinal tenderness to palpation diffusely  Skin:    General: Skin is warm and dry.  Neurological:     Mental Status: She is alert and oriented to person, place, and time.     Motor: No weakness.     Gait: Gait normal.  Psychiatric:        Mood and Affect: Mood normal.        Thought Content: Thought content normal.        Judgment: Judgment normal.      UC Treatments / Results  Labs (all labs ordered are listed, but only abnormal results are displayed) Labs Reviewed  POCT URINALYSIS DIP (MANUAL ENTRY)    EKG   Radiology DG Abd 1 View  Result Date: 02/04/2023 CLINICAL DATA:  Acute low back pain for the past hour.  Nausea. EXAM: ABDOMEN - 1 VIEW COMPARISON:  Abdominal x-ray dated July 17, 2022. FINDINGS: The bowel gas pattern is normal. No excessive colonic stool burden. No radio-opaque calculi or other significant radiographic abnormality are seen. Unchanged bone island in the right sacral ala. IMPRESSION: Negative. Electronically Signed   By: Titus Dubin M.D.   On: 02/04/2023 14:28    Procedures Procedures (including critical care time)  Medications Ordered in UC Medications  ketorolac (TORADOL) 30 MG/ML injection 30 mg (30 mg  Intramuscular Given 02/04/23 1417)    Initial Impression / Assessment and Plan / UC Course  I have reviewed the triage vital signs and the nursing notes.  Pertinent labs & imaging results that were available during my care of the patient were reviewed by me and considered in my medical decision making (see chart for details).     Hypertensive in triage, otherwise vital signs reassuring.  She is overall well-appearing and in no acute distress though does appear to be in some pain.  Urinalysis without obvious abnormalities, IM Toradol given for pain and abdominal ultrasound ordered for further evaluation for kidney stone though discussed with patient that these rarely are able to be seen on x-ray.  Her exam overall is very reassuring with no red flag findings.  Shortly after x-ray was performed and prior to getting results, patient and daughter decided they wanted to go to the emergency department due to the level of her pain and not having any stronger pain medication onsite.  They left prior to completion of visit to go private vehicle to the hospital per their request.  Final Clinical Impressions(s) / UC Diagnoses   Final diagnoses:  Right flank pain  Elevated blood pressure reading   Discharge Instructions  None    ED Prescriptions   None    PDMP not reviewed this encounter.   Volney American, [place] 02/04/23 1726

## 2023-02-04 NOTE — Discharge Instructions (Addendum)
Please pick up the lidocaine patches I have prescribed for you and use as prescribed.  You may also take Tylenol as needed every 6 hours for pain.  Please schedule an appoint with your primary care provider to be reevaluated in the next few days regarding recent ER visit and symptoms.  If symptoms worsen please return to ER.

## 2023-02-04 NOTE — ED Provider Notes (Signed)
[facility] AT [facility] Provider Note   CSN: [id number] Arrival date & time: 02/04/23  1436     History  Chief Complaint  Patient presents with   Flank Pain    Stacey Moody is a 77 y.o. female scented with right flank pain after having a bowel movement around noon today.  Patient went to the urgent care and had a clean urine sample and received shot of Toradol however patient still has persistent pain.  Patient stated pain is 10 out of 10 located in the right flank that does not radiate.  Patient stated movement and palpation exacerbate the pain.  Patient denied chest pain, shortness of breath, syncope, abdominal pain, changes in sensation/motor skills, new onset weakness, nausea/vomiting   Home Medications Prior to Admission medications   Medication Sig Start Date End Date Taking? Authorizing Provider  lidocaine (LIDODERM) 5 % Place 1 patch onto the skin daily. Remove & Discard patch within 12 hours or as directed by MD 02/04/23  Yes Herbert Aguinaldo, Beverly Gust, PA-C  amLODipine (NORVASC) 5 MG tablet Take 5 mg by mouth daily. 09/26/21   [provider]  aspirin EC 81 MG tablet Take 81 mg by mouth daily.    [provider]  atorvastatin (LIPITOR) 10 MG tablet Take 10 mg by mouth 2 (two) times a week. 09/24/21   [provider]  cetirizine (ZYRTEC) 5 MG tablet Take 1 tablet (5 mg total) by mouth daily. Patient taking differently: Take 5 mg by mouth daily. As needed 03/18/22   Wallis Bamberg, PA-C  cholecalciferol (VITAMIN D) 1000 UNITS tablet Take 1,000 Units by mouth daily.    [provider]  fluticasone (FLONASE) 50 MCG/ACT nasal spray Place 2 sprays into both nostrils daily. Patient taking differently: Place 2 sprays into both nostrils as needed. 01/09/20   Lanae Boast, MD  hydrochlorothiazide (HYDRODIURIL) 25 MG tablet TAKE 1 TABLET BY MOUTH EVERY DAY 06/15/20   Salley Scarlet, MD  losartan (COZAAR) 100 MG tablet TAKE 1  TABLET(100 MG) BY MOUTH DAILY 06/15/20   Salley Scarlet, MD      Allergies    Tetanus toxoids    Review of Systems   Review of Systems  Genitourinary:  Positive for flank pain.  See HPI  Physical Exam Updated Vital Signs BP (!) 147/58   Pulse 84   Temp 98.4 F (36.9 C) (Oral)   Resp 20   Ht 5\' 3"  (1.6 m)   Wt 70 kg   SpO2 98%   BMI 27.34 kg/m  Physical Exam Vitals and nursing note reviewed.  Constitutional:      General: She is not in acute distress.    Appearance: She is well-developed.     Comments: Patient appeared in pain and trying to remain still as movement exacerbated the pain  HENT:     Head: Normocephalic and atraumatic.  Eyes:     Extraocular Movements: Extraocular movements intact.     Conjunctiva/sclera: Conjunctivae normal.     Pupils: Pupils are equal, round, and reactive to light.  Cardiovascular:     Rate and Rhythm: Normal rate and regular rhythm.     Heart sounds: No murmur heard.    Comments: 2+ bilateral radial/posterior tibialis pulses with regular rate Pulmonary:     Effort: Pulmonary effort is normal. No respiratory distress.     Breath sounds: Normal breath sounds.  Abdominal:     Palpations: Abdomen is soft.  Tenderness: There is no abdominal tenderness. There is right CVA tenderness. There is no left CVA tenderness, guarding or rebound.  Musculoskeletal:        General: No swelling.     Cervical back: Normal range of motion and neck supple.     Comments: Patient is able to range fully in all 4 limbs and able to sit up on her own but endorsed pain  Skin:    General: Skin is warm and dry.     Capillary Refill: Capillary refill takes less than 2 seconds.  Neurological:     General: No focal deficit present.     Mental Status: She is alert and oriented to person, place, and time.     Comments: Sensation is intact distally in all 4 limbs  Psychiatric:        Mood and Affect: Mood normal.     ED Results / Procedures / Treatments    Labs (all labs ordered are listed, but only abnormal results are displayed) Labs Reviewed  BASIC METABOLIC PANEL - Abnormal; Notable for the following components:      Result Value   Sodium 134 (*)    Potassium 3.4 (*)    Glucose, Bld 140 (*)    All other components within normal limits  CBC - Abnormal; Notable for the following components:   RBC 3.79 (*)    HCT 34.8 (*)    All other components within normal limits  HEPATIC FUNCTION PANEL    EKG None  Radiology CT Renal Stone Study  Result Date: 02/04/2023 CLINICAL DATA:  Abdominal/flank pain, stone suspected EXAM: CT ABDOMEN AND PELVIS WITHOUT CONTRAST TECHNIQUE: Multidetector CT imaging of the abdomen and pelvis was performed following the standard protocol without IV contrast. RADIATION DOSE REDUCTION: This exam was performed according to the departmental dose-optimization program which includes automated exposure control, adjustment of the mA and/or kV according to patient size and/or use of iterative reconstruction technique. COMPARISON:  None Available. FINDINGS: Lower chest: No acute abnormality. Hepatobiliary: No focal liver abnormality is seen. No gallstones, gallbladder wall thickening, or biliary dilatation. Pancreas: Unremarkable. No pancreatic ductal dilatation or surrounding inflammatory changes. Spleen: Normal in size without focal abnormality. Adrenals/Urinary Tract: Unremarkable adrenal glands. No renal or ureteric calculi identified. There is chronic mild prominence of the right collecting system mid proximal right ureter. Unremarkable bladder. Stomach/Bowel: Diverticulosis without evidence of diverticulitis. No findings to suggest bowel obstruction. Vascular/Lymphatic: Aortic atherosclerosis. No enlarged abdominal or pelvic lymph nodes. Reproductive: Status post hysterectomy. No adnexal masses. Other: No abdominal wall hernia or abnormality. No abdominopelvic ascites. Musculoskeletal: No acute findings. IMPRESSION: 1. No  renal or ureteric calculi identified. There is chronic mild prominence of the right collecting system mid proximal right ureter 2. Diverticulosis without evidence of diverticulitis. Electronically Signed   By: Feliberto Harts M.D.   On: 02/04/2023 16:42   DG Abd 1 View  Result Date: 02/04/2023 CLINICAL DATA:  Acute low back pain for the past hour.  Nausea. EXAM: ABDOMEN - 1 VIEW COMPARISON:  Abdominal x-ray dated July 17, 2022. FINDINGS: The bowel gas pattern is normal. No excessive colonic stool burden. No radio-opaque calculi or other significant radiographic abnormality are seen. Unchanged bone island in the right sacral ala. IMPRESSION: Negative. Electronically Signed   By: Obie Dredge M.D.   On: 02/04/2023 14:28    Procedures Procedures    Medications Ordered in ED Medications  lidocaine (LIDODERM) 5 % 1 patch (1 patch Transdermal Patch Applied 02/04/23 1740)  HYDROmorphone (DILAUDID) injection 0.5 mg (has no administration in time range)  HYDROmorphone (DILAUDID) injection 0.5 mg (0.5 mg Intravenous Given 02/04/23 1540)    ED Course/ Medical Decision Making/ A&P                             Medical Decision Making Amount and/or Complexity of Data Reviewed Labs: ordered.   [name] 77 y.o. presented today for right flank pain. Working DDx that I considered at this time includes, but not limited to, cholecystitis, hepatitis, pancreatitis, nephrolithiasis, AAA, pyelonephritis, MSK.  Review of prior external notes: None  Unique Tests and My Interpretation:  Hepatic function panel: Unremarkable BMP: Unremarkable CBC: Unremarkable CT renal stone: Diverticulosis without diverticulitis, no calculi noted  Discussion with Independent Historian: Daughter  Discussion of Management of Tests: None  Risk:    Medium:  - prescription drug management  Risk Stratification Score: None  Staffed with Rhunette Croft, MD  R/o DDx: Cholecystitis, hepatitis, pancreatitis,  nephrolithiasis, AAA, pyonephritis: CT was negative and labs were unremarkable along with an unremarkable urinalysis that was performed at the urgent care  Plan: Patient presented for right flank pain.  Patient received a urinalysis at the urgent care she previously went to which was unremarkable and would not be repeated here.  Labs were obtained along with Dilaudid for symptom management and a CT renal stone study was ordered as patient had right CVA tenderness on exam.  Patient stable vitals at this time.  Labs were unremarkable and the CT did not show any nephrolithiasis or acute causes for patient's pain.  Patient's vitals are stable at this time and I spoke with the patient about taking Tylenol as needed for pain every 6 hours and following up with her primary care provider.  I will also prescribe lidocaine patches as this may be musculoskeletal in nature.  Patient was given return precautions.patient stable for discharge at this time.  Patient verbalized understanding of plan.         Final Clinical Impression(s) / ED Diagnoses Final diagnoses:  Right flank pain  Diverticulosis    Rx / DC Orders ED Discharge Orders          Ordered    lidocaine (LIDODERM) 5 %  Every 24 hours        02/04/23 1804              Remi Deter 02/04/23 1805    Derwood Kaplan, MD 02/05/23 1538

## 2023-02-04 NOTE — ED Triage Notes (Signed)
Pt c/o of right sided flank pain. Pt states pain continues to intensify and she has had no relief. Was seen at urgent care who sent her here. Hx of kidney stones.

## 2023-02-13 DIAGNOSIS — I10 Essential (primary) hypertension: Secondary | ICD-10-CM | POA: Diagnosis not present

## 2023-02-13 DIAGNOSIS — R0789 Other chest pain: Secondary | ICD-10-CM | POA: Diagnosis not present

## 2023-02-13 DIAGNOSIS — I779 Disorder of arteries and arterioles, unspecified: Secondary | ICD-10-CM | POA: Diagnosis not present

## 2023-02-13 DIAGNOSIS — Z299 Encounter for prophylactic measures, unspecified: Secondary | ICD-10-CM | POA: Diagnosis not present

## 2023-02-13 DIAGNOSIS — I700 Atherosclerosis of aorta: Secondary | ICD-10-CM | POA: Diagnosis not present

## 2023-02-18 DIAGNOSIS — I10 Essential (primary) hypertension: Secondary | ICD-10-CM | POA: Diagnosis not present

## 2023-02-19 DIAGNOSIS — E7800 Pure hypercholesterolemia, unspecified: Secondary | ICD-10-CM | POA: Diagnosis not present

## 2023-02-19 DIAGNOSIS — I10 Essential (primary) hypertension: Secondary | ICD-10-CM | POA: Diagnosis not present

## 2023-02-24 ENCOUNTER — Encounter: Payer: Self-pay | Admitting: Internal Medicine

## 2023-02-24 ENCOUNTER — Ambulatory Visit (INDEPENDENT_AMBULATORY_CARE_PROVIDER_SITE_OTHER): Payer: Medicare Other | Admitting: Internal Medicine

## 2023-02-24 VITALS — BP 156/70 | HR 72 | Ht 63.0 in | Wt 153.1 lb

## 2023-02-24 DIAGNOSIS — I10 Essential (primary) hypertension: Secondary | ICD-10-CM | POA: Diagnosis not present

## 2023-02-24 DIAGNOSIS — R109 Unspecified abdominal pain: Secondary | ICD-10-CM | POA: Diagnosis not present

## 2023-02-24 DIAGNOSIS — E559 Vitamin D deficiency, unspecified: Secondary | ICD-10-CM

## 2023-02-24 DIAGNOSIS — E785 Hyperlipidemia, unspecified: Secondary | ICD-10-CM | POA: Diagnosis not present

## 2023-02-24 DIAGNOSIS — M546 Pain in thoracic spine: Secondary | ICD-10-CM | POA: Diagnosis not present

## 2023-02-24 NOTE — Patient Instructions (Addendum)
Thank you, Ms.Dustee T Compton for allowing [place] to provide your care today.   I have ordered the following labs for you:  Lab Orders         BMP8+EGFR         VITAMIN D 25 Hydroxy (Vit-D Deficiency, Fractures)         Urinalysis, Routine w reflex microscopic        Reminders: Your blood pressure is elevated today. We will check it one more time before you leave.  Recommend monitoring it at home and following up if it is staying elevated ( > 140/80) over the next 2 weeks.   I will follow-up with the results of your lab work and recommendations for your back pain.    Tamsen Snider, M.D.

## 2023-02-24 NOTE — Assessment & Plan Note (Signed)
Patient recently stopped taking atorvastatin 10 mg per recommendation of her cardiologist due to muscle cramps.  Muscle cramps are improving but have not resolved.  She is going to continue to hold atorvastatin and follow-up with cardiologist or PCP in 3 month to discuss starting alternative statin. Will not check lipid panel today as it will not change management.

## 2023-02-24 NOTE — Progress Notes (Unsigned)
HPI:[name] is a 77 y.o. female who presents to establish care and possible kidney stone. For the details of today's visit, please refer to the assessment and plan.    Past Medical History:  Diagnosis Date   Carotid artery stenosis    Colon polyps    Diverticulosis    Hyperlipidemia    Hypertension    Kidney stones    Osteopenia    T-2.3   Right renal artery stenosis [facility])     Past Surgical History:  Procedure Laterality Date   ABDOMINAL HYSTERECTOMY  12/22/1980   Partial hysterectomy   COLONOSCOPY N/A 07/30/2016   Procedure: COLONOSCOPY;  Surgeon: Stacey Dolin, MD;  [facility];  Service: Endoscopy;  Laterality: N/A;  8:30 AM   FLEXIBLE SIGMOIDOSCOPY  11/28/2021   Procedure: FLEXIBLE SIGMOIDOSCOPY;  Surgeon: Stacey Harman, DO;  [facility];  Service: Endoscopy;;   LEFT HEART CATH AND CORONARY ANGIOGRAPHY N/A 01/09/2020   Procedure: LEFT HEART CATH AND CORONARY ANGIOGRAPHY;  Surgeon: Stacey Bush, MD;  Location: [facility];  Service: Cardiovascular;  Laterality: N/A;   POLYPECTOMY  07/30/2016   Procedure: POLYPECTOMY;  Surgeon: Stacey Dolin, MD;  [facility];  Service: Endoscopy;;  descending colon   SMALL INTESTINE SURGERY      Family History  Problem Relation Age of Onset   Cancer Mother        pancratic   Heart attack Mother    Cancer Father        stomach   Diabetes Father    Hyperlipidemia Father    Heart disease Father        NOT  before age 41   Cancer Sister        lung   Hyperlipidemia Sister    Cancer Brother        thyroid, prostate   Hyperlipidemia Brother    Heart attack Brother    Cancer Sister        colon   Hyperlipidemia Sister    Cancer Sister        colon   Hyperlipidemia Sister     Social History   Tobacco Use   Smoking status: Former    Types: Cigarettes    Quit date: 12/22/1996    Years since quitting: 26.1   Smokeless tobacco: Never  Vaping Use   Vaping Use:  Never used  Substance Use Topics   Alcohol use: No   Drug use: No    Physical Exam: Vitals:   02/24/23 0826  BP: (!) 156/70  Pulse: 72  SpO2: 96%  Weight: 153 lb 1.9 oz (69.5 kg)  Height: '5\' 3"'$  (1.6 m)     Physical Exam Constitutional:      General: She is not in acute distress.    Appearance: She is not ill-appearing.  Cardiovascular:     Rate and Rhythm: Normal rate and regular rhythm.  Pulmonary:     Effort: Pulmonary effort is normal. No respiratory distress.     Breath sounds: No wheezing or rales.  Abdominal:     General: There is no distension.     Tenderness: There is right CVA tenderness and guarding. There is no left CVA tenderness.  Musculoskeletal:     Right lower leg: No edema.     Left lower leg: No edema.      Assessment & Plan:   Hyperlipidemia Patient recently stopped taking atorvastatin 10 mg per recommendation  of her cardiologist due to muscle cramps.  Muscle cramps are improving but have not resolved.  She is going to continue to hold atorvastatin and follow-up with cardiologist or PCP in 3 month to discuss starting alternative statin. Will not check lipid panel today as it will not change management.   Vitamin D deficiency Currently on 1000 units daily.  Check Vitamin D   Right flank pain Patient has history of kidney stones and presents with right flank pain. She was seen on 2/14 in ED. POC UA nl. CT renal stone study did not show kidney stone , but it did show chronic mild prominence of the right collecting system mid proximal right ureter. Agree this  Patient continues to have pain. It is not exacerbated by movement.   Plan:  - BMP8+EGFR - Urinalysis, Routine w reflex microscopic - Will follow up with patient after studies return. If continuing to have pain and studies are unrevealing will refer to urology for further evaluation.    Hypertension Patient's BP today is 156/70 with a goal of <140/80. The patient endorses adherence to her  medication regimen. Reports BP normal most of the time.  She is in acute pain today from possible kidney stone.    Plan: Continue Losartan 100 mg daily Continue HCTZ 25 mg daily Continue Amlodipine 5 mg daily  Recommend monitoring BP at home and following up if it is staying elevated ( > 140/80) over the next 2 weeks.      Stacey Dy, MD

## 2023-02-25 ENCOUNTER — Telehealth: Payer: Self-pay | Admitting: Internal Medicine

## 2023-02-25 DIAGNOSIS — M546 Pain in thoracic spine: Secondary | ICD-10-CM

## 2023-02-25 DIAGNOSIS — R109 Unspecified abdominal pain: Secondary | ICD-10-CM | POA: Insufficient documentation

## 2023-02-25 DIAGNOSIS — E559 Vitamin D deficiency, unspecified: Secondary | ICD-10-CM | POA: Insufficient documentation

## 2023-02-25 LAB — URINALYSIS, ROUTINE W REFLEX MICROSCOPIC
Bilirubin, UA: NEGATIVE
Glucose, UA: NEGATIVE
Ketones, UA: NEGATIVE
Leukocytes,UA: NEGATIVE
Nitrite, UA: NEGATIVE
Protein,UA: NEGATIVE
RBC, UA: NEGATIVE
Specific Gravity, UA: 1.014 (ref 1.005–1.030)
Urobilinogen, Ur: 0.2 mg/dL (ref 0.2–1.0)
pH, UA: 7 (ref 5.0–7.5)

## 2023-02-25 LAB — BMP8+EGFR
BUN/Creatinine Ratio: 16 (ref 12–28)
BUN: 13 mg/dL (ref 8–27)
CO2: 22 mmol/L (ref 20–29)
Calcium: 9.8 mg/dL (ref 8.7–10.3)
Chloride: 102 mmol/L (ref 96–106)
Creatinine, Ser: 0.79 mg/dL (ref 0.57–1.00)
Glucose: 124 mg/dL — ABNORMAL HIGH (ref 70–99)
Potassium: 4.6 mmol/L (ref 3.5–5.2)
Sodium: 142 mmol/L (ref 134–144)
eGFR: 77 mL/min/{1.73_m2} (ref >59)

## 2023-02-25 LAB — VITAMIN D 25 HYDROXY (VIT D DEFICIENCY, FRACTURES): Vit D, 25-Hydroxy: 42.1 ng/mL (ref 30.0–100.0)

## 2023-02-25 NOTE — Assessment & Plan Note (Signed)
Patient's BP today is 156/70 with a goal of <140/80. The patient endorses adherence to her medication regimen. Reports BP normal most of the time.  She is in acute pain today from possible kidney stone.    Plan: Continue Losartan 100 mg daily Continue HCTZ 25 mg daily Continue Amlodipine 5 mg daily  Recommend monitoring BP at home and following up if it is staying elevated ( > 140/80) over the next 2 weeks.

## 2023-02-25 NOTE — Telephone Encounter (Signed)
Patient continues to have constant pain in her back. Pain manageable with tylenol. Improved with heating pad last night. Reviewed normal urine study and BMP with patient. Will send for xray to rule out compression fracture. If unrevealing will refer patient to urology for further workup and evaluation of chronic mild prominence of the right collecting system mid proximal right ureter seen on CT Renal Stone Study.

## 2023-02-25 NOTE — Assessment & Plan Note (Signed)
Patient has history of kidney stones and presents with right flank pain. She was seen on 2/14 in ED. POC UA nl. CT renal stone study did not show kidney stone , but it did show chronic mild prominence of the right collecting system mid proximal right ureter. Agree this  Patient continues to have pain. It is not exacerbated by movement.   Plan:  - BMP8+EGFR - Urinalysis, Routine w reflex microscopic - Will follow up with patient after studies return. If continuing to have pain and studies are unrevealing will refer to urology for further evaluation.

## 2023-02-25 NOTE — Assessment & Plan Note (Signed)
Currently on 1000 units daily.  Check Vitamin D

## 2023-02-26 ENCOUNTER — Other Ambulatory Visit: Payer: Self-pay | Admitting: Internal Medicine

## 2023-02-26 ENCOUNTER — Ambulatory Visit (HOSPITAL_COMMUNITY)
Admission: RE | Admit: 2023-02-26 | Discharge: 2023-02-26 | Disposition: A | Discharge status: Home / Self Care | Payer: Medicare Other | Source: Ambulatory Visit | Attending: Internal Medicine | Admitting: Internal Medicine

## 2023-02-26 DIAGNOSIS — R109 Unspecified abdominal pain: Secondary | ICD-10-CM

## 2023-02-26 DIAGNOSIS — M546 Pain in thoracic spine: Secondary | ICD-10-CM | POA: Insufficient documentation

## 2023-03-19 ENCOUNTER — Ambulatory Visit (INDEPENDENT_AMBULATORY_CARE_PROVIDER_SITE_OTHER): Payer: Medicare Other | Admitting: Urology

## 2023-03-19 ENCOUNTER — Encounter: Payer: Self-pay | Admitting: Urology

## 2023-03-19 VITALS — BP 147/68 | HR 68 | Ht 63.0 in | Wt 153.0 lb

## 2023-03-19 DIAGNOSIS — R109 Unspecified abdominal pain: Secondary | ICD-10-CM | POA: Diagnosis not present

## 2023-03-19 DIAGNOSIS — N1339 Other hydronephrosis: Secondary | ICD-10-CM | POA: Diagnosis not present

## 2023-03-19 LAB — URINALYSIS, ROUTINE W REFLEX MICROSCOPIC
Bilirubin, UA: NEGATIVE
Glucose, UA: NEGATIVE
Ketones, UA: NEGATIVE
Leukocytes,UA: NEGATIVE
Nitrite, UA: NEGATIVE
Protein,UA: NEGATIVE
RBC, UA: NEGATIVE
Specific Gravity, UA: 1.01 (ref 1.005–1.030)
Urobilinogen, Ur: 0.2 mg/dL (ref 0.2–1.0)
pH, UA: 7 (ref 5.0–7.5)

## 2023-03-19 NOTE — Progress Notes (Signed)
Subjective: 1. Right flank pain   2. Other hydronephrosis      Consult requested by Dr. Lorette Ang is a 77 yo female who is sent for severe right flank pain and back pain.  She went to the ER on 02/04/23  and a CT showed no stones.  There was mild fullness of the right collecting system but no clear cause of obstruction was noted.  She has a history of stones but not in some time.  She had a CT in 2016 and had some fullness of the right collecting system then with some perinephric stranding.  The pain continues but it is intermittent without aggravating factors.  She had some nausea initially but none since.  She has had no hematuria or voiding complaints.  She hasn't had UTI's.  Cr was normal on 3/5 at 0.79.  Her UA is clear.  ROS:  Review of Systems  Endo/Heme/Allergies:  Bruises/bleeds easily.       Night sweats.     Allergies  Allergen Reactions   Tetanus Toxoids Swelling    Past Medical History:  Diagnosis Date   Carotid artery stenosis    Colon polyps    Diverticulosis    Hyperlipidemia    Hypertension    Kidney stones    Osteopenia    T-2.3   Right renal artery stenosis [facility])     Past Surgical History:  Procedure Laterality Date   ABDOMINAL HYSTERECTOMY  12/22/1980   Partial hysterectomy   COLONOSCOPY N/A 07/30/2016   Procedure: COLONOSCOPY;  Surgeon: Daneil Dolin, MD;  [facility];  Service: Endoscopy;  Laterality: N/A;  8:30 AM   FLEXIBLE SIGMOIDOSCOPY  11/28/2021   Procedure: FLEXIBLE SIGMOIDOSCOPY;  Surgeon: [name], DO;  [facility];  Service: Endoscopy;;   LEFT HEART CATH AND CORONARY ANGIOGRAPHY N/A 01/09/2020   Procedure: LEFT HEART CATH AND CORONARY ANGIOGRAPHY;  Surgeon: Nelva Bush, MD;  Location: [place] [facility];  Service: Cardiovascular;  Laterality: N/A;   POLYPECTOMY  07/30/2016   Procedure: POLYPECTOMY;  Surgeon: Daneil Dolin, MD;  [facility];  Service: Endoscopy;;  descending colon    SMALL INTESTINE SURGERY      Social History   Socioeconomic History   Marital status: Widowed    Spouse name: Not on file   Number of children: 2   Years of education: Not on file   Highest education level: Not on file  Occupational History   Occupation: part time-- [employer]  Tobacco Use   Smoking status: Former    Types: Cigarettes    Quit date: 12/22/1996    Years since quitting: 26.2   Smokeless tobacco: Never  Vaping Use   Vaping Use: Never used  Substance and Sexual Activity   Alcohol use: No   Drug use: No   Sexual activity: Not Currently  Other Topics Concern   Not on file  Social History Narrative   Not on file   Social Determinants of Health   Financial Resource Strain: Not on file  Food Insecurity: Not on file  Transportation Needs: Not on file  Physical Activity: Not on file  Stress: Not on file  Social Connections: Not on file  Intimate Partner Violence: Not on file    Family History  Problem Relation Age of Onset   Cancer Mother        pancratic   Heart attack Mother    Cancer Father  stomach   Diabetes Father    Hyperlipidemia Father    Heart disease Father        NOT  before age 38   Cancer Sister        lung   Hyperlipidemia Sister    Cancer Brother        thyroid, prostate   Hyperlipidemia Brother    Heart attack Brother    Cancer Sister        colon   Hyperlipidemia Sister    Cancer Sister        colon   Hyperlipidemia Sister     Anti-infectives: Anti-infectives (From admission, onward)    None       Current Outpatient Medications  Medication Sig Dispense Refill   amLODipine (NORVASC) 5 MG tablet Take 5 mg by mouth daily.     aspirin EC 81 MG tablet Take 81 mg by mouth daily.     cetirizine (ZYRTEC) 5 MG tablet Take 1 tablet (5 mg total) by mouth daily. (Patient taking differently: Take 5 mg by mouth daily. As needed) 30 tablet 0   cholecalciferol (VITAMIN D) 1000 UNITS tablet Take 1,000 Units by mouth  daily.     fluticasone (FLONASE) 50 MCG/ACT nasal spray Place 2 sprays into both nostrils daily. (Patient taking differently: Place 2 sprays into both nostrils as needed.) 16 g 6   hydrochlorothiazide (HYDRODIURIL) 25 MG tablet TAKE 1 TABLET BY MOUTH EVERY DAY 90 tablet 2   losartan (COZAAR) 100 MG tablet TAKE 1 TABLET(100 MG) BY MOUTH DAILY 90 tablet 2   Current Facility-Administered Medications  Medication Dose Route Frequency Provider Last Rate Last Admin   0.9 %  sodium chloride infusion  500 mL Intravenous Once Sharyn Creamer, MD         Objective: Vital signs in last 24 hours: BP (!) 147/68   Pulse 68   Ht 5\' 3"  (1.6 m)   Wt 153 lb (69.4 kg)   BMI 27.10 kg/m   Intake/Output from previous day: No intake/output data recorded. Intake/Output this shift: @IOTHISSHIFT @   Physical Exam Vitals reviewed.  Constitutional:      Appearance: Normal appearance.  Cardiovascular:     Rate and Rhythm: Normal rate and regular rhythm.     Heart sounds: Normal heart sounds.  Pulmonary:     Effort: Pulmonary effort is normal. No respiratory distress.     Breath sounds: Normal breath sounds.  Abdominal:     General: Abdomen is flat.     Palpations: Abdomen is soft.     Tenderness: There is abdominal tenderness (mild RUQ). There is right CVA tenderness (mild over the mid right paraspinous muscles).  Musculoskeletal:        General: No swelling or tenderness. Normal range of motion.     Cervical back: Normal range of motion and neck supple.  Skin:    General: Skin is warm and dry.  Neurological:     General: No focal deficit present.     Mental Status: She is alert and oriented to person, place, and time.  Psychiatric:        Mood and Affect: Mood normal.        Behavior: Behavior normal.     Lab Results:  No results found for this or any previous visit (from the past 24 hour(s)).  BMET No results for input(s): "NA", "K", "CL", "CO2", "GLUCOSE", "BUN", "CREATININE", "CALCIUM"  in the last 72 hours. PT/INR No results for input(s): "LABPROT", "INR"  in the last 72 hours. ABG No results for input(s): "PHART", "HCO3" in the last 72 hours.  Invalid input(s): "PCO2", "PO2" UA is clear Studies/Results: No results found. Records from PCP reviewed.  Prior imaging in EPIC reviewed.   Assessment/Plan: Right flank and abdominal pain with possible mild right hydronephrosis.   I am going to get a CT [facility] with contrast to assess the kidney further but I think her pain is probably musculoskeletal and will send her for a PT assessment.   No orders of the defined types were placed in this encounter.    Orders Placed This Encounter  Procedures   CT ABDOMEN PELVIS W CONTRAST    Standing Status:   Future    Standing Expiration Date:   07/19/2023    Order Specific Question:   If indicated for the ordered procedure, I authorize the administration of contrast media per Radiology protocol    Answer:   Yes    Order Specific Question:   Preferred imaging location?    Answer:   [facility]    Order Specific Question:   Is Oral Contrast requested for this exam?    Answer:   No oral contrast    Order Specific Question:   Reason for No Oral Contrast    Answer:   Other    Order Specific Question:   Please answer why no oral contrast is requested    Answer:   not indicated    Order Specific Question:   Radiology Contrast Protocol - do NOT remove file path    Answer:   [email or web address]   Urinalysis, Routine w reflex microscopic   Ambulatory referral to Physical Therapy    Referral Priority:   Routine    Referral Type:   Physical Medicine    Referral Reason:   Specialty Services Required    Requested Specialty:   Physical Therapy    Number of Visits Requested:   1     Return for Next available with CT results. .    CC: Dr. Madalyn Rob     Irine Seal 03/20/2023

## 2023-04-03 ENCOUNTER — Ambulatory Visit: Payer: Medicare Other

## 2023-04-09 NOTE — Therapy (Signed)
OUTPATIENT PHYSICAL THERAPY THORACOLUMBAR EVALUATION   Patient Name: Stacey Moody MRN: [id number] DOB:1946/08/25, 77 y.o., female Today's Date: 04/10/2023  END OF SESSION:  PT End of Session - 04/10/23 0752     Visit Number 1    Number of Visits 4    Date for PT Re-Evaluation 05/08/23    Authorization Type Medicare Part A and [employer]    Progress Note Due on Visit 10    PT Start Time 0755    PT Stop Time 0840    PT Time Calculation (min) 45 min    Activity Tolerance Patient tolerated treatment well    Behavior During Therapy [facility] for tasks assessed/performed             Past Medical History:  Diagnosis Date   Carotid artery stenosis    Colon polyps    Diverticulosis    Hyperlipidemia    Hypertension    Kidney stones    Osteopenia    T-2.3   Right renal artery stenosis [facility])    Past Surgical History:  Procedure Laterality Date   ABDOMINAL HYSTERECTOMY  12/22/1980   Partial hysterectomy   COLONOSCOPY N/A 07/30/2016   Procedure: COLONOSCOPY;  Surgeon: Corbin Ade, MD;  [facility];  Service: Endoscopy;  Laterality: N/A;  8:30 AM   FLEXIBLE SIGMOIDOSCOPY  11/28/2021   Procedure: FLEXIBLE SIGMOIDOSCOPY;  Surgeon: Lanelle Bal, DO;  [facility];  Service: Endoscopy;;   LEFT HEART CATH AND CORONARY ANGIOGRAPHY N/A 01/09/2020   Procedure: LEFT HEART CATH AND CORONARY ANGIOGRAPHY;  Surgeon: Yvonne Kendall, MD;  [facility];  Service: Cardiovascular;  Laterality: N/A;   POLYPECTOMY  07/30/2016   Procedure: POLYPECTOMY;  Surgeon: Corbin Ade, MD;  [facility];  Service: Endoscopy;;  descending colon   SMALL INTESTINE SURGERY     Patient Active Problem List   Diagnosis Date Noted   Vitamin D deficiency 02/25/2023   Right flank pain 02/25/2023   Right renal artery stenosis    Osteopenia    Carotid artery stenosis 01/03/2014   Hypertension    Hyperlipidemia    Occlusion and stenosis of carotid artery without  mention of cerebral infarction 06/22/2012    PCP: Delmar Landau  REFERRING PROVIDER: Bjorn Pippin, [facility] [place]  REFERRING DIAG: R10.9 (ICD-10-CM) - Right flank pain  Rationale for Evaluation and Treatment: Rehabilitation  THERAPY DIAG:  Flank pain  Chronic thoracic back pain, unspecified back pain laterality  ONSET DATE: Feb 2024  SUBJECTIVE:  SUBJECTIVE STATEMENT: Pain started in Feb; went to ER Feb 14th secondary to pain; gave shots for pain and that helped temporarily; went to Dr. Annabell Howells to rule out kidney issues.  Referred to therapy and wants to rule out musculoskeletal origin and will then do further testing if needed. Working at a [employer] office 4 days a week  PERTINENT HISTORY:  osteopenia  PAIN:  Are you having pain? Yes: NPRS scale: 7/10 Pain location: midback and right side Pain description: aching all the time Aggravating factors: unknown Relieving factors: Tylenol, heat  PRECAUTIONS: None  WEIGHT BEARING RESTRICTIONS: No  FALLS:  Has patient fallen in last 6 months? 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  Assess for fracture.   EXAM: THORACIC SPINE 2 VIEWS   COMPARISON:  None Available.   FINDINGS: There is no evidence of acute thoracic spine fracture or dislocation. Mild curvature of spine. Mild narrow intervertebral space and  anterior spurring identified throughout thoracic spine.   IMPRESSION: No acute fracture or dislocation. Mild degenerative joint changes of thoracic spine.      PATIENT SURVEYS:  Modified Oswestry 21/50 42%  COGNITION: Overall cognitive status: Within functional limits for tasks assessed     SENSATION: WFL   POSTURE: rounded shoulders, forward head, and increased thoracic kyphosis  PALPATION: Tender thoracic spine  CERVICAL ROM: wfl throughout grossly  Active ROM AROM (deg) eval  Flexion   Extension   Right lateral flexion   Left lateral flexion   Right rotation   Left rotation    (Blank rows = not tested)  UPPER EXTREMITY ROM:  Active ROM Right eval Left eval  Shoulder flexion 128 128  Shoulder extension    Shoulder abduction    Shoulder adduction    Shoulder extension    Shoulder internal rotation    Shoulder external rotation    Elbow flexion    Elbow extension    Wrist flexion    Wrist extension    Wrist ulnar deviation    Wrist radial deviation    Wrist pronation    Wrist supination     (Blank rows = not tested)  UPPER EXTREMITY MMT:  MMT Right eval Left eval  Shoulder flexion 4 4  Shoulder extension    Shoulder abduction    Shoulder adduction    Shoulder extension    Shoulder internal rotation    Shoulder external rotation    Middle trapezius    Lower trapezius    Elbow flexion    Elbow extension    Wrist flexion    Wrist extension    Wrist ulnar deviation    Wrist radial deviation    Wrist pronation    Wrist supination    Grip strength     (Blank rows = not tested)  LUMBAR ROM:   AROM eval  Flexion 40% available painful  Extension 60% available   Right lateral flexion   Left lateral flexion   Right rotation   Left rotation    (Blank rows = not tested)   LOWER EXTREMITY MMT:  MMT Right eval Left eval  Hip flexion 4 4-  Hip extension    Hip abduction    Hip adduction    Hip internal rotation    Hip external  rotation    Knee flexion    Knee extension 5 5  Ankle dorsiflexion 5 5  Ankle plantarflexion    Ankle inversion    Ankle eversion     (Blank rows = not tested)   FUNCTIONAL TESTS:  5 times sit to stand: 27.60 sec using hands to assist up to standing   TODAY'S TREATMENT:                                                                                                                              DATE: 04/10/2023 physical therapy evaluation and HEP instruction    PATIENT EDUCATION:  Education details: Patient educated on exam findings, POC, scope of PT, HEP, and what to expect next visit. Person educated: Patient Education method: Explanation, Demonstration, and Handouts Education comprehension: verbalized understanding, returned demonstration, verbal cues required, and tactile cues required   HOME EXERCISE PROGRAM: 04/10/23 Decompression exercises 1-5 Access Code: [email or web address] URL: [email or web address] Date: 04/10/2023 Prepared by: [facility] - [facility]  Exercises - Sitting to Supine Roll  - 1 x daily - 7 x weekly - 3 sets - 10 reps  ASSESSMENT:  CLINICAL IMPRESSION: Patient is a 77 y.o. female who was seen today for physical therapy evaluation and treatment for R10.9 (ICD-10-CM) - Right flank pain. Patient is  presents to physical therapy with complaint of thoracic pain that radiates sometimes around right side of the trunk. Patient demonstrates muscle weakness, reduced ROM, and fascial restrictions which are likely contributing to symptoms of pain and are negatively impacting patient ability to perform ADLs and functional mobility tasks.Question possible compression fracture due to history of osteopenia however x-ray was negative for fracture.   Patient will benefit from skilled physical therapy services to address these deficits to reduce pain and improve level of function with ADLs and functional mobility tasks.   OBJECTIVE IMPAIRMENTS: decreased activity tolerance, decreased  knowledge of condition, decreased knowledge of use of DME, decreased mobility, decreased ROM, decreased strength, hypomobility, increased fascial restrictions, impaired perceived functional ability, impaired flexibility, postural dysfunction, and pain.   ACTIVITY LIMITATIONS: carrying, lifting, bending, sitting, standing, squatting, sleeping, stairs, reach over head, locomotion level, and caring for others  PARTICIPATION LIMITATIONS: meal prep, cleaning, laundry, shopping, community activity, occupation, and yard work  [facility] POTENTIAL: Good  CLINICAL DECISION MAKING: Stable/uncomplicated  EVALUATION COMPLEXITY: Low   GOALS: Goals reviewed with patient? No  SHORT TERM GOALS: Target date: 04/24/2023  patient will be independent with initial HEP  Baseline: Goal status: INITIAL  2.  Patient will self report 50% improvement to improve tolerance for functional activity  Baseline:  Goal status: INITIAL   LONG TERM GOALS: Target date: 05/08/2023  Patient will be independent in self management strategies to improve quality of life and functional  outcomes. Baseline:  Goal status: INITIAL  2.  Patient will self report 75% improvement to improve tolerance for functional activity  Baseline:  Goal status: INITIAL  3.  Patient will improve modified Oswestry score by 9 points (30/50) to demonstrate improved functional mobility Baseline: 21/50 Goal status: INITIAL  4.  Patient will improve 5 times sit to stand score from 27.6 sec to 20 sec without use of arms to assist to demonstrate improved functional mobility and increased lower extremity strength.  Baseline:  Goal status: INITIAL  5.  Patient will improve bilateral shoulder flexion to 150 degrees to improve ability to reach items in overhead cabinets at home.  Baseline: 128 Goal status: INITIAL  6.  Patient will increase all tested  MMTs to 4+ to 5/5 without pain to promote return to performing all ADL's with increased  efficiency and ambulation community distances with minimal deviation.  Baseline:  Goal status: INITIAL  PLAN:  PT FREQUENCY: 1-2x/week  PT DURATION: 4 weeks  PLANNED INTERVENTIONS: Therapeutic exercises, Therapeutic activity, Neuromuscular re-education, Balance training, Gait training, Patient/Family education, Joint manipulation, Joint mobilization, Stair training, Orthotic/Fit training, DME instructions, Aquatic Therapy, Dry Needling, Electrical stimulation, Spinal manipulation, Spinal mobilization, Cryotherapy, Moist heat, Compression bandaging, scar mobilization, Splintting, Taping, Traction, Ultrasound, Ionotophoresis 4mg /ml Dexamethasone, and Manual therapy .  PLAN FOR NEXT SESSION: Review HEP and goals; progress postural strengthening; avoid trunk flexion/ thoracic spine compression   8:48 AM, 04/10/23 Fedra Lanter Small Neldon Shepard MPT [facility] [place] [phone] Ph:[phone]

## 2023-04-10 ENCOUNTER — Other Ambulatory Visit: Payer: Self-pay

## 2023-04-10 ENCOUNTER — Ambulatory Visit (HOSPITAL_COMMUNITY): Payer: Medicare Other | Attending: Urology

## 2023-04-10 DIAGNOSIS — M546 Pain in thoracic spine: Secondary | ICD-10-CM | POA: Insufficient documentation

## 2023-04-10 DIAGNOSIS — G8929 Other chronic pain: Secondary | ICD-10-CM | POA: Insufficient documentation

## 2023-04-10 DIAGNOSIS — R109 Unspecified abdominal pain: Secondary | ICD-10-CM | POA: Insufficient documentation

## 2023-04-17 ENCOUNTER — Encounter (HOSPITAL_COMMUNITY): Payer: Self-pay

## 2023-04-17 ENCOUNTER — Ambulatory Visit (HOSPITAL_COMMUNITY): Payer: Medicare Other

## 2023-04-17 DIAGNOSIS — M546 Pain in thoracic spine: Secondary | ICD-10-CM | POA: Diagnosis not present

## 2023-04-17 DIAGNOSIS — R109 Unspecified abdominal pain: Secondary | ICD-10-CM | POA: Diagnosis not present

## 2023-04-17 DIAGNOSIS — G8929 Other chronic pain: Secondary | ICD-10-CM

## 2023-04-17 NOTE — Therapy (Signed)
OUTPATIENT PHYSICAL THERAPY THORACOLUMBAR TREATMENT   Patient Name: Stacey Moody MRN: [id number] DOB:[birth year], 77 y.o., female Today's Date: 04/17/2023  END OF SESSION:   04/17/23 0751  PT Visits / Re-Eval  Visit Number 2  Number of Visits 4  Date for PT Re-Evaluation 05/08/23  Authorization  Authorization Type Medicare Part A and AARP  Progress Note Due on Visit 10  PT Time Calculation  PT Start Time 0734  PT Stop Time 0812  PT Time Calculation (min) 38 min  PT - End of Session  Activity Tolerance Patient tolerated treatment well  Behavior During Therapy WFL for tasks assessed/performed      Past Medical History:  Diagnosis Date   Carotid artery stenosis    Colon polyps    Diverticulosis    Hyperlipidemia    Hypertension    Kidney stones    Osteopenia    T-2.3   Right renal artery stenosis [facility])    Past Surgical History:  Procedure Laterality Date   ABDOMINAL HYSTERECTOMY  12/22/1980   Partial hysterectomy   COLONOSCOPY N/A 07/30/2016   Procedure: COLONOSCOPY;  Surgeon: Corbin Ade, MD;  [facility];  Service: Endoscopy;  Laterality: N/A;  8:30 AM   FLEXIBLE SIGMOIDOSCOPY  11/28/2021   Procedure: FLEXIBLE SIGMOIDOSCOPY;  Surgeon: Lanelle Bal, DO;  [facility];  Service: Endoscopy;;   LEFT HEART CATH AND CORONARY ANGIOGRAPHY N/A 01/09/2020   Procedure: LEFT HEART CATH AND CORONARY ANGIOGRAPHY;  Surgeon: Yvonne Kendall, MD;  [facility];  Service: Cardiovascular;  Laterality: N/A;   POLYPECTOMY  07/30/2016   Procedure: POLYPECTOMY;  Surgeon: Corbin Ade, MD;  [facility];  Service: Endoscopy;;  descending colon   SMALL INTESTINE SURGERY     Patient Active Problem List   Diagnosis Date Noted   Vitamin D deficiency 02/25/2023   Right flank pain 02/25/2023   Right renal artery stenosis (HCC)    Osteopenia    Carotid artery stenosis 01/03/2014   Hypertension    Hyperlipidemia    Occlusion and  stenosis of carotid artery without mention of cerebral infarction 06/22/2012    PCP: Delmar Landau  REFERRING PROVIDER[facility] [place] Next apt: 04/30/23  REFERRING DIAG: R10.9 (ICD-10-CM) - Right flank pain  Rationale for Evaluation and Treatment: Rehabilitation  THERAPY DIAG:  No diagnosis found.  ONSET DATE: Feb 2024  SUBJECTIVE:  SUBJECTIVE STATEMENT:  04/17/23:  Pt stated she is feeling much better.  Has began the HEP without questions.  Current pain scale 4/10 achy pain.    Eval:  Pain started in Feb; went to ER Feb 14th secondary to pain; gave shots for pain and that helped temporarily; went to Dr. Annabell Howells to rule out kidney issues.  Referred to therapy and wants to rule out musculoskeletal origin and will then do further testing if needed. Working at a [employer] office 4 days a week  PERTINENT HISTORY:  osteopenia  PAIN:  Are you having pain? Yes: NPRS scale: 7/10 Pain location: midback and right side Pain description: aching all the time Aggravating factors: unknown Relieving factors: Tylenol, heat  PRECAUTIONS: None  WEIGHT BEARING RESTRICTIONS: No  FALLS:  Has patient fallen in last 6 months? 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  Assess for fracture.   EXAM: THORACIC SPINE 2 VIEWS   COMPARISON:  None Available.   FINDINGS: There is  no evidence of acute thoracic spine fracture or dislocation. Mild curvature of spine. Mild narrow intervertebral space and anterior spurring identified throughout thoracic spine.   IMPRESSION: No acute fracture or dislocation. Mild degenerative joint changes of thoracic spine.      PATIENT SURVEYS:  Modified Oswestry 21/50 42%  COGNITION: Overall cognitive status: Within functional limits for tasks assessed     SENSATION: WFL   POSTURE: rounded shoulders, forward head, and increased thoracic kyphosis  PALPATION: Tender thoracic spine  CERVICAL ROM: wfl throughout grossly  Active ROM AROM (deg) eval  Flexion   Extension   Right lateral flexion   Left lateral flexion   Right rotation   Left rotation    (Blank rows = not tested)  UPPER EXTREMITY ROM:  Active ROM Right eval Left eval Right 04/17/23 Left 04/17/23  Shoulder flexion 128 128 145 151  Shoulder extension      Shoulder abduction      Shoulder adduction      Shoulder extension      Shoulder internal rotation      Shoulder external rotation      Elbow flexion      Elbow extension      Wrist flexion      Wrist extension      Wrist ulnar deviation      Wrist radial deviation      Wrist pronation      Wrist supination       (Blank rows = not tested)  UPPER EXTREMITY MMT:  MMT Right eval Left eval  Shoulder flexion 4 4  Shoulder extension    Shoulder abduction    Shoulder adduction    Shoulder extension    Shoulder internal rotation    Shoulder external rotation    Middle trapezius    Lower trapezius    Elbow flexion    Elbow extension    Wrist flexion    Wrist extension    Wrist ulnar deviation    Wrist radial deviation    Wrist pronation    Wrist supination  Grip strength     (Blank rows = not tested)  LUMBAR ROM:   AROM eval  Flexion 40% available painful  Extension 60% available   Right lateral flexion   Left lateral flexion   Right rotation   Left rotation    (Blank  rows = not tested)   LOWER EXTREMITY MMT:    MMT Right eval Left eval  Hip flexion 4 4-  Hip extension    Hip abduction    Hip adduction    Hip internal rotation    Hip external rotation    Knee flexion    Knee extension 5 5  Ankle dorsiflexion 5 5  Ankle plantarflexion    Ankle inversion    Ankle eversion     (Blank rows = not tested)   FUNCTIONAL TESTS:  5 times sit to stand: 27.60 sec using hands to assist up to standing   TODAY'S TREATMENT:                                                                                                                              DATE:  04/17/23: Reviewed goals Educated importance of HEP compliance  Supine:  Log rolling Decompression 2-5 5 reps Decompression with RTB 10x 5" holds each  Seated: Rows  Standing: Wall slide Wall arch 10x 2 sets RTB rows and shoulder extension. 10x    04/10/2023 physical therapy evaluation and HEP instruction    PATIENT EDUCATION:  Education details: Patient educated on exam findings, POC, scope of PT, HEP, and what to expect next visit. Person educated: Patient Education method: Explanation, Demonstration, and Handouts Education comprehension: verbalized understanding, returned demonstration, verbal cues required, and tactile cues required   HOME EXERCISE PROGRAM: 04/17/23: Decompression with RTB; RTB rows and shoulder extension 04/10/23 Decompression exercises 1-5 Access Code: [email or web address] URL: [email or web address] Date: 04/10/2023 Prepared by: [facility] - [facility]  Exercises - Sitting to Supine Roll  - 1 x daily - 7 x weekly - 3 sets - 10 reps  ASSESSMENT:  CLINICAL IMPRESSION: 04/17/23:  Reviewed goals and educated importance of HEP compliance for maximal benefits with therapy.  Pt able to recall and demonstrate appropriate mechanics with current exercise program.  Session focus with postural strengthening and UE mobility.  Pt tolerated well to session with additional resistance  added to decompression and postural strengthening exercises, verbal and tactile cueing to improve scapular retraction and stability with new exercises.  Presents with vast improvements with shoulder mobility compared to last session.  Eval:  Patient is a 77 y.o. female who was seen today for physical therapy evaluation and treatment for R10.9 (ICD-10-CM) - Right flank pain. Patient is  presents to physical therapy with complaint of thoracic pain that radiates sometimes around right side of the trunk. Patient demonstrates muscle weakness, reduced ROM, and fascial restrictions which are likely contributing to symptoms of pain and are negatively impacting patient ability to perform ADLs and functional mobility tasks.Question possible compression fracture  due to history of osteopenia however x-ray was negative for fracture.   Patient will benefit from skilled physical therapy services to address these deficits to reduce pain and improve level of function with ADLs and functional mobility tasks.   OBJECTIVE IMPAIRMENTS: decreased activity tolerance, decreased knowledge of condition, decreased knowledge of use of DME, decreased mobility, decreased ROM, decreased strength, hypomobility, increased fascial restrictions, impaired perceived functional ability, impaired flexibility, postural dysfunction, and pain.   ACTIVITY LIMITATIONS: carrying, lifting, bending, sitting, standing, squatting, sleeping, stairs, reach over head, locomotion level, and caring for others  PARTICIPATION LIMITATIONS: meal prep, cleaning, laundry, shopping, community activity, occupation, and yard work  [facility] POTENTIAL: Good  CLINICAL DECISION MAKING: Stable/uncomplicated  EVALUATION COMPLEXITY: Low   GOALS: Goals reviewed with patient? No  SHORT TERM GOALS: Target date: 04/24/2023  patient will be independent with initial HEP  Baseline: Goal status: IN PROGRESS  2.  Patient will self report 50% improvement to improve  tolerance for functional activity  Baseline:  Goal status: IN PROGRESS   LONG TERM GOALS: Target date: 05/08/2023  Patient will be independent in self management strategies to improve quality of life and functional outcomes. Baseline:  Goal status: IN PROGRESS  2.  Patient will self report 75% improvement to improve tolerance for functional activity  Baseline:  Goal status: IN PROGRESS  3.  Patient will improve modified Oswestry score by 9 points (30/50) to demonstrate improved functional mobility Baseline: 21/50 Goal status: IN PROGRESS  4.  Patient will improve 5 times sit to stand score from 27.6 sec to 20 sec without use of arms to assist to demonstrate improved functional mobility and increased lower extremity strength.  Baseline:  Goal status: IN PROGRESS  5.  Patient will improve bilateral shoulder flexion to 150 degrees to improve ability to reach items in overhead cabinets at home.  Baseline: 128 Goal status: IN PROGRESS  6.  Patient will increase all tested  MMTs to 4+ to 5/5 without pain to promote return to performing all ADL's with increased efficiency and ambulation community distances with minimal deviation.  Baseline:  Goal status: IN PROGRESS  PLAN:  PT FREQUENCY: 1-2x/week  PT DURATION: 4 weeks  PLANNED INTERVENTIONS: Therapeutic exercises, Therapeutic activity, Neuromuscular re-education, Balance training, Gait training, Patient/Family education, Joint manipulation, Joint mobilization, Stair training, Orthotic/Fit training, DME instructions, Aquatic Therapy, Dry Needling, Electrical stimulation, Spinal manipulation, Spinal mobilization, Cryotherapy, Moist heat, Compression bandaging, scar mobilization, Splintting, Taping, Traction, Ultrasound, Ionotophoresis 4mg /ml Dexamethasone, and Manual therapy .  PLAN FOR NEXT SESSION:  progress postural strengthening; avoid trunk flexion/ thoracic spine compression  Becky Sax, LPTA/CLT;  CBIS [phone]  7:30 AM, 04/17/23

## 2023-04-23 ENCOUNTER — Encounter (HOSPITAL_COMMUNITY): Payer: Self-pay

## 2023-04-23 ENCOUNTER — Ambulatory Visit (HOSPITAL_COMMUNITY): Payer: Medicare Other | Attending: Urology

## 2023-04-23 DIAGNOSIS — M546 Pain in thoracic spine: Secondary | ICD-10-CM | POA: Insufficient documentation

## 2023-04-23 DIAGNOSIS — R109 Unspecified abdominal pain: Secondary | ICD-10-CM | POA: Insufficient documentation

## 2023-04-23 DIAGNOSIS — G8929 Other chronic pain: Secondary | ICD-10-CM

## 2023-04-23 NOTE — Therapy (Signed)
OUTPATIENT PHYSICAL THERAPY THORACOLUMBAR TREATMENT   Patient Name: Stacey Moody MRN: [id number] DOB:11/17/1946, 77 y.o., female Today's Date: 04/23/2023  END OF SESSION:   PT End of Session - 04/23/23 0808     Visit Number 3    Number of Visits 4    Date for PT Re-Evaluation 05/08/23    Authorization Type Medicare Part A and AARP    Progress Note Due on Visit 10    PT Start Time 0734    PT Stop Time 0812    PT Time Calculation (min) 38 min    Activity Tolerance Patient tolerated treatment well    Behavior During Therapy [facility] for tasks assessed/performed              Past Medical History:  Diagnosis Date   Carotid artery stenosis    Colon polyps    Diverticulosis    Hyperlipidemia    Hypertension    Kidney stones    Osteopenia    T-2.3   Right renal artery stenosis [facility])    Past Surgical History:  Procedure Laterality Date   ABDOMINAL HYSTERECTOMY  12/22/1980   Partial hysterectomy   COLONOSCOPY N/A 07/30/2016   Procedure: COLONOSCOPY;  Surgeon: Corbin Ade, MD;  [facility];  Service: Endoscopy;  Laterality: N/A;  8:30 AM   FLEXIBLE SIGMOIDOSCOPY  11/28/2021   Procedure: FLEXIBLE SIGMOIDOSCOPY;  Surgeon: Lanelle Bal, DO;  [facility];  Service: Endoscopy;;   LEFT HEART CATH AND CORONARY ANGIOGRAPHY N/A 01/09/2020   Procedure: LEFT HEART CATH AND CORONARY ANGIOGRAPHY;  Surgeon: Yvonne Kendall, MD;  [facility];  Service: Cardiovascular;  Laterality: N/A;   POLYPECTOMY  07/30/2016   Procedure: POLYPECTOMY;  Surgeon: Corbin Ade, MD;  [facility];  Service: Endoscopy;;  descending colon   SMALL INTESTINE SURGERY     Patient Active Problem List   Diagnosis Date Noted   Vitamin D deficiency 02/25/2023   Right flank pain 02/25/2023   Right renal artery stenosis (HCC)    Osteopenia    Carotid artery stenosis 01/03/2014   Hypertension    Hyperlipidemia    Occlusion and stenosis of carotid artery  without mention of cerebral infarction 06/22/2012    PCP: Patria Mane [facility] [place] Next apt: 04/30/23  REFERRING DIAG: R10.9 (ICD-10-CM) - Right flank pain  Rationale for Evaluation and Treatment: Rehabilitation  THERAPY DIAG:  Flank pain  Chronic thoracic back pain, unspecified back pain laterality  ONSET DATE: Feb 2024  SUBJECTIVE:  SUBJECTIVE STATEMENT:  04/23/23:  Pt reports increased pain all week until today, no reports of pain currently.  Reports she did yard work for 30-45 minutes Monday and Tuesday with increased LBP Rt side.   Eval:  Pain started in Feb; went to ER Feb 14th secondary to pain; gave shots for pain and that helped temporarily; went to Dr. Annabell Howells to rule out kidney issues.  Referred to therapy and wants to rule out musculoskeletal origin and will then do further testing if needed. Working at a [employer] office 4 days a week  PERTINENT HISTORY:  osteopenia  PAIN:  Are you having pain? Yes: NPRS scale: 7/10 Pain location: midback and right side Pain description: aching all the time Aggravating factors: unknown Relieving factors: Tylenol, heat  PRECAUTIONS: None  WEIGHT BEARING RESTRICTIONS: No  FALLS:  Has patient fallen in last 6 months? 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  Assess for fracture.    EXAM: THORACIC SPINE 2 VIEWS   COMPARISON:  None Available.   FINDINGS: There is no evidence of acute thoracic spine fracture or dislocation. Mild curvature of spine. Mild narrow intervertebral space and anterior spurring identified throughout thoracic spine.   IMPRESSION: No acute fracture or dislocation. Mild degenerative joint changes of thoracic spine.      PATIENT SURVEYS:  Modified Oswestry 21/50 42%  COGNITION: Overall cognitive status: Within functional limits for tasks assessed     SENSATION: WFL   POSTURE: rounded shoulders, forward head, and increased thoracic kyphosis  PALPATION: Tender thoracic spine  CERVICAL ROM: wfl throughout grossly  Active ROM AROM (deg) eval  Flexion   Extension   Right lateral flexion   Left lateral flexion   Right rotation   Left rotation    (Blank rows = not tested)  UPPER EXTREMITY ROM:  Active ROM Right eval Left eval Right 04/17/23 Left 04/17/23  Shoulder flexion 128 128 145 151  Shoulder extension      Shoulder abduction      Shoulder adduction      Shoulder extension      Shoulder internal rotation      Shoulder external rotation      Elbow flexion      Elbow extension      Wrist flexion      Wrist extension      Wrist ulnar deviation      Wrist radial deviation      Wrist pronation      Wrist supination       (Blank rows = not tested)  UPPER EXTREMITY MMT:  MMT Right eval Left eval  Shoulder flexion 4 4  Shoulder extension    Shoulder abduction    Shoulder adduction    Shoulder extension    Shoulder internal rotation    Shoulder external rotation    Middle trapezius    Lower trapezius    Elbow flexion    Elbow extension    Wrist flexion    Wrist extension    Wrist ulnar deviation    Wrist radial deviation  Wrist pronation    Wrist supination    Grip strength     (Blank rows = not tested)  LUMBAR ROM:   AROM eval  Flexion 40% available painful  Extension 60% available   Right  lateral flexion   Left lateral flexion   Right rotation   Left rotation    (Blank rows = not tested)   LOWER EXTREMITY MMT:    MMT Right eval Left eval  Hip flexion 4 4-  Hip extension    Hip abduction    Hip adduction    Hip internal rotation    Hip external rotation    Knee flexion    Knee extension 5 5  Ankle dorsiflexion 5 5  Ankle plantarflexion    Ankle inversion    Ankle eversion     (Blank rows = not tested)   FUNCTIONAL TESTS:  5 times sit to stand: 27.60 sec using hands to assist up to standing   TODAY'S TREATMENT:                                                                                                                              DATE:  04/23/23: Standing: RTB rows and shoulder extension 10x 2 sets Chin tucks against wall 10x 5" UE flexion paired with chin tuck 10 Wall arch 10x  Seated sit to stand with eccentric control 10x no HHA  Seated posture  Supine: bridges Deep breathing x 1 min Ab sets paired with exhale x 2 min Marching with ab set [facility] with towel 2x 30"  04/17/23: Reviewed goals Educated importance of HEP compliance  Supine:  Log rolling Decompression 2-5 5 reps Decompression with RTB 10x 5" holds each  Seated: Rows  Standing: Wall slide Wall arch 10x 2 sets RTB rows and shoulder extension. 10x    04/10/2023 physical therapy evaluation and HEP instruction    PATIENT EDUCATION:  Education details: Patient educated on exam findings, POC, scope of PT, HEP, and what to expect next visit. Person educated: Patient Education method: Explanation, Demonstration, and Handouts Education comprehension: verbalized understanding, returned demonstration, verbal cues required, and tactile cues required   HOME EXERCISE PROGRAM: 04/17/23: Decompression with RTB; RTB rows and shoulder extension 04/10/23 Decompression exercises 1-5 Access Code: [email or web address] URL: [email or web address] Date: 04/10/2023 Prepared by: [facility] -  Rehab  Exercises - Sitting to Supine Roll  - 1 x daily - 7 x weekly - 3 sets - 10 reps  ASSESSMENT:  CLINICAL IMPRESSION: 04/23/23:  Progress postural and core/proximal strengthening with additional exercises.  Pt able to demonstrate good mechanics following initial demonstration and cueing for form.  No reports of pain through session.  Added core and gluteal strengthening exercises to HEP with printout given and verbalized understanding.    Eval:  Patient is a 77 y.o. female who was seen today for physical therapy evaluation and treatment for R10.9 (ICD-10-CM) - Right flank pain. Patient is  presents to physical therapy with  complaint of thoracic pain that radiates sometimes around right side of the trunk. Patient demonstrates muscle weakness, reduced ROM, and fascial restrictions which are likely contributing to symptoms of pain and are negatively impacting patient ability to perform ADLs and functional mobility tasks.Question possible compression fracture due to history of osteopenia however x-ray was negative for fracture.   Patient will benefit from skilled physical therapy services to address these deficits to reduce pain and improve level of function with ADLs and functional mobility tasks.   OBJECTIVE IMPAIRMENTS: decreased activity tolerance, decreased knowledge of condition, decreased knowledge of use of DME, decreased mobility, decreased ROM, decreased strength, hypomobility, increased fascial restrictions, impaired perceived functional ability, impaired flexibility, postural dysfunction, and pain.   ACTIVITY LIMITATIONS: carrying, lifting, bending, sitting, standing, squatting, sleeping, stairs, reach over head, locomotion level, and caring for others  PARTICIPATION LIMITATIONS: meal prep, cleaning, laundry, shopping, community activity, occupation, and yard work  [facility] POTENTIAL: Good  CLINICAL DECISION MAKING: Stable/uncomplicated  EVALUATION COMPLEXITY: Low   GOALS: Goals  reviewed with patient? No  SHORT TERM GOALS: Target date: 04/24/2023  patient will be independent with initial HEP  Baseline: Goal status: IN PROGRESS  2.  Patient will self report 50% improvement to improve tolerance for functional activity  Baseline:  Goal status: IN PROGRESS   LONG TERM GOALS: Target date: 05/08/2023  Patient will be independent in self management strategies to improve quality of life and functional outcomes. Baseline:  Goal status: IN PROGRESS  2.  Patient will self report 75% improvement to improve tolerance for functional activity  Baseline:  Goal status: IN PROGRESS  3.  Patient will improve modified Oswestry score by 9 points (30/50) to demonstrate improved functional mobility Baseline: 21/50 Goal status: IN PROGRESS  4.  Patient will improve 5 times sit to stand score from 27.6 sec to 20 sec without use of arms to assist to demonstrate improved functional mobility and increased lower extremity strength.  Baseline:  Goal status: IN PROGRESS  5.  Patient will improve bilateral shoulder flexion to 150 degrees to improve ability to reach items in overhead cabinets at home.  Baseline: 128 Goal status: IN PROGRESS  6.  Patient will increase all tested  MMTs to 4+ to 5/5 without pain to promote return to performing all ADL's with increased efficiency and ambulation community distances with minimal deviation.  Baseline:  Goal status: IN PROGRESS  PLAN:  PT FREQUENCY: 1-2x/week  PT DURATION: 4 weeks  PLANNED INTERVENTIONS: Therapeutic exercises, Therapeutic activity, Neuromuscular re-education, Balance training, Gait training, Patient/Family education, Joint manipulation, Joint mobilization, Stair training, Orthotic/Fit training, DME instructions, Aquatic Therapy, Dry Needling, Electrical stimulation, Spinal manipulation, Spinal mobilization, Cryotherapy, Moist heat, Compression bandaging, scar mobilization, Splintting, Taping, Traction, Ultrasound,  Ionotophoresis 4mg /ml Dexamethasone, and Manual therapy .  PLAN FOR NEXT SESSION:  progress postural strengthening; avoid trunk flexion/ thoracic spine compression.  Review goals next session.  Becky Sax, LPTA/CLT; CBIS [phone]  8:38 AM, 04/23/23

## 2023-04-30 ENCOUNTER — Ambulatory Visit: Payer: Medicare Other | Admitting: Urology

## 2023-04-30 ENCOUNTER — Ambulatory Visit (HOSPITAL_COMMUNITY): Payer: Medicare Other

## 2023-04-30 DIAGNOSIS — M546 Pain in thoracic spine: Secondary | ICD-10-CM | POA: Diagnosis not present

## 2023-04-30 DIAGNOSIS — R109 Unspecified abdominal pain: Secondary | ICD-10-CM

## 2023-04-30 DIAGNOSIS — G8929 Other chronic pain: Secondary | ICD-10-CM

## 2023-04-30 NOTE — Therapy (Signed)
OUTPATIENT PHYSICAL THERAPY THORACOLUMBAR TREATMENT/DISCHARGE NOTE PHYSICAL THERAPY DISCHARGE SUMMARY  Visits from Start of Care: 4  Current functional level related to goals / functional outcomes: See below   Remaining deficits: See below   Education / Equipment: See below   Patient agrees to discharge. Patient goals were met. Patient is being discharged due to meeting the stated rehab goals.    Patient Name: Stacey Moody MRN: [id number] DOB:January 21, 1946, 77 y.o.,, female Today's Date: 04/30/2023  END OF SESSION:   PT End of Session - 04/30/23 0728     Visit Number 4    Number of Visits 4    Date for PT Re-Evaluation 05/08/23    Authorization Type Medicare Part A and [employer]    Progress Note Due on Visit 10    PT Start Time 0728    PT Stop Time 0804    PT Time Calculation (min) 36 min    Activity Tolerance Patient tolerated treatment well    Behavior During Therapy [facility] for tasks assessed/performed              Past Medical History:  Diagnosis Date   Carotid artery stenosis    Colon polyps    Diverticulosis    Hyperlipidemia    Hypertension    Kidney stones    Osteopenia    T-2.3   Right renal artery stenosis [facility])    Past Surgical History:  Procedure Laterality Date   ABDOMINAL HYSTERECTOMY  12/22/1980   Partial hysterectomy   COLONOSCOPY N/A 07/30/2016   Procedure: COLONOSCOPY;  Surgeon: Corbin Ade, MD;  [facility];  Service: Endoscopy;  Laterality: N/A;  8:30 AM   FLEXIBLE SIGMOIDOSCOPY  11/28/2021   Procedure: FLEXIBLE SIGMOIDOSCOPY;  Surgeon: Lanelle Bal, DO;  Location: [facility];  Service: Endoscopy;;   LEFT HEART CATH AND CORONARY ANGIOGRAPHY N/A 01/09/2020   Procedure: LEFT HEART CATH AND CORONARY ANGIOGRAPHY;  Surgeon: Yvonne Kendall, MD;  Location: [facility];  Service: Cardiovascular;  Laterality: N/A;   POLYPECTOMY  07/30/2016   Procedure: POLYPECTOMY;  Surgeon: Corbin Ade, MD;  Location[facility];   Service: Endoscopy;;  descending colon   SMALL INTESTINE SURGERY     Patient Active Problem List   Diagnosis Date Noted   Vitamin D deficiency 02/25/2023   Right flank pain 02/25/2023   Right renal artery stenosis (HCC)    Osteopenia    Carotid artery stenosis 01/03/2014   Hypertension    Hyperlipidemia    Occlusion and stenosis of carotid artery without mention of cerebral infarction 06/22/2012    PCP: Patria Mane PROVID[facility] [place] Next apt: 04/30/23  REFERRING DIAG: R10.9 (ICD-10-CM) - Right flank pain  Rationale for Evaluation and Treatment: Rehabilitation  THERAPY DIAG:  Flank pain  Chronic thoracic back pain, unspecified back pain laterality  ONSET DATE: Feb 2024  SUBJECTIVE:  SUBJECTIVE STATEMENT:  Overall "much improved"; 78 to 80% better; think I overdid it a little yesterday so I'm a little sore. 4/10 pain this morning but prior to that has not had much pain   Eval:  Pain started in Feb; went to ER Feb 14th secondary to pain; gave shots for pain and that helped temporarily; went to Dr. Annabell Howells to rule out kidney issues.  Referred to therapy and wants to rule out musculoskeletal origin and will then do further testing if needed. Working at a [employer] office 4 days a week  PERTINENT HISTORY:  osteopenia  PAIN:  Are you having pain? Yes: NPRS scale: 7/10 Pain location: midback and right side Pain description: aching all the time Aggravating factors: unknown Relieving factors: Tylenol, heat  PRECAUTIONS: None  WEIGHT BEARING RESTRICTIONS: No  FALLS:  Has patient fallen in last 6 months? 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  Assess for fracture.   EXAM: THORACIC SPINE 2 VIEWS   COMPARISON:  None Available.   FINDINGS: There is no evidence of acute thoracic spine fracture or dislocation. Mild curvature of spine. Mild narrow intervertebral space and anterior spurring identified throughout thoracic spine.   IMPRESSION: No acute fracture or dislocation. Mild degenerative joint changes of thoracic spine.      PATIENT SURVEYS:  Modified Oswestry 21/50 42%  COGNITION: Overall cognitive status: Within functional limits for tasks assessed     SENSATION: WFL   POSTURE: rounded shoulders, forward head, and increased thoracic kyphosis  PALPATION: Tender thoracic spine  CERVICAL ROM: wfl throughout grossly  Active ROM AROM (deg) eval  Flexion   Extension   Right lateral flexion   Left lateral flexion   Right rotation   Left rotation    (Blank rows = not tested)  UPPER EXTREMITY ROM:  Active ROM Right eval Left eval Right 04/17/23 Left 04/17/23 Right 04/30/23 Left 04/30/23  Shoulder flexion 128 128 145 151 150 150  Shoulder extension        Shoulder abduction        Shoulder adduction        Shoulder extension        Shoulder internal rotation        Shoulder external rotation        Elbow flexion        Elbow extension        Wrist flexion        Wrist extension        Wrist ulnar deviation        Wrist radial deviation        Wrist pronation        Wrist supination         (Blank rows = not tested)  UPPER EXTREMITY MMT:  MMT Right eval Left eval Right 04/30/23 Left 04/30/23  Shoulder flexion 4 4 5 5   Shoulder extension      Shoulder abduction      Shoulder  adduction      Shoulder extension      Shoulder internal rotation  Shoulder external rotation      Middle trapezius      Lower trapezius      Elbow flexion      Elbow extension      Wrist flexion      Wrist extension      Wrist ulnar deviation      Wrist radial deviation      Wrist pronation      Wrist supination      Grip strength       (Blank rows = not tested)  LUMBAR ROM:   AROM eval  Flexion 40% available painful  Extension 60% available   Right lateral flexion   Left lateral flexion   Right rotation   Left rotation    (Blank rows = not tested)   LOWER EXTREMITY MMT:    MMT Right eval Left eval Right 04/30/23 Left 04/30/23  Hip flexion 4 4- 5 5  Hip extension      Hip abduction      Hip adduction      Hip internal rotation      Hip external rotation      Knee flexion      Knee extension 5 5 5 5   Ankle dorsiflexion 5 5 5 5   Ankle plantarflexion      Ankle inversion      Ankle eversion       (Blank rows = not tested)   FUNCTIONAL TESTS:  5 times sit to stand: 27.60 sec using hands to assist up to standing   TODAY'S TREATMENT:                                                                                                                              DATE:  04/30/23 Progress note Modified Oswestry 0/50; 0% disability 5 times sit to stand 10.5 sec no use of hands AROM and MMT's see above  Standing: GTB shoulder extensions and rows 2 x 10 Standing wall arches with UE flexion x 10  Review of HEP  04/23/23: Standing: RTB rows and shoulder extension 10x 2 sets Chin tucks against wall 10x 5" UE flexion paired with chin tuck 10 Wall arch 10x  Seated sit to stand with eccentric control 10x no HHA  Seated posture  Supine: bridges Deep breathing x 1 min Ab sets paired with exhale x 2 min Marching with ab set [facility] with towel 2x 30"  04/17/23: Reviewed goals Educated importance of HEP compliance  Supine:  Log rolling Decompression 2-5 5  reps Decompression with RTB 10x 5" holds each  Seated: Rows  Standing: Wall slide Wall arch 10x 2 sets RTB rows and shoulder extension. 10x    04/10/2023 physical therapy evaluation and HEP instruction    PATIENT EDUCATION:  Education details: Patient educated on exam findings, POC, scope of PT, HEP, and what to expect next visit. Person educated: Patient Education method: Explanation, Demonstration, and Handouts Education comprehension: verbalized understanding, returned demonstration,  verbal cues required, and tactile cues required   HOME EXERCISE PROGRAM: 04/17/23: Decompression with RTB; RTB rows and shoulder extension 04/10/23 Decompression exercises 1-5 Access Code: [email or web address] URL: [email or web address] Date: 04/10/2023 Prepared by: [facility] - Rehab  Exercises - Sitting to Supine Roll  - 1 x daily - 7 x weekly - 3 sets - 10 reps  ASSESSMENT:  CLINICAL IMPRESSION: Progress note today; all set rehab goals met; reviewed HEP; issued patient green theraband for home use.  She is agreeable to discharge at this time.   Eval:  Patient is a 77 y.o. female who was seen today for physical therapy evaluation and treatment for R10.9 (ICD-10-CM) - Right flank pain. Patient is  presents to physical therapy with complaint of thoracic pain that radiates sometimes around right side of the trunk. Patient demonstrates muscle weakness, reduced ROM, and fascial restrictions which are likely contributing to symptoms of pain and are negatively impacting patient ability to perform ADLs and functional mobility tasks.Question possible compression fracture due to history of osteopenia however x-ray was negative for fracture.   Patient will benefit from skilled physical therapy services to address these deficits to reduce pain and improve level of function with ADLs and functional mobility tasks.   OBJECTIVE IMPAIRMENTS: decreased activity tolerance, decreased knowledge of condition, decreased  knowledge of use of DME, decreased mobility, decreased ROM, decreased strength, hypomobility, increased fascial restrictions, impaired perceived functional ability, impaired flexibility, postural dysfunction, and pain.   ACTIVITY LIMITATIONS: carrying, lifting, bending, sitting, standing, squatting, sleeping, stairs, reach over head, locomotion level, and caring for others  PARTICIPATION LIMITATIONS: meal prep, cleaning, laundry, shopping, community activity, occupation, and yard work  [facility] POTENTIAL: Good  CLINICAL DECISION MAKING: Stable/uncomplicated  EVALUATION COMPLEXITY: Low   GOALS: Goals reviewed with patient? No  SHORT TERM GOALS: Target date: 04/24/2023  patient will be independent with initial HEP  Baseline: Goal status: MET  2.  Patient will self report 50% improvement to improve tolerance for functional activity  Baseline:  Goal status: MET   LONG TERM GOALS: Target date: 05/08/2023  Patient will be independent in self management strategies to improve quality of life and functional outcomes. Baseline:  Goal status: MET  2.  Patient will self report 75% improvement to improve tolerance for functional activity  Baseline:  Goal status: MET  3.  Patient will improve modified Oswestry score by 9 points (30/50) to demonstrate improved functional mobility Baseline: 21/50 Goal status: MET  4.  Patient will improve 5 times sit to stand score from 27.6 sec to 20 sec without use of arms to assist to demonstrate improved functional mobility and increased lower extremity strength.  Baseline:  Goal status: MET  5.  Patient will improve bilateral shoulder flexion to 150 degrees to improve ability to reach items in overhead cabinets at home.  Baseline: 128 Goal status: MET  6.  Patient will increase all tested  MMTs to 4+ to 5/5 without pain to promote return to performing all ADL's with increased efficiency and ambulation community distances with minimal  deviation.  Baseline:  Goal status: MET  PLAN:  PT FREQUENCY: 1-2x/week  PT DURATION: 4 weeks  PLANNED INTERVENTIONS: Therapeutic exercises, Therapeutic activity, Neuromuscular re-education, Balance training, Gait training, Patient/Family education, Joint manipulation, Joint mobilization, Stair training, Orthotic/Fit training, DME instructions, Aquatic Therapy, Dry Needling, Electrical stimulation, Spinal manipulation, Spinal mobilization, Cryotherapy, Moist heat, Compression bandaging, scar mobilization, Splintting, Taping, Traction, Ultrasound, Ionotophoresis 4mg /ml Dexamethasone, and Manual therapy .  PLAN FOR NEXT SESSION:  discharge 8:05  AM, 04/30/23 Blandon Offerdahl Small Derrian Poli MPT  physical therapy [place] [phone]

## 2023-05-07 ENCOUNTER — Encounter (HOSPITAL_COMMUNITY): Payer: Medicare Other

## 2023-05-15 ENCOUNTER — Ambulatory Visit (HOSPITAL_COMMUNITY): Payer: Medicare Other

## 2023-05-29 ENCOUNTER — Ambulatory Visit (INDEPENDENT_AMBULATORY_CARE_PROVIDER_SITE_OTHER): Payer: Medicare Other | Admitting: Internal Medicine

## 2023-05-29 ENCOUNTER — Encounter: Payer: Self-pay | Admitting: Internal Medicine

## 2023-05-29 VITALS — BP 121/64 | HR 67 | Ht 63.0 in | Wt 155.0 lb

## 2023-05-29 DIAGNOSIS — R7303 Prediabetes: Secondary | ICD-10-CM

## 2023-05-29 DIAGNOSIS — Z789 Other specified health status: Secondary | ICD-10-CM | POA: Diagnosis not present

## 2023-05-29 DIAGNOSIS — Z1329 Encounter for screening for other suspected endocrine disorder: Secondary | ICD-10-CM | POA: Diagnosis not present

## 2023-05-29 DIAGNOSIS — E785 Hyperlipidemia, unspecified: Secondary | ICD-10-CM | POA: Diagnosis not present

## 2023-05-29 DIAGNOSIS — I10 Essential (primary) hypertension: Secondary | ICD-10-CM

## 2023-05-29 NOTE — Patient Instructions (Signed)
It was a pleasure to see you today.  Thank you for giving us the opportunity to be involved in your care.  Below is a brief recap of your visit and next steps.  We will plan to see you again in 6 months.  Summary No medication changes today We will check labs  Follow up in 6 months  

## 2023-05-29 NOTE — Progress Notes (Unsigned)
Established Patient Office Visit  Subjective   Patient ID: Stacey Moody, female    DOB: [birth year]  Age: 77 y.o. MRN: [id number]  Chief Complaint  Patient presents with   Hypertension    Follow up   Ms. Dejoie returns to care today for routine follow-up.  She was last evaluated on 3/5 by Dr. Barbaraann Faster as a new patient presenting to establish care.  Her acute concern at that time was right flank pain.  CT renal stone protocol did not demonstrate a kidney stone but did show chronic mild prominence of the right collecting system and mid proximal right ureter.  Ultimately she was evaluated by urology (Dr. Annabell Howells).  Etiology of her pain was felt to be musculoskeletal in etiology.  She was referred to physical therapy.  There have otherwise been no acute interval events.  Past Medical History:  Diagnosis Date   Carotid artery stenosis    Colon polyps    Diverticulosis    Hyperlipidemia    Hypertension    Kidney stones    Osteopenia    T-2.3   Right renal artery stenosis [facility])    Past Surgical History:  Procedure Laterality Date   ABDOMINAL HYSTERECTOMY  12/22/1980   Partial hysterectomy   COLONOSCOPY N/A 07/30/2016   Procedure: COLONOSCOPY;  Surgeon: Corbin Ade, MD;  [facility];  Service: Endoscopy;  Laterality: N/A;  8:30 AM   FLEXIBLE SIGMOIDOSCOPY  11/28/2021   Procedure: FLEXIBLE SIGMOIDOSCOPY;  Surgeon: Lanelle Bal, DO;  [facility];  Service: Endoscopy;;   LEFT HEART CATH AND CORONARY ANGIOGRAPHY N/A 01/09/2020   Procedure: LEFT HEART CATH AND CORONARY ANGIOGRAPHY;  Surgeon: Yvonne Kendall, MD;  [facility]: [facility];  Service: Cardiovascular;  Laterality: N/A;   POLYPECTOMY  07/30/2016   Procedure: POLYPECTOMY;  Surgeon: Corbin Ade, MD;  Location[facility];  Service: Endoscopy;;  descending colon   SMALL INTESTINE SURGERY     Social History   Tobacco Use   Smoking status: Former    Types: Cigarettes    Quit date: 12/22/1996     Years since quitting: 26.4   Smokeless tobacco: Never  Vaping Use   Vaping Use: Never used  Substance Use Topics   Alcohol use: No   Drug use: No   Family History  Problem Relation Age of Onset   Cancer Mother        pancratic   Heart attack Mother    Cancer Father        stomach   Diabetes Father    Hyperlipidemia Father    Heart disease Father        NOT  before age 44   Cancer Sister        lung   Hyperlipidemia Sister    Cancer Brother        thyroid, prostate   Hyperlipidemia Brother    Heart attack Brother    Cancer Sister        colon   Hyperlipidemia Sister    Cancer Sister        colon   Hyperlipidemia Sister    Allergies  Allergen Reactions   Tetanus Toxoids Swelling   ROS    Objective:     BP (!) 146/64   Pulse 67   Ht 5\' 3"  (1.6 m)   Wt 155 lb (70.3 kg)   SpO2 97%   BMI 27.46 kg/m  BP Readings from Last 3 Encounters:  05/29/23 Marland Kitchen)  146/64  03/19/23 (!) 147/68  02/24/23 (!) 156/70   Physical Exam  Last CBC Lab Results  Component Value Date   WBC 7.1 02/04/2023   HGB 12.2 02/04/2023   HCT 34.8 (L) 02/04/2023   MCV 91.8 02/04/2023   MCH 32.2 02/04/2023   RDW 12.2 02/04/2023   PLT 237 02/04/2023   Last metabolic panel Lab Results  Component Value Date   GLUCOSE 124 (H) 02/24/2023   NA 142 02/24/2023   K 4.6 02/24/2023   CL 102 02/24/2023   CO2 22 02/24/2023   BUN 13 02/24/2023   CREATININE 0.79 02/24/2023   EGFR 77 02/24/2023   CALCIUM 9.8 02/24/2023   PROT 7.0 02/04/2023   ALBUMIN 4.3 02/04/2023   BILITOT 0.4 02/04/2023   ALKPHOS 77 02/04/2023   AST 25 02/04/2023   ALT 17 02/04/2023   ANIONGAP 11 02/04/2023   Last lipids Lab Results  Component Value Date   CHOL 131 01/07/2020   HDL 64 01/07/2020   LDLCALC 54 01/07/2020   TRIG 63 01/07/2020   CHOLHDL 2.0 01/07/2020   Last hemoglobin A1c Lab Results  Component Value Date   HGBA1C 6.3 (H) 01/07/2020   Last thyroid functions Lab Results  Component Value Date    TSH 1.91 06/06/2016   Last vitamin D Lab Results  Component Value Date   VD25OH 42.1 02/24/2023   Last vitamin B12 and Folate Lab Results  Component Value Date   VITAMINB12 412 01/02/2014     Assessment & Plan:   Problem List Items Addressed This Visit   None   No follow-ups on file.    Billie Lade, MD

## 2023-05-30 LAB — CMP14+EGFR
ALT: 19 IU/L (ref 0–32)
AST: 22 IU/L (ref 0–40)
Albumin/Globulin Ratio: 1.7 (ref 1.2–2.2)
Albumin: 4.4 g/dL (ref 3.8–4.8)
Alkaline Phosphatase: 86 IU/L (ref 44–121)
BUN/Creatinine Ratio: 18 (ref 12–28)
BUN: 14 mg/dL (ref 8–27)
Bilirubin Total: 0.3 mg/dL (ref 0.0–1.2)
CO2: 24 mmol/L (ref 20–29)
Calcium: 10 mg/dL (ref 8.7–10.3)
Chloride: 102 mmol/L (ref 96–106)
Creatinine, Ser: 0.78 mg/dL (ref 0.57–1.00)
Globulin, Total: 2.6 g/dL (ref 1.5–4.5)
Glucose: 112 mg/dL — ABNORMAL HIGH (ref 70–99)
Potassium: 4.5 mmol/L (ref 3.5–5.2)
Sodium: 140 mmol/L (ref 134–144)
Total Protein: 7 g/dL (ref 6.0–8.5)
eGFR: 78 mL/min/{1.73_m2} (ref >59)

## 2023-05-30 LAB — CBC WITH DIFFERENTIAL/PLATELET
Basophils Absolute: 0.1 10*3/uL (ref 0.0–0.2)
Basos: 1 %
EOS (ABSOLUTE): 0.1 10*3/uL (ref 0.0–0.4)
Eos: 2 %
Hematocrit: 38.9 % (ref 34.0–46.6)
Hemoglobin: 13.1 g/dL (ref 11.1–15.9)
Immature Grans (Abs): 0 10*3/uL (ref 0.0–0.1)
Immature Granulocytes: 0 %
Lymphocytes Absolute: 1.5 10*3/uL (ref 0.7–3.1)
Lymphs: 27 %
MCH: 31.9 pg (ref 26.6–33.0)
MCHC: 33.7 g/dL (ref 31.5–35.7)
MCV: 95 fL (ref 79–97)
Monocytes Absolute: 0.6 10*3/uL (ref 0.1–0.9)
Monocytes: 10 %
Neutrophils Absolute: 3.3 10*3/uL (ref 1.4–7.0)
Neutrophils: 60 %
Platelets: 266 10*3/uL (ref 150–450)
RBC: 4.11 x10E6/uL (ref 3.77–5.28)
RDW: 12.5 % (ref 11.7–15.4)
WBC: 5.6 10*3/uL (ref 3.4–10.8)

## 2023-05-30 LAB — HEMOGLOBIN A1C
Est. average glucose Bld gHb Est-mCnc: 131 mg/dL
Hgb A1c MFr Bld: 6.2 % — ABNORMAL HIGH (ref 4.8–5.6)

## 2023-05-30 LAB — LIPID PANEL
Chol/HDL Ratio: 4.2 ratio (ref 0.0–4.4)
Cholesterol, Total: 261 mg/dL — ABNORMAL HIGH (ref 100–199)
HDL: 62 mg/dL (ref >39)
LDL Chol Calc (NIH): 171 mg/dL — ABNORMAL HIGH (ref 0–99)
Triglycerides: 157 mg/dL — ABNORMAL HIGH (ref 0–149)
VLDL Cholesterol Cal: 28 mg/dL (ref 5–40)

## 2023-05-30 LAB — B12 AND FOLATE PANEL
Folate: 18.5 ng/mL (ref >3.0)
Vitamin B-12: 306 pg/mL (ref 232–1245)

## 2023-05-30 LAB — TSH+FREE T4
Free T4: 1.09 ng/dL (ref 0.82–1.77)
TSH: 2.2 u[IU]/mL (ref 0.450–4.500)

## 2023-06-04 NOTE — Assessment & Plan Note (Addendum)
BP elevated initially today, but improved on reassessment.  She is currently prescribed losartan 100 mg daily, amlodipine 5 mg daily, and HCTZ 25 mg daily.  She checks her blood pressure daily at home and notes that systolic pressures are typically in the 120s. -No medication change today.  Continue current antihypertensive regimen.

## 2023-06-04 NOTE — Assessment & Plan Note (Signed)
Previously prescribed atorvastatin 10 mg daily, but this was discontinued at the recommendation of her cardiologist due to muscle cramps.  She has tried multiple statins previously and has experienced similar side effects. -Repeat lipid panel ordered today.  Recommended focusing on dietary modifications aimed at improving her cholesterol.  The Mediterranean diet was reviewed.

## 2023-06-30 ENCOUNTER — Ambulatory Visit (INDEPENDENT_AMBULATORY_CARE_PROVIDER_SITE_OTHER): Payer: Medicare Other

## 2023-06-30 VITALS — BP 128/62 | Ht 63.0 in | Wt 153.0 lb

## 2023-06-30 DIAGNOSIS — Z1231 Encounter for screening mammogram for malignant neoplasm of breast: Secondary | ICD-10-CM

## 2023-06-30 DIAGNOSIS — Z0000 Encounter for general adult medical examination without abnormal findings: Secondary | ICD-10-CM

## 2023-06-30 NOTE — Progress Notes (Signed)
Subjective:   Stacey Moody is a 77 y.o. female who presents for Medicare Annual (Subsequent) preventive examination.  Visit Complete: Virtual  I connected with  [name] on 06/30/23 by a audio enabled telemedicine application and verified that I am speaking with the correct person using two identifiers.  Patient Location: Home  Provider Location: Home Office  I discussed the limitations of evaluation and management by telemedicine. The patient expressed understanding and agreed to proceed.  Patient Medicare AWV questionnaire was completed by the patient on 06/23/2023; I have confirmed that all information answered by patient is correct and no changes since this date.  Review of Systems     Cardiac Risk Factors include: advanced age (>62men, >42 women);hypertension     Objective:    Today's Vitals   06/30/23 0802  BP: 128/62  Weight: 153 lb (69.4 kg)  Height: 5\' 3"  (1.6 m)   Body mass index is 27.1 kg/m.     06/30/2023    8:02 AM 04/10/2023    7:57 AM 02/04/2023    3:00 PM 11/28/2021   11:19 AM 11/27/2021    1:11 PM 09/23/2021   11:37 AM 08/23/2021    2:31 PM  Advanced Directives  Does Patient Have a Medical Advance Directive? No Yes No Yes Yes Yes Yes  Type of Furniture conservator/restorer;Living will  Healthcare Power of [place];Living will Healthcare Power of [place];Living will Healthcare Power of [place];Living will Healthcare Power of [place];Living will  Copy of Healthcare Power of Attorney in Chart?     No - copy requested No - copy requested   Would patient like information on creating a medical advance directive? No - Patient declined          Current Medications (verified) Outpatient Encounter Medications as of 06/30/2023  Medication Sig   amLODipine (NORVASC) 5 MG tablet Take 5 mg by mouth daily.   aspirin EC 81 MG tablet Take 81 mg by mouth daily.   cholecalciferol (VITAMIN D) 1000 UNITS tablet Take 1,000 Units by mouth daily.    hydrochlorothiazide (HYDRODIURIL) 25 MG tablet TAKE 1 TABLET BY MOUTH EVERY DAY   losartan (COZAAR) 100 MG tablet TAKE 1 TABLET(100 MG) BY MOUTH DAILY   cetirizine (ZYRTEC) 5 MG tablet Take 1 tablet (5 mg total) by mouth daily. (Patient taking differently: Take 5 mg by mouth daily. As needed)   fluticasone (FLONASE) 50 MCG/ACT nasal spray Place 2 sprays into both nostrils daily. (Patient taking differently: Place 2 sprays into both nostrils as needed.)   Facility-Administered Encounter Medications as of 06/30/2023  Medication   0.9 %  sodium chloride infusion    Allergies (verified) Tetanus toxoids   History: Past Medical History:  Diagnosis Date   Allergy 2015   Tetanus   Carotid artery stenosis    Colon polyps    Diverticulosis    Hyperlipidemia    Hypertension    Kidney stones    Osteopenia    T-2.3   Right renal artery stenosis [facility])    Past Surgical History:  Procedure Laterality Date   ABDOMINAL HYSTERECTOMY  12/22/1980   Partial hysterectomy   COLONOSCOPY N/A 07/30/2016   Procedure: COLONOSCOPY;  Surgeon: Corbin Ade, MD;  Location: [facility];  Service: Endoscopy;  Laterality: N/A;  8:30 AM   FLEXIBLE SIGMOIDOSCOPY  11/28/2021   Procedure: FLEXIBLE SIGMOIDOSCOPY;  Surgeon: Lanelle Bal, DO;  Location: [facility];  Service: Endoscopy;;   LEFT HEART CATH  AND CORONARY ANGIOGRAPHY N/A 01/09/2020   Procedure: LEFT HEART CATH AND CORONARY ANGIOGRAPHY;  Surgeon: Yvonne Kendall, MD;  Location: [facility];  Service: Cardiovascular;  Laterality: N/A;   POLYPECTOMY  07/30/2016   Procedure: POLYPECTOMY;  Surgeon: Corbin Ade, MD;  Location: [facility];  Service: Endoscopy;;  descending colon   SMALL INTESTINE SURGERY     Family History  Problem Relation Age of Onset   Cancer Mother        pancratic   Heart attack Mother    Cancer Father        stomach   Diabetes Father    Hyperlipidemia Father    Heart disease Father        NOT  before age  65   Cancer Sister        lung   Hyperlipidemia Sister    Cancer Brother        thyroid, prostate   Hyperlipidemia Brother    Heart attack Brother    Cancer Sister        colon   Hyperlipidemia Sister    Cancer Sister        colon   Hyperlipidemia Sister    Social History   Socioeconomic History   Marital status: Widowed    Spouse name: Not on file   Number of children: 2   Years of education: Not on file   Highest education level: 12th grade  Occupational History   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 [employer]  Tobacco Use   Smoking status: Former    Packs/day: 0.50    Years: 23.00    Additional pack years: 0.00    Total pack years: 11.50    Types: Cigarettes    Quit date: 12/22/1996    Years since quitting: 26.5   Smokeless tobacco: Never  Vaping Use   Vaping Use: Never used  Substance and Sexual Activity   Alcohol use: No   Drug use: No   Sexual activity: Not Currently    Comment: Widow  Other Topics Concern   Not on file  Social History Narrative   Not on file   Social Determinants of Health   Financial Resource Strain: Low Risk  (06/30/2023)   Overall Financial Resource Strain (CARDIA)    Difficulty of Paying Living Expenses: Not hard at all  Food Insecurity: No Food Insecurity (06/30/2023)   Hunger Vital Sign    Worried About Running Out of Food in the Last Year: Never true    Ran Out of Food in the Last Year: Never true  Transportation Needs: No Transportation Needs (06/30/2023)   PRAPARE - Administrator, Civil Service (Medical): No    Lack of Transportation (Non-Medical): No  Physical Activity: Insufficiently Active (06/30/2023)   Exercise Vital Sign    Days of Exercise per Week: 3 days    Minutes of Exercise per Session: 30 min  Stress: No Stress Concern Present (06/30/2023)   [employer] of Occupational Health - Occupational Stress Questionnaire    Feeling of Stress : Not at all  Social Connections: Moderately Isolated (06/30/2023)    Social Connection and Isolation Panel [NHANES]    Frequency of Communication with Friends and Family: More than three times a week    Frequency of Social Gatherings with Friends and Family: More than three times a week    Attends Religious Services: More than 4 times per year    Active Member of Clubs or Organizations: No  Attends Banker Meetings: Never    Marital Status: Widowed    Tobacco Counseling Counseling given: Yes   Clinical Intake:  Pre-visit preparation completed: Yes  Pain : No/denies pain     BMI - recorded: 27.1 Nutritional Status: BMI 25 -29 Overweight Nutritional Risks: None Diabetes: No  How often do you need to have someone help you when you read instructions, pamphlets, or other written materials from your doctor or pharmacy?: 1 - Never  Interpreter Needed?: No  Information entered by :: Abby Ronelle Michie, CMA   Activities of Daily Living    06/30/2023    8:12 AM 06/23/2023    6:40 PM  In your present state of health, do you have any difficulty performing the following activities:  Hearing? 0 0  Vision? 0 0  Difficulty concentrating or making decisions? 0 0  Walking or climbing stairs? 0 0  Dressing or bathing? 0 0  Doing errands, shopping? 0 0  Preparing Food and eating ? N N  Using the Toilet? N N  In the past six months, have you accidently leaked urine? N N  Do you have problems with loss of bowel control? N N  Managing your Medications? N N  Managing your Finances? N N  Housekeeping or managing your Housekeeping? N N    Patient Care Team: Billie Lade, MD as PCP - General (Internal Medicine) Wendall Stade, MD as PCP - Cardiology (Cardiology)  Indicate any recent Medical Services you may have received from other than [facility] providers in the past year (date may be approximate).     Assessment:   This is a routine wellness examination for Vesna.  Hearing/Vision screen Hearing Screening - Comments:: Patient denies any  hearing difficulties.    Dietary issues and exercise activities discussed:     Goals Addressed             This Visit's Progress    Patient Stated       To remain as healthy as she currently is.        Depression Screen    06/30/2023    8:12 AM 05/29/2023    8:27 AM 02/24/2023    8:27 AM 03/05/2018    8:59 AM 12/02/2017   10:23 AM 11/25/2017    9:21 AM 06/12/2017    9:57 AM  PHQ 2/9 Scores  PHQ - 2 Score 0 0 0 0 0 0 0  PHQ- 9 Score 0 0         Fall Risk    06/30/2023    8:11 AM 06/23/2023    6:40 PM 05/29/2023    8:27 AM 02/24/2023    8:27 AM 07/25/2019    3:26 PM  Fall Risk   Falls in the past year? 0 0 0 0 0  Comment     Emmi Telephone Survey: data to providers prior to load  Number falls in past yr: 0  0 0   Injury with Fall? 0  0 0   Risk for fall due to : No Fall Risks  No Fall Risks No Fall Risks   Follow up Falls prevention discussed  Falls evaluation completed Falls evaluation completed     MEDICARE RISK AT HOME:  Medicare Risk at Home - 06/30/23 0810     Any stairs in or around the home? No    If so, are there any without handrails? No    Home free of loose throw rugs in walkways, pet beds,  electrical cords, etc? Yes    Adequate lighting in your home to reduce risk of falls? Yes    Life alert? No    Use of a cane, walker or w/c? No    Grab bars in the bathroom? No    Shower chair or bench in shower? No    Elevated toilet seat or a handicapped toilet? No             TIMED UP AND GO:  Was the test performed?  No    Cognitive Function:        06/30/2023    8:10 AM  6CIT Screen  What Year? 0 points  What month? 0 points  What time? 0 points  Count back from 20 0 points  Months in reverse 0 points  Repeat phrase 0 points  Total Score 0 points    Immunizations Immunization History  Administered Date(s) Administered   Influenza,inj,Quad PF,6+ Mos 12/31/2014, 10/01/2018   Influenza-Unspecified 11/13/2017   Pneumococcal Conjugate-13 03/15/2015    Pneumococcal Polysaccharide-23 04/29/2011   Td 03/16/2008   Tdap 03/16/2008    TDAP status: Due, Education has been provided regarding the importance of this vaccine. Advised may receive this vaccine at local pharmacy or Health Dept. Aware to provide a copy of the vaccination record if obtained from local pharmacy or Health Dept. Verbalized acceptance and understanding.  Flu Vaccine status: Up to date  Pneumococcal vaccine status: Up to date  Covid-19 vaccine status: Information provided on how to obtain vaccines.   Qualifies for Shingles Vaccine? Yes   Zostavax completed No   Shingrix Completed?: No.    Education has been provided regarding the importance of this vaccine. Patient has been advised to call insurance company to determine out of pocket expense if they have not yet received this vaccine. Advised may also receive vaccine at local pharmacy or Health Dept. Verbalized acceptance and understanding.  Screening Tests Health Maintenance  Topic Date Due   COVID-19 Vaccine (1) Never done   Zoster Vaccines- Shingrix (1 of 2) Never done   DTaP/Tdap/Td (3 - Td or Tdap) 03/16/2018   Medicare Annual Wellness (AWV)  05/31/2023   INFLUENZA VACCINE  07/23/2023   Colonoscopy  09/01/2025   Pneumonia Vaccine 29+ Years old  Completed   DEXA SCAN  Completed   Hepatitis C Screening  Completed   HPV VACCINES  Aged Out    Health Maintenance  Health Maintenance Due  Topic Date Due   COVID-19 Vaccine (1) Never done   Zoster Vaccines- Shingrix (1 of 2) Never done   DTaP/Tdap/Td (3 - Td or Tdap) 03/16/2018   Medicare Annual Wellness (AWV)  05/31/2023    Colorectal cancer screening: Type of screening: Colonoscopy. Completed 09/01/2022. Repeat every 3 years  Mammogram status: Ordered 06/30/2023. Pt provided with contact info and advised to call to schedule appt.   Bone Density status: Completed 07/04/2016. Results reflect: Bone density results: OSTEOPENIA. Repeat every 2-5 years.Patient  declined referral for repeat scanning  Lung Cancer Screening: (Low Dose CT Chest recommended if Age 11-80 years, 20 pack-year currently smoking OR have quit w/in 15years.) does not qualify.   Additional Screening:  Hepatitis C Screening: does not qualify; Completed 01/01/2015  Vision Screening: Recommended annual ophthalmology exams for early detection of glaucoma and other disorders of the eye. Is the patient up to date with their annual eye exam?  Yes  Who is the provider or what is the name of the office in which the patient attends  annual eye exams? [facility] If pt is not established with a provider, would they like to be referred to a provider to establish care? No .   Dental Screening: Recommended annual dental exams for proper oral hygiene  Diabetic Foot Exam: N/A  Community Resource Referral / Chronic Care Management: CRR required this visit?  No   CCM required this visit?  No     Plan:     I have personally reviewed and noted the following in the patient's chart:   Medical and social history Use of alcohol, tobacco or illicit drugs  Current medications and supplements including opioid prescriptions. Patient is not currently taking opioid prescriptions. Functional ability and status Nutritional status Physical activity Advanced directives List of other physicians Hospitalizations, surgeries, and ER visits in previous 12 months Vitals Screenings to include cognitive, depression, and falls Referrals and appointments  In addition, I have reviewed and discussed with patient certain preventive protocols, quality metrics, and best practice recommendations. A written personalized care plan for preventive services as well as general preventive health recommendations were provided to patient.   Any medications not marked as taking were not mentioned by the patient (or their caregiver if applicable) when reconciling the medications.  Because this visit was a virtual/telehealth  visit,  certain criteria was not obtained, such a blood pressure, CBG if patient is a diabetic, and timed up and go.    Jordan Hawks Stace Peace, CMA   06/30/2023   After Visit Summary: (MyChart) Due to this being a telephonic visit, the after visit summary with patients personalized plan was offered to patient via MyChart   Nurse Notes: Mammogram ordered today

## 2023-06-30 NOTE — Patient Instructions (Signed)
Stacey Moody , Thank you for taking time to come for your Medicare Wellness Visit. I appreciate your ongoing commitment to your health goals. Please review the following plan we discussed and let me know if I can assist you in the future.   These are the goals we discussed:  Goals      Patient Stated     To remain as healthy as she currently is.         This is a list of the screening recommended for you and due dates:  Health Maintenance  Topic Date Due   COVID-19 Vaccine (1) Never done   Zoster (Shingles) Vaccine (1 of 2) Never done   DTaP/Tdap/Td vaccine (3 - Td or Tdap) 03/16/2018   Flu Shot  07/23/2023   Medicare Annual Wellness Visit  06/29/2024   Colon Cancer Screening  09/01/2025   Pneumonia Vaccine  Completed   DEXA scan (bone density measurement)  Completed   Hepatitis C Screening  Completed   HPV Vaccine  Aged Out    Advanced directives: Advance directive discussed with you today. Even though you declined this today, please call our office should you change your mind, and we can give you the proper paperwork for you to fill out. Advance care planning is a way to make decisions about medical care that fits your values in case you are ever unable to make these decisions for yourself.  Information on Advanced Care Planning can be found at [facility] Advance Health Care Directives Advance Health Care Directives ([email or web address]    Conditions/risks identified:  You are due for the vaccines checked below. You may have these done at your preferred pharmacy. Please have them fax the office proof of the vaccines so that we can update your chart.   []  Flu (due annually) [x]  Shingrix (Shingles vaccine) []  Pneumonia Vaccines [x]  TDAP (Tetanus) Vaccine every 10 years [x]  Covid-19  You have an order for:  []   2D Mammogram  [x]   3D Mammogram  []   Bone Density   []   Lung Cancer Screening  Please call for appointment:   [facility] [address]iology [place], [place] [address] [phone]  Make sure to wear two-piece clothing.  No lotions powders or deodorants the day of the appointment Make sure to bring picture ID and insurance card.  Bring list of medications you are currently taking including any supplements.   Schedule your [place] screening mammogram through MyChart!   Log into your MyChart account.  Go to 'Visit' (or 'Appointments' if on mobile App) --> Schedule an Appointment  Under 'Select a Reason for Visit' choose the Mammogram Screening option.  Complete the pre-visit questions and select the time and place that best fits your schedule.    Next appointment: VIRTUAL/TELEPHONE APPOINTMENT Follow up in one year for your annual wellness visit  July 27, 2024 @ 8:30am telephone visit   Preventive Care 65 Years and Older, Female Preventive care refers to lifestyle choices and visits with your health care provider that can promote health and wellness. What does preventive care include? A yearly physical exam. This is also called an annual well check. Dental exams once or twice a year. Routine eye exams. Ask your health care provider how often you should have your eyes checked. Personal lifestyle choices, including: Daily care of your teeth and gums. Regular physical activity. Eating a healthy diet. Avoiding tobacco and drug use. Limiting alcohol use. Practicing  safe sex. Taking low-dose aspirin every day. Taking vitamin and mineral supplements as recommended by your health care provider. What happens during an annual well check? The services and screenings done by your health care provider during your annual well check will depend on your age, overall health, lifestyle risk factors, and family history of disease. Counseling  Your health care provider may ask you questions about your: Alcohol use. Tobacco use. Drug use. Emotional well-being. Home and relationship  well-being. Sexual activity. Eating habits. History of falls. Memory and ability to understand (cognition). Work and work Astronomer. Reproductive health. Screening  You may have the following tests or measurements: Height, weight, and BMI. Blood pressure. Lipid and cholesterol levels. These may be checked every 5 years, or more frequently if you are over 9 years old. Skin check. Lung cancer screening. You may have this screening every year starting at age 34 if you have a 30-pack-year history of smoking and currently smoke or have quit within the past 15 years. Fecal occult blood test (FOBT) of the stool. You may have this test every year starting at age 57. Flexible sigmoidoscopy or colonoscopy. You may have a sigmoidoscopy every 5 years or a colonoscopy every 10 years starting at age 63. Hepatitis C blood test. Hepatitis B blood test. Sexually transmitted disease (STD) testing. Diabetes screening. This is done by checking your blood sugar (glucose) after you have not eaten for a while (fasting). You may have this done every 1-3 years. Bone density scan. This is done to screen for osteoporosis. You may have this done starting at age 38. Mammogram. This may be done every 1-2 years. Talk to your health care provider about how often you should have regular mammograms. Talk with your health care provider about your test results, treatment options, and if necessary, the need for more tests. Vaccines  Your health care provider may recommend certain vaccines, such as: Influenza vaccine. This is recommended every year. Tetanus, diphtheria, and acellular pertussis (Tdap, Td) vaccine. You may need a Td booster every 10 years. Zoster vaccine. You may need this after age 27. Pneumococcal 13-valent conjugate (PCV13) vaccine. One dose is recommended after age 63. Pneumococcal polysaccharide (PPSV23) vaccine. One dose is recommended after age 64. Talk to your health care provider about which  screenings and vaccines you need and how often you need them. This information is not intended to replace advice given to you by your health care provider. Make sure you discuss any questions you have with your health care provider. Document Released: 01/04/2016 Document Revised: 08/27/2016 Document Reviewed: 10/09/2015 Elsevier Interactive Patient Education  2017 [employer].  Fall Prevention in the Home Falls can cause injuries. They can happen to people of all ages. There are many things you can do to make your home safe and to help prevent falls. What can I do on the outside of my home? Regularly fix the edges of walkways and driveways and fix any cracks. Remove anything that might make you trip as you walk through a door, such as a raised step or threshold. Trim any bushes or trees on the path to your home. Use bright outdoor lighting. Clear any walking paths of anything that might make someone trip, such as rocks or tools. Regularly check to see if handrails are loose or broken. Make sure that both sides of any steps have handrails. Any raised decks and porches should have guardrails on the edges. Have any leaves, snow, or ice cleared regularly. Use sand or salt  on walking paths during winter. Clean up any spills in your garage right away. This includes oil or grease spills. What can I do in the bathroom? Use night lights. Install grab bars by the toilet and in the tub and shower. Do not use towel bars as grab bars. Use non-skid mats or decals in the tub or shower. If you need to sit down in the shower, use a plastic, non-slip stool. Keep the floor dry. Clean up any water that spills on the floor as soon as it happens. Remove soap buildup in the tub or shower regularly. Attach bath mats securely with double-sided non-slip rug tape. Do not have throw rugs and other things on the floor that can make you trip. What can I do in the bedroom? Use night lights. Make sure that you have a  light by your bed that is easy to reach. Do not use any sheets or blankets that are too big for your bed. They should not hang down onto the floor. Have a firm chair that has side arms. You can use this for support while you get dressed. Do not have throw rugs and other things on the floor that can make you trip. What can I do in the kitchen? Clean up any spills right away. Avoid walking on wet floors. Keep items that you use a lot in easy-to-reach places. If you need to reach something above you, use a strong step stool that has a grab bar. Keep electrical cords out of the way. Do not use floor polish or wax that makes floors slippery. If you must use wax, use non-skid floor wax. Do not have throw rugs and other things on the floor that can make you trip. What can I do with my stairs? Do not leave any items on the stairs. Make sure that there are handrails on both sides of the stairs and use them. Fix handrails that are broken or loose. Make sure that handrails are as long as the stairways. Check any carpeting to make sure that it is firmly attached to the stairs. Fix any carpet that is loose or worn. Avoid having throw rugs at the top or bottom of the stairs. If you do have throw rugs, attach them to the floor with carpet tape. Make sure that you have a light switch at the top of the stairs and the bottom of the stairs. If you do not have them, ask someone to add them for you. What else can I do to help prevent falls? Wear shoes that: Do not have high heels. Have rubber bottoms. Are comfortable and fit you well. Are closed at the toe. Do not wear sandals. If you use a stepladder: Make sure that it is fully opened. Do not climb a closed stepladder. Make sure that both sides of the stepladder are locked into place. Ask someone to hold it for you, if possible. Clearly mark and make sure that you can see: Any grab bars or handrails. First and last steps. Where the edge of each step  is. Use tools that help you move around (mobility aids) if they are needed. These include: Canes. Walkers. Scooters. Crutches. Turn on the lights when you go into a dark area. Replace any light bulbs as soon as they burn out. Set up your furniture so you have a clear path. Avoid moving your furniture around. If any of your floors are uneven, fix them. If there are any pets around you, be aware of where  they are. Review your medicines with your doctor. Some medicines can make you feel dizzy. This can increase your chance of falling. Ask your doctor what other things that you can do to help prevent falls. This information is not intended to replace advice given to you by your health care provider. Make sure you discuss any questions you have with your health care provider. Document Released: 10/04/2009 Document Revised: 05/15/2016 Document Reviewed: 01/12/2015 Elsevier Interactive Patient Education  2017 [employer].

## 2023-07-17 ENCOUNTER — Ambulatory Visit (HOSPITAL_COMMUNITY): Admission: RE | Admit: 2023-07-17 | Payer: Medicare Other | Source: Ambulatory Visit

## 2023-07-17 DIAGNOSIS — Z1231 Encounter for screening mammogram for malignant neoplasm of breast: Secondary | ICD-10-CM | POA: Insufficient documentation

## 2023-10-05 NOTE — Progress Notes (Signed)
Cardiology Office Note    Date:  10/12/2023   ID:  Stacey, Moody May 31, 1946, MRN [id number]   PCP:  Billie Lade, MD   [facility]  Cardiologist:  Charlton Haws, MD     History of Present Illness:  Stacey Moody is a 77 y.o. female with a past medical history significant for hypertension, hyperlipidemia, coronary artery stenosis and possible renal artery stenosis by prior imaging in 2018.  Strong family history of CAD in both parents.  She is a former smoker having quit in 1998. She underwent left heart cath on 01/09/2020 showed diffuse nonobstructive first diagonal, distal LAD and RCA disease.  There was no wall motion abnormality.  Aggressive preventive therapy was recommended with LDL goal of less than 70, blood pressure less than 130/80 and A1c less than 7.  She was continued on high intensity statin.  In ED 09/22/21 with chest pain but left after a long wait. Labs stable, troponin negative x 2, D Dimer up 0.56, CTA no PE, mod to sever biapical scarring or atelectasis, aortic atherosclerosis.   BP  Rx with norvasc , HCTZ and losartan  doing better since then. Still working at dental office 4 days a week and walks 1/4 mile 3-4 times/week.  She complains of bilateral leg pain along her veins.  Dopplers 08/23/21 with no DVT  has had trial of multiple statins with leg cramps. 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    Past Medical History:  Diagnosis Date   Allergy 2015   Tetanus   Carotid artery stenosis    Colon polyps    Diverticulosis    Hyperlipidemia    Hypertension    Kidney stones    Osteopenia    T-2.3   Right renal artery stenosis [facility])     Past Surgical History:  Procedure Laterality Date   ABDOMINAL HYSTERECTOMY  12/22/1980    Partial hysterectomy   COLONOSCOPY N/A 07/30/2016   Procedure: COLONOSCOPY;  Surgeon: Corbin Ade, MD;  Location: [facility];  Service: Endoscopy;  Laterality: N/A;  8:30 AM   FLEXIBLE SIGMOIDOSCOPY  11/28/2021   Procedure: FLEXIBLE SIGMOIDOSCOPY;  Surgeon: Lanelle Bal, DO;  Location: [facility];  Service: Endoscopy;;   LEFT HEART CATH AND CORONARY ANGIOGRAPHY N/A 01/09/2020   Procedure: LEFT HEART CATH AND CORONARY ANGIOGRAPHY;  Surgeon: Yvonne Kendall, MD;  Location: [facility] INVAS[facility];  Service: Cardiovascular;  Laterality: N/A;   POLYPECTOMY  07/30/2016   Procedure: POLYPECTOMY;  Surgeon: Corbin Ade, MD;  Location: [facility];  Service: Endoscopy;;  descending colon   SMALL INTESTINE SURGERY      Current Medications: Current Meds  Medication Sig   amLODipine (NORVASC) 5 MG tablet Take 5 mg by mouth daily.   aspirin EC 81 MG tablet Take 81 mg by mouth daily.   Bempedoic Acid (NEXLETOL) 180 MG TABS Take 1 tablet (180 mg total) by mouth at bedtime.   cetirizine (ZYRTEC) 5 MG tablet Take 1 tablet (5 mg total) by mouth daily. (Patient taking differently: Take 5 mg by mouth daily. As needed)   cholecalciferol (VITAMIN D) 1000 UNITS tablet Take 1,000 Units by mouth daily.   fluticasone (FLONASE) 50 MCG/ACT nasal spray Place 2  sprays into both nostrils daily. (Patient taking differently: Place 2 sprays into both nostrils as needed.)   hydrochlorothiazide (HYDRODIURIL) 25 MG tablet TAKE 1 TABLET BY MOUTH EVERY DAY   losartan (COZAAR) 100 MG tablet TAKE 1 TABLET(100 MG) BY MOUTH DAILY   Current Facility-Administered Medications for the 10/12/23 encounter (Office Visit) with Wendall Stade, MD  Medication   0.9 %  sodium chloride infusion     Allergies:   Tetanus toxoids   Social History   Socioeconomic History   Marital status: Widowed    Spouse name: Not on file   Number of children: 2   Years of education: Not on file   Highest education level: 12th grade   Occupational History   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 crew [employer]  Tobacco Use   Smoking status: Former    Current packs/day: 0.00    Average packs/day: 0.5 packs/day for 23.0 years (11.5 ttl pk-yrs)    Types: Cigarettes    Start date: 12/22/1973    Quit date: 12/22/1996    Years since quitting: 26.8   Smokeless tobacco: Never  Vaping Use   Vaping status: Never Used  Substance and Sexual Activity   Alcohol use: No   Drug use: No   Sexual activity: Not Currently    Comment: Widow  Other Topics Concern   Not on file  Social History Narrative   Not on file   Social Determinants of Health   Financial Resource Strain: Low Risk  (06/30/2023)   Overall Financial Resource Strain (CARDIA)    Difficulty of Paying Living Expenses: Not hard at all  Food Insecurity: No Food Insecurity (06/30/2023)   Hunger Vital Sign    Worried About Running Out of Food in the Last Year: Never true    Ran Out of Food in the Last Year: Never true  Transportation Needs: No Transportation Needs (06/30/2023)   PRAPARE - Administrator, Civil Service (Medical): No    Lack of Transportation (Non-Medical): No  Physical Activity: Insufficiently Active (06/30/2023)   Exercise Vital Sign    Days of Exercise per Week: 3 days    Minutes of Exercise per Session: 30 min  Stress: No Stress Concern Present (06/30/2023)   [employer] of Occupational Health - Occupational Stress Questionnaire    Feeling of Stress : Not at all  Social Connections: Moderately Isolated (06/30/2023)   Social Connection and Isolation Panel [NHANES]    Frequency of Communication with Friends and Family: More than three times a week    Frequency of Social Gatherings with Friends and Family: More than three times a week    Attends Religious Services: More than 4 times per year    Active Member of [employer] or Organizations: No    Attends Banker Meetings: Never    Marital Status: Widowed     Family History:  The patient's   family history includes Cancer in her brother, father, mother, sister, sister, and sister; Diabetes in her father; Heart attack in her brother and mother; Heart disease in her father; Hyperlipidemia in her brother, father, sister, sister, and sister.   ROS:   Please see the history of present illness.    ROS All other systems reviewed and are negative.   PHYSICAL EXAM:   VS:  BP (!) 160/70   Pulse 81   Ht 5\' 3"  (1.6 m)   Wt 155 lb (70.3 kg)   SpO2 98%   BMI 27.46 kg/m   Physical  Exam   Home BP readings reviewed an in normal range   Affect appropriate Healthy:  appears stated age HEENT: normal Neck supple with no adenopathy JVP normal bilateral  bruits no thyromegaly Lungs clear with no wheezing and good diaphragmatic motion Heart:  S1/S2 no murmur, no rub, gallop or click PMI normal Abdomen: benighn, BS positve, no tenderness, no AAA no bruit.  No HSM or HJR Distal pulses intact with no bruits No edema Neuro non-focal Skin warm and dry No muscular weakness   Wt Readings from Last 3 Encounters:  October 16, 2023 155 lb (70.3 kg)  06/30/23 153 lb (69.4 kg)  05/29/23 155 lb (70.3 kg)      Studies/Labs Reviewed:   EKG:    Oct 16, 2023 NSR nonspecific ST changes no changes from prior 10/10/22    Recent Labs: 05/29/2023: ALT 19; BUN 14; Creatinine, Ser 0.78; Hemoglobin 13.1; Platelets 266; Potassium 4.5; Sodium 140; TSH 2.200   Lipid Panel    Component Value Date/Time   CHOL 261 (H) 05/29/2023 0835   TRIG 157 (H) 05/29/2023 0835   HDL 62 05/29/2023 0835   CHOLHDL 4.2 05/29/2023 0835   CHOLHDL 2.0 01/07/2020 0706   VLDL 13 01/07/2020 0706   LDLCALC 171 (H) 05/29/2023 0835    Additional studies/ records that were reviewed today include:  CTA 09/23/21 FINDINGS: Cardiovascular: There is moderate to marked severity calcification of the aortic arch. Satisfactory opacification of the pulmonary arteries to the segmental level. No evidence of pulmonary embolism. Normal heart  size. No pericardial effusion.   Mediastinum/Nodes: No enlarged mediastinal, hilar, or axillary lymph nodes. Thyroid gland, trachea, and esophagus demonstrate no significant findings.   Lungs/Pleura: Moderate to marked severity areas of biapical scarring and/or atelectasis are seen.   Multiple stable, bilateral subcentimeter calcified lung nodules are seen.   There is no evidence of acute infiltrate, pleural effusion or pneumothorax.   Upper Abdomen: There is a small hiatal hernia.   Musculoskeletal: No chest wall abnormality. No acute or significant osseous findings.   Review of the MIP images confirms the above findings.   IMPRESSION: 1. No evidence of pulmonary embolus or other acute intrathoracic process. 2. Moderate to marked severity biapical scarring and/or atelectasis. 3. Small hiatal hernia. 4. Aortic atherosclerosis.   Aortic Atherosclerosis (ICD10-I70.0).     Electronically Signed   By: Aram Candela M.D.   On: 09/23/2021 20:13   Carotid Dopplers  01/16/23  1-39% bilateral stenosis    Cardiac catheterization 01/09/2020 Conclusions: Mild, non-obstructive coronary artery disease, including 10-20% distal LMCA and 30% proximal RCA stenoses. Low left ventricular filling pressure.   Recommendations: Medical therapy and risk factor modification to prevent progression of coronary artery disease. Post-cath hydration.   Yvonne Kendall, MD [facility]  2D echo 01/07/2020 IMPRESSIONS     1. Left ventricular ejection fraction, by visual estimation, is 60 to  65%. The left ventricle has normal function. There is mildly increased  left ventricular hypertrophy.   2. The left ventricle has no regional wall motion abnormalities.   3. Global right ventricle has normal systolic function.The right  ventricular size is normal. No increase in right ventricular wall  thickness.   4. Left atrial size was normal.   5. Right atrial size was normal.   6. The mitral  valve is normal in structure. No evidence of mitral valve  regurgitation. No evidence of mitral stenosis.   7. The tricuspid valve is normal in structure.   8. The aortic valve is normal in  structure. Aortic valve regurgitation is  not visualized. No evidence of aortic valve sclerosis or stenosis.   9. The pulmonic valve was normal in structure. Pulmonic valve  regurgitation is not visualized.  10. TR signal is inadequate for assessing pulmonary artery systolic  pressure.  11. The inferior vena cava is normal in size with greater than 50%  respiratory variability, suggesting right atrial pressure of 3 mmHg.    PLAN:  In order of problems listed above:  CAD:  non obstructive on cath 01/09/20, normal LVEF, aggressive risk factor mod-  Continue current meds  HTN much better controlled with the addition of amlodipine  Home readings are in normal range   HLD LDL 171 with non obstructive CAD and carotid plaque Start Nexlitol Samples given F/U lipid clinic with repeat labs in 3 months if compliant and tolerates   Carotid stenosis 40 to 59% LICA on Dopplers 09/13/2021 Updated 01/16/23 and read by radiology as only 1-39% bilateral stenosis with antegrade vertebral flow   Leg Pain:  improved off statins Good pulses no claudication no need for ABI's   Shared Decision Making/Informed Consent    Referral to lipid clinic  Lipid/Liver 3 months Start Nexlitol Samples given   F/U in a year     Signed, Charlton Haws, MD  10/12/2023 9:46 AM    [facility] [address] [place], [place], [place]  [address] Phone: [phone]; Fax: [phone]

## 2023-10-12 ENCOUNTER — Encounter: Payer: Self-pay | Admitting: Cardiovascular Disease

## 2023-10-12 ENCOUNTER — Ambulatory Visit: Payer: Medicare Other | Attending: Cardiovascular Disease | Admitting: Cardiovascular Disease

## 2023-10-12 VITALS — BP 160/70 | HR 81 | Ht 63.0 in | Wt 155.0 lb

## 2023-10-12 DIAGNOSIS — E785 Hyperlipidemia, unspecified: Secondary | ICD-10-CM | POA: Insufficient documentation

## 2023-10-12 DIAGNOSIS — I6523 Occlusion and stenosis of bilateral carotid arteries: Secondary | ICD-10-CM | POA: Diagnosis not present

## 2023-10-12 DIAGNOSIS — I10 Essential (primary) hypertension: Secondary | ICD-10-CM | POA: Insufficient documentation

## 2023-10-12 MED ORDER — NEXLETOL 180 MG PO TABS: 180.0000 mg | ORAL_TABLET | Freq: Every day | ORAL | 11 refills | Status: DC

## 2023-10-12 NOTE — Patient Instructions (Signed)
Medication Instructions:   START Nexletol 180 mg daily for cholesterol   Samples given  Labwork: None today  Testing/Procedures: None today  Follow-Up: 1 year Dr.Nishan   You have been referred to Lipid management clinic in [place]. They will call you to schedula appointment.   Any Other Special Instructions Will Be Listed Below (If Applicable).  If you need a refill on your cardiac medications before your next appointment, please call your pharmacy.

## 2023-10-16 ENCOUNTER — Telehealth: Payer: Self-pay | Admitting: Internal Medicine

## 2023-10-16 ENCOUNTER — Other Ambulatory Visit (HOSPITAL_COMMUNITY): Payer: Self-pay

## 2023-10-16 ENCOUNTER — Telehealth: Payer: Self-pay | Admitting: Pharmacy Technician

## 2023-10-16 ENCOUNTER — Other Ambulatory Visit: Payer: Self-pay

## 2023-10-16 MED ORDER — LOSARTAN POTASSIUM 100 MG PO TABS: 100.0000 mg | ORAL_TABLET | Freq: Every day | ORAL | 2 refills | Status: DC

## 2023-10-16 MED ORDER — HYDROCHLOROTHIAZIDE 25 MG PO TABS: 25.0000 mg | ORAL_TABLET | Freq: Every day | ORAL | 2 refills | Status: DC

## 2023-10-16 MED ORDER — FLUTICASONE PROPIONATE 50 MCG/ACT NA SUSP: 2.0000 | Freq: Every day | NASAL | 6 refills | Status: AC

## 2023-10-16 NOTE — Telephone Encounter (Signed)
Patient notified

## 2023-10-16 NOTE — Telephone Encounter (Signed)
Pharmacy Patient Advocate Encounter  Received notification from [facility] that Prior Authorization for nexletol has been APPROVED from 10/16/23 to 04/15/24. Ran test claim, Copay is $47.00 one month. This test claim was processed through [facility]- copay amounts may vary at other pharmacies due to pharmacy/plan contracts, or as the patient moves through the different stages of their insurance plan.   PA #/Case ID/Reference #: [id number]

## 2023-10-16 NOTE — Telephone Encounter (Signed)
.  Prescription Request  10/16/2023  LOV: 05/29/2023  What is the name of the medication or equipment? losartan (COZAAR) 100 MG tablet [[id number]]    fluticasone (FLONASE) 50 MCG/ACT nasal spray [[id number]]   hydrochlorothiazide (HYDRODIURIL) 25 MG tablet [[id number]]    Have you contacted your pharmacy to request a refill? No   Which pharmacy would you like this sent to?  [facility] [phone] - [place], [place] - [address] AT [facility] OF [address] & [address] [place] [address] Phone: [phone] Fax: [phone]    Patient notified that their request is being sent to the clinical staff for review and that they should receive a response within 2 business days.   Please advise at  walked into office

## 2023-10-16 NOTE — Telephone Encounter (Signed)
Pharmacy Patient Advocate Encounter   Received notification from CoverMyMeds that prior authorization for nexletol is required/requested.   Insurance verification completed.   The patient is insured through [facility] .   Per test claim: PA required; PA submitted to [facility] via CoverMyMeds Key/confirmation #/EOC [id number] Status is pending

## 2023-10-16 NOTE — Telephone Encounter (Signed)
Refills sent to pharmacy. 

## 2023-10-26 ENCOUNTER — Telehealth: Payer: Self-pay | Admitting: Cardiovascular Disease

## 2023-10-26 NOTE — Telephone Encounter (Signed)
Pt c/o medication issue:  1. Name of Medication:  Bempedoic Acid (NEXLETOL) 180 MG TABS  2. How are you currently taking this medication (dosage and times per day)?   3. Are you having a reaction (difficulty breathing--STAT)?   4. What is your medication issue?   See previous encounter. Patient is following up because she has contacted 2 additional pharmacies who informed her that they do not have Nexletol in stock.

## 2023-10-26 NOTE — Telephone Encounter (Signed)
I am going to run this by our PharmD to see what their advice is.

## 2023-10-27 NOTE — Telephone Encounter (Signed)
I spoke with patient and she says [facility] has ordered it for her.

## 2023-10-27 NOTE — Telephone Encounter (Signed)
Most pharmacies are not going to keep this in stock. They will have to order it. I would just have patient ask the pharmacy to order. Or you could see if patient would like to get from a [facility] pharmacy. If you send to [facility] long and patient calls and requests it to be mailed or fills out this form [email or web address]  They can mail it to her. Or if she wants to come to [place] she can.

## 2023-12-04 ENCOUNTER — Encounter: Payer: Self-pay | Admitting: Internal Medicine

## 2023-12-04 ENCOUNTER — Ambulatory Visit (INDEPENDENT_AMBULATORY_CARE_PROVIDER_SITE_OTHER): Payer: Medicare Other | Admitting: Internal Medicine

## 2023-12-04 VITALS — BP 157/71 | HR 72 | Ht 63.0 in | Wt 155.2 lb

## 2023-12-04 DIAGNOSIS — E785 Hyperlipidemia, unspecified: Secondary | ICD-10-CM

## 2023-12-04 DIAGNOSIS — M85859 Other specified disorders of bone density and structure, unspecified thigh: Secondary | ICD-10-CM | POA: Diagnosis not present

## 2023-12-04 DIAGNOSIS — I10 Essential (primary) hypertension: Secondary | ICD-10-CM

## 2023-12-04 NOTE — Assessment & Plan Note (Signed)
Lipid panel updated in June.  Total cholesterol 261 and LDL 171.  She has tried multiple statins previously but experienced adverse side effects of myalgias.  Most recently, Nexletol 180 mg daily was started per cardiology.  She has been referred to the lipid clinic and will repeat labs in early 2025.

## 2023-12-04 NOTE — Progress Notes (Signed)
Established Patient Office Visit  Subjective   Patient ID: Stacey Moody, female    DOB: [birth year]  Age: 77 y.o. MRN: [id number]  Chief Complaint  Patient presents with   Hypertension    Six month follow up    Stacey Moody returns to care today for routine follow-up.  She was last evaluated by me on 6/7.  No medication changes were made, repeat labs were ordered, and 37-month follow-up was arranged.  In the interim, she has been seen by cardiology for follow-up.  There have otherwise been no acute interval events.  Stacey Moody reports feeling well today.  She is asymptomatic and has no acute concerns to discuss.  Past Medical History:  Diagnosis Date   Allergy 2015   Tetanus   Carotid artery stenosis    Colon polyps    Diverticulosis    Hyperlipidemia    Hypertension    Kidney stones    Osteopenia    T-2.3   Right renal artery stenosis [facility])    Past Surgical History:  Procedure Laterality Date   ABDOMINAL HYSTERECTOMY  12/22/1980   Partial hysterectomy   COLONOSCOPY N/A 07/30/2016   Procedure: COLONOSCOPY;  Surgeon: Corbin Ade, MD;  [facility];  Service: Endoscopy;  Laterality: N/A;  8:30 AM   FLEXIBLE SIGMOIDOSCOPY  11/28/2021   Procedure: FLEXIBLE SIGMOIDOSCOPY;  Surgeon: Lanelle Bal, DO;  [facility];  Service: Endoscopy;;   LEFT HEART CATH AND CORONARY ANGIOGRAPHY N/A 01/09/2020   Procedure: LEFT HEART CATH AND CORONARY ANGIOGRAPHY;  Surgeon: Yvonne Kendall, MD;  [facility];  Service: Cardiovascular;  Laterality: N/A;   POLYPECTOMY  07/30/2016   Procedure: POLYPECTOMY;  Surgeon: Corbin Ade, MD;  [facility];  Service: Endoscopy;;  descending colon   SMALL INTESTINE SURGERY     Social History   Tobacco Use   Smoking status: Former    Current packs/day: 0.00    Average packs/day: 0.5 packs/day for 23.0 years (11.5 ttl pk-yrs)    Types: Cigarettes    Start date: 12/22/1973    Quit date: 12/22/1996    Years  since quitting: 26.9   Smokeless tobacco: Never  Vaping Use   Vaping status: Never Used  Substance Use Topics   Alcohol use: No   Drug use: No   Family History  Problem Relation Age of Onset   Cancer Mother        pancratic   Heart attack Mother    Cancer Father        stomach   Diabetes Father    Hyperlipidemia Father    Heart disease Father        NOT  before age 53   Cancer Sister        lung   Hyperlipidemia Sister    Cancer Brother        thyroid, prostate   Hyperlipidemia Brother    Heart attack Brother    Cancer Sister        colon   Hyperlipidemia Sister    Cancer Sister        colon   Hyperlipidemia Sister    Allergies  Allergen Reactions   Tetanus Toxoids Swelling   Review of Systems  Constitutional:  Negative for chills and fever.  HENT:  Negative for sore throat.   Respiratory:  Negative for cough and shortness of breath.   Cardiovascular:  Negative for chest pain, palpitations and leg swelling.  Gastrointestinal:  Negative for abdominal pain, blood in stool, constipation, diarrhea, nausea and vomiting.  Genitourinary:  Negative for dysuria and hematuria.  Musculoskeletal:  Negative for myalgias.  Skin:  Negative for itching and rash.  Neurological:  Negative for dizziness and headaches.  Psychiatric/Behavioral:  Negative for depression and suicidal ideas.      Objective:     BP (!) 157/71 (BP Location: Left Arm, Patient Position: Sitting, Cuff Size: Normal)   Pulse 72   Ht 5\' 3"  (1.6 m)   Wt 155 lb 3.2 oz (70.4 kg)   SpO2 96%   BMI 27.49 kg/m  BP Readings from Last 3 Encounters:  12/04/23 (!) 157/71  10/12/23 (!) 160/70  06/30/23 128/62   Physical Exam Vitals reviewed.  Constitutional:      General: She is not in acute distress.    Appearance: Normal appearance. She is not toxic-appearing.  HENT:     Head: Normocephalic and atraumatic.     Right Ear: External ear normal.     Left Ear: External ear normal.     Nose: Nose normal. No  congestion or rhinorrhea.     Mouth/Throat:     Mouth: Mucous membranes are moist.     Pharynx: Oropharynx is clear. No oropharyngeal exudate or posterior oropharyngeal erythema.  Eyes:     General: No scleral icterus.    Extraocular Movements: Extraocular movements intact.     Conjunctiva/sclera: Conjunctivae normal.     Pupils: Pupils are equal, round, and reactive to light.  Cardiovascular:     Rate and Rhythm: Normal rate and regular rhythm.     Pulses: Normal pulses.     Heart sounds: Normal heart sounds. No murmur heard.    No friction rub. No gallop.  Pulmonary:     Effort: Pulmonary effort is normal.     Breath sounds: Normal breath sounds. No wheezing, rhonchi or rales.  Abdominal:     General: Abdomen is flat. Bowel sounds are normal. There is no distension.     Palpations: Abdomen is soft.     Tenderness: There is no abdominal tenderness.  Musculoskeletal:        General: No swelling. Normal range of motion.     Cervical back: Normal range of motion.     Right lower leg: No edema.     Left lower leg: No edema.  Lymphadenopathy:     Cervical: No cervical adenopathy.  Skin:    General: Skin is warm and dry.     Capillary Refill: Capillary refill takes less than 2 seconds.     Coloration: Skin is not jaundiced.  Neurological:     General: No focal deficit present.     Mental Status: She is alert and oriented to person, place, and time.  Psychiatric:        Mood and Affect: Mood normal.        Behavior: Behavior normal.   Last CBC Lab Results  Component Value Date   WBC 5.6 05/29/2023   HGB 13.1 05/29/2023   HCT 38.9 05/29/2023   MCV 95 05/29/2023   MCH 31.9 05/29/2023   RDW 12.5 05/29/2023   PLT 266 05/29/2023   Last metabolic panel Lab Results  Component Value Date   GLUCOSE 112 (H) 05/29/2023   NA 140 05/29/2023   K 4.5 05/29/2023   CL 102 05/29/2023   CO2 24 05/29/2023   BUN 14 05/29/2023   CREATININE 0.78 05/29/2023   EGFR 78 05/29/2023    CALCIUM 10.0  05/29/2023   PROT 7.0 05/29/2023   ALBUMIN 4.4 05/29/2023   LABGLOB 2.6 05/29/2023   AGRATIO 1.7 05/29/2023   BILITOT 0.3 05/29/2023   ALKPHOS 86 05/29/2023   AST 22 05/29/2023   ALT 19 05/29/2023   ANIONGAP 11 02/04/2023   Last lipids Lab Results  Component Value Date   CHOL 261 (H) 05/29/2023   HDL 62 05/29/2023   LDLCALC 171 (H) 05/29/2023   TRIG 157 (H) 05/29/2023   CHOLHDL 4.2 05/29/2023   Last hemoglobin A1c Lab Results  Component Value Date   HGBA1C 6.2 (H) 05/29/2023   Last thyroid functions Lab Results  Component Value Date   TSH 2.200 05/29/2023   Last vitamin D Lab Results  Component Value Date   VD25OH 42.1 02/24/2023   Last vitamin B12 and Folate Lab Results  Component Value Date   VITAMINB12 306 05/29/2023   FOLATE 18.5 05/29/2023   The 10-year ASCVD risk score (Arnett DK, et al., 2019) is: 39.8%    Assessment & Plan:   Problem List Items Addressed This Visit     Hypertension - Primary   BP is elevated in office today.  She reports elevated home readings over the last 2 weeks attributed to stress around the holidays.  She remains on losartan 100 mg daily, amlodipine 5 mg daily, and HCTZ 25 mg daily.  HTN has previously been well-controlled on his regimen. -Through shared decision making, no medication changes were made today.  She will continue to monitor her blood pressure at home and if readings remain elevated after the holidays we will increase amlodipine to 10 mg daily.      Osteopenia   T-score -2.3 on last available DEXA from 2017.  She remains on vitamin D and calcium supplementation.  Reports undergoing an updated DEXA in the interim with this report is not available currently.      Hyperlipidemia   Lipid panel updated in June.  Total cholesterol 261 and LDL 171.  She has tried multiple statins previously but experienced adverse side effects of myalgias.  Most recently, Nexletol 180 mg daily was started per cardiology.  She  has been referred to the lipid clinic and will repeat labs in early 2025.       Return in about 6 months (around 06/03/2024).    Billie Lade, MD

## 2023-12-04 NOTE — Assessment & Plan Note (Signed)
BP is elevated in office today.  She reports elevated home readings over the last 2 weeks attributed to stress around the holidays.  She remains on losartan 100 mg daily, amlodipine 5 mg daily, and HCTZ 25 mg daily.  HTN has previously been well-controlled on his regimen. -Through shared decision making, no medication changes were made today.  She will continue to monitor her blood pressure at home and if readings remain elevated after the holidays we will increase amlodipine to 10 mg daily.

## 2023-12-04 NOTE — Assessment & Plan Note (Signed)
T-score -2.3 on last available DEXA from 2017.  She remains on vitamin D and calcium supplementation.  Reports undergoing an updated DEXA in the interim with this report is not available currently.

## 2023-12-04 NOTE — Patient Instructions (Signed)
It was a pleasure to see you today.  Thank you for giving [place] the opportunity to be involved in your care.  Below is a brief recap of your visit and next steps.  We will plan to see you again in 6 months.  Summary No medication changes today Please let me know if your blood pressure remains elevated after the holidays Follow up in 6 months

## 2023-12-08 ENCOUNTER — Ambulatory Visit
Admission: EM | Admit: 2023-12-08 | Discharge: 2023-12-08 | Disposition: A | Discharge status: Home / Self Care | Payer: Medicare Other | Attending: Nurse Practitioner | Admitting: Nurse Practitioner

## 2023-12-08 ENCOUNTER — Encounter: Payer: Self-pay | Admitting: Emergency Medicine

## 2023-12-08 DIAGNOSIS — B9789 Other viral agents as the cause of diseases classified elsewhere: Secondary | ICD-10-CM

## 2023-12-08 DIAGNOSIS — J329 Chronic sinusitis, unspecified: Secondary | ICD-10-CM | POA: Diagnosis not present

## 2023-12-08 MED ORDER — CETIRIZINE HCL 10 MG PO TABS: 10.0000 mg | ORAL_TABLET | Freq: Every day | ORAL | 0 refills | Status: DC

## 2023-12-08 MED ORDER — GUAIFENESIN 100 MG/5ML PO LIQD: 10.0000 mL | Freq: Four times a day (QID) | ORAL | 0 refills | Status: AC | PRN

## 2023-12-08 NOTE — ED Triage Notes (Signed)
Head congestion since Sunday.  Nausea and dizziness that started this morning.

## 2023-12-08 NOTE — Discharge Instructions (Signed)
Take medication as directed. Increase fluids and get plenty of rest. May take over-the-counter Tylenol as needed for pain, fever, or general discomfort. Recommend normal saline nasal spray to help with nasal congestion throughout the day. For your cough, it may be helpful to use a humidifier at bedtime during sleep. You may follow-up in this clinic or with your primary care physician if symptoms have not improved over the next several days. Follow-up as needed.

## 2023-12-08 NOTE — ED Provider Notes (Signed)
[facility]    CSN: [id number] Arrival date & time: 12/08/23  0801      History   Chief Complaint No chief complaint on file.   HPI NEFERTITI LIPPARD is a 77 y.o. female.   The history is provided by the patient.   Patient presents with a 2-day history of nasal congestion, headache, cough, right ear pressure, with nausea and dizziness.  Patient states cough, nausea and dizziness started this morning.  She denies fever, chills, ear drainage, wheezing, difficulty breathing, chest pain, abdominal pain, nausea, vomiting, diarrhea, or rash.  Patient denies history of seasonal allergies or sick contacts.  States that she did take a home COVID test this morning which was negative.  Patient states that she has not taken any medication for her symptoms.  Past Medical History:  Diagnosis Date   Allergy 2015   Tetanus   Carotid artery stenosis    Colon polyps    Diverticulosis    Hyperlipidemia    Hypertension    Kidney stones    Osteopenia    T-2.3   Right renal artery stenosis [facility])     Patient Active Problem List   Diagnosis Date Noted   Vitamin D deficiency 02/25/2023   Right flank pain 02/25/2023   Right renal artery stenosis (HCC)    Osteopenia    Carotid artery stenosis 01/03/2014   Hypertension    Hyperlipidemia    Occlusion and stenosis of carotid artery without mention of cerebral infarction 06/22/2012    Past Surgical History:  Procedure Laterality Date   ABDOMINAL HYSTERECTOMY  12/22/1980   Partial hysterectomy   COLONOSCOPY N/A 07/30/2016   Procedure: COLONOSCOPY;  Surgeon: Corbin Ade, MD;  [facility];  Service: Endoscopy;  Laterality: N/A;  8:30 AM   FLEXIBLE SIGMOIDOSCOPY  11/28/2021   Procedure: FLEXIBLE SIGMOIDOSCOPY;  Surgeon: Lanelle Bal, DO;  [facility];  Service: Endoscopy;;   LEFT HEART CATH AND CORONARY ANGIOGRAPHY N/A 01/09/2020   Procedure: LEFT HEART CATH AND CORONARY ANGIOGRAPHY;  Surgeon: Yvonne Kendall, MD;  [facility];  Service: Cardiovascular;  Laterality: N/A;   POLYPECTOMY  07/30/2016   Procedure: POLYPECTOMY;  Surgeon: Corbin Ade, MD;  [facility];  Service: Endoscopy;;  descending colon   SMALL INTESTINE SURGERY      OB History   No obstetric history on file.      Home Medications    Prior to Admission medications   Medication Sig Start Date End Date Taking? Authorizing Provider  amLODipine (NORVASC) 5 MG tablet Take 5 mg by mouth daily. 09/26/21   [provider]  aspirin EC 81 MG tablet Take 81 mg by mouth daily.    [provider]  Bempedoic Acid (NEXLETOL) 180 MG TABS Take 1 tablet (180 mg total) by mouth at bedtime. 10/12/23   Wendall Stade, MD  cetirizine (ZYRTEC) 5 MG tablet Take 1 tablet (5 mg total) by mouth daily. Patient taking differently: Take 5 mg by mouth daily. As needed 03/18/22   Wallis Bamberg, PA-C  cholecalciferol (VITAMIN D) 1000 UNITS tablet Take 1,000 Units by mouth daily.    [provider]  fluticasone (FLONASE) 50 MCG/ACT nasal spray Place 2 sprays into both nostrils daily. 10/16/23   Billie Lade, MD  hydrochlorothiazide (HYDRODIURIL) 25 MG tablet Take 1 tablet (25 mg total) by mouth daily. 10/16/23   Billie Lade, MD  losartan (COZAAR) 100 MG tablet Take  1 tablet (100 mg total) by mouth daily. 10/16/23   Billie Lade, MD    Family History Family History  Problem Relation Age of Onset   Cancer Mother        pancratic   Heart attack Mother    Cancer Father        stomach   Diabetes Father    Hyperlipidemia Father    Heart disease Father        NOT  before age 33   Cancer Sister        lung   Hyperlipidemia Sister    Cancer Brother        thyroid, prostate   Hyperlipidemia Brother    Heart attack Brother    Cancer Sister        colon   Hyperlipidemia Sister    Cancer Sister        colon   Hyperlipidemia Sister     Social History Social History    Tobacco Use   Smoking status: Former    Current packs/day: 0.00    Average packs/day: 0.5 packs/day for 23.0 years (11.5 ttl pk-yrs)    Types: Cigarettes    Start date: 12/22/1973    Quit date: 12/22/1996    Years since quitting: 26.9   Smokeless tobacco: Never  Vaping Use   Vaping status: Never Used  Substance Use Topics   Alcohol use: No   Drug use: No     Allergies   Tetanus toxoids   Review of Systems Review of Systems Per HPI  Physical Exam Triage Vital Signs ED Triage Vitals  Encounter Vitals Group     BP 12/08/23 0810 136/71     Systolic BP Percentile --      Diastolic BP Percentile --      Pulse Rate 12/08/23 0810 76     Resp 12/08/23 0810 18     Temp 12/08/23 0810 98.3 F (36.8 C)     Temp Source 12/08/23 0810 Oral     SpO2 12/08/23 0810 97 %     Weight --      Height --      Head Circumference --      Peak Flow --      Pain Score 12/08/23 0811 5     Pain Loc --      Pain Education --      Exclude from Growth Chart --    No data found.  Updated Vital Signs BP 136/71 (BP Location: Right Arm)   Pulse 76   Temp 98.3 F (36.8 C) (Oral)   Resp 18   SpO2 97%   Visual Acuity Right Eye Distance:   Left Eye Distance:   Bilateral Distance:    Right Eye Near:   Left Eye Near:    Bilateral Near:     Physical Exam Vitals and nursing note reviewed.  Constitutional:      General: She is not in acute distress.    Appearance: Normal appearance.  HENT:     Head: Normocephalic.     Right Ear: Ear canal and external ear normal. A middle ear effusion is present.     Left Ear: Tympanic membrane, ear canal and external ear normal.     Nose: Congestion present.     Right Turbinates: Enlarged and swollen.     Left Turbinates: Enlarged and swollen.     Right Sinus: No maxillary sinus tenderness or frontal sinus tenderness.     Left Sinus:  No maxillary sinus tenderness or frontal sinus tenderness.     Mouth/Throat:     Lips: Pink.     Mouth: Mucous  membranes are moist.     Pharynx: Uvula midline. Postnasal drip present. No pharyngeal swelling, oropharyngeal exudate or posterior oropharyngeal erythema.     Comments: Cobblestoning present to posterior oropharynx  Eyes:     Extraocular Movements: Extraocular movements intact.     Conjunctiva/sclera: Conjunctivae normal.     Pupils: Pupils are equal, round, and reactive to light.  Cardiovascular:     Rate and Rhythm: Normal rate and regular rhythm.     Pulses: Normal pulses.     Heart sounds: Normal heart sounds.  Pulmonary:     Effort: Pulmonary effort is normal. No respiratory distress.     Breath sounds: Normal breath sounds. No stridor. No wheezing, rhonchi or rales.  Abdominal:     General: Bowel sounds are normal.     Palpations: Abdomen is soft.     Tenderness: There is no abdominal tenderness.  Musculoskeletal:     Cervical back: Normal range of motion.  Lymphadenopathy:     Cervical: No cervical adenopathy.  Skin:    General: Skin is warm and dry.  Neurological:     General: No focal deficit present.     Mental Status: She is alert and oriented to person, place, and time.  Psychiatric:        Mood and Affect: Mood normal.        Behavior: Behavior normal.      UC Treatments / Results  Labs (all labs ordered are listed, but only abnormal results are displayed) Labs Reviewed - No data to display  EKG   Radiology No results found.  Procedures Procedures (including critical care time)  Medications Ordered in UC Medications - No data to display  Initial Impression / Assessment and Plan / UC Course  I have reviewed the triage vital signs and the nursing notes.  Pertinent labs & imaging results that were available during my care of the patient were reviewed by me and considered in my medical decision making (see chart for details).  On exam, lung sounds are clear throughout, room air sats at 97%.  Patient does have a right middle ear effusion which most  likely is contributing to the nausea and dizziness he is experiencing.  Negative home COVID test this morning.  Suspect viral sinusitis at this time.  Patient has not taken any medication for her symptoms.  Will provide symptomatic treatment with cetirizine 10 mg daily and Robitussin 100 mg for her cough.  Patient reports she does have Flonase at home, advised patient to start that medication.  Supportive care recommendations were provided and discussed with the patient to include fluids, rest, Tylenol for pain or discomfort, and normal saline nasal spray.  Discussed indications with the patient of when follow-up will be indicated.  Patient was in agreement with this plan of care and verbalized understanding.  All questions were answered.  Patient stable for discharge.   Final Clinical Impressions(s) / UC Diagnoses   Final diagnoses:  None   Discharge Instructions   None    ED Prescriptions   None    PDMP not reviewed this encounter.   Abran Cantor, NP 12/08/23 [phone]

## 2024-02-29 ENCOUNTER — Ambulatory Visit: Payer: Self-pay | Admitting: Internal Medicine

## 2024-02-29 NOTE — Telephone Encounter (Signed)
 Red Word that prompted transfer to Nurse Triage: Patient is calling to report right ear pain with dizziness for off an on for one week. Previous dx of vertigo.       Chief Complaint: Right ear pain with dizziness at times. Symptoms: Above Frequency: 1 week Pertinent Negatives: Patient denies fever Disposition: [] ED /[] Urgent Care (no appt availability in office) / [x] Appointment(In office/virtual)/ []  [facility]/ [] Home Care/ [] Refused Recommended Disposition /[] [facility]/ []  Follow-up with PCP Additional Notes: Agrees with appointment.  Reason for Disposition  Earache  (Exceptions: brief ear pain of < 60 minutes duration, earache occurring during air travel  Answer Assessment - Initial Assessment Questions 1. LOCATION: "Which ear is involved?"     Right ear 2. ONSET: "When did the ear start hurting"      1 week 3. SEVERITY: "How bad is the pain?"  (Scale 1-10; mild, moderate or severe)   - MILD (1-3): doesn't interfere with normal activities    - MODERATE (4-7): interferes with normal activities or awakens from sleep    - SEVERE (8-10): excruciating pain, unable to do any normal activities      Mild 4. URI SYMPTOMS: "Do you have a runny nose or cough?"     Ear pain 5. FEVER: "Do you have a fever?" If Yes, ask: "What is your temperature, how was it measured, and when did it start?"     No 6. CAUSE: "Have you been swimming recently?", "How often do you use Q-TIPS?", "Have you had any recent air travel or scuba diving?"     No 7. OTHER SYMPTOMS: "Do you have any other symptoms?" (e.g., headache, stiff neck, dizziness, vomiting, runny nose, decreased hearing)     No 8. PREGNANCY: "Is there any chance you are pregnant?" "When was your last menstrual period?"     No  Protocols used: Davina Poke

## 2024-03-01 ENCOUNTER — Encounter: Payer: Self-pay | Admitting: Family Medicine

## 2024-03-01 ENCOUNTER — Ambulatory Visit (INDEPENDENT_AMBULATORY_CARE_PROVIDER_SITE_OTHER): Admitting: Family Medicine

## 2024-03-01 VITALS — BP 136/74 | HR 73 | Temp 97.9°F | Ht 63.0 in

## 2024-03-01 DIAGNOSIS — H6990 Unspecified Eustachian tube disorder, unspecified ear: Secondary | ICD-10-CM | POA: Insufficient documentation

## 2024-03-01 DIAGNOSIS — H6991 Unspecified Eustachian tube disorder, right ear: Secondary | ICD-10-CM | POA: Diagnosis not present

## 2024-03-01 MED ORDER — IPRATROPIUM BROMIDE 0.06 % NA SOLN: 2.0000 | Freq: Four times a day (QID) | NASAL | 0 refills | Status: DC | PRN

## 2024-03-01 NOTE — Telephone Encounter (Signed)
 Pt. Agrees to appointment at different practice that has availability.

## 2024-03-01 NOTE — Assessment & Plan Note (Signed)
 Normal exam. Atrovent as directed. Supportive care.

## 2024-03-01 NOTE — Progress Notes (Signed)
 Subjective:  Patient ID: Stacey Moody, female    DOB: 03/07/1946  Age: 78 y.o. MRN: [id number]  CC:   Chief Complaint  Patient presents with   Ear Pain    Ear pain x's 1 week pain comes and goes. Some dizziness as well. History of virtigo    HPI:  78 year old female presents for evaluation of the above.  1 week of intermittent discomfort of the right ear. Feels full. Has had vertigo in the past and this feels different. Has been taking Zyrtec and Flonase without resolution.  Patient Active Problem List   Diagnosis Date Noted   Eustachian tube dysfunction 03/01/2024   Vitamin D deficiency 02/25/2023   Right renal artery stenosis (HCC)    Osteopenia    Carotid artery stenosis 01/03/2014   Hypertension    Hyperlipidemia     Social Hx   Social History   Socioeconomic History   Marital status: Widowed    Spouse name: Not on file   Number of children: 2   Years of education: Not on file   Highest education level: 12th grade  Occupational History   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  Tobacco Use   Smoking status: Former    Current packs/day: 0.00    Average packs/day: 0.5 packs/day for 23.0 years (11.5 ttl pk-yrs)    Types: Cigarettes    Start date: 12/22/1973    Quit date: 12/22/1996    Years since quitting: 27.2   Smokeless tobacco: Never  Vaping Use   Vaping status: Never Used  Substance and Sexual Activity   Alcohol use: No   Drug use: No   Sexual activity: Not Currently    Comment: Widow  Other Topics Concern   Not on file  Social History Narrative   Not on file   Social Drivers of Health   Financial Resource Strain: Low Risk  (12/01/2023)   Overall Financial Resource Strain (CARDIA)    Difficulty of Paying Living Expenses: Not hard at all  Food Insecurity: No Food Insecurity (02/29/2024)   Hunger Vital Sign    Worried About Running Out of Food in the Last Year: Never true    Ran Out of Food in the Last Year: Never true  Transportation Needs: No  Transportation Needs (02/29/2024)   PRAPARE - Administrator, Civil Service (Medical): No    Lack of Transportation (Non-Medical): No  Physical Activity: Insufficiently Active (02/29/2024)   Exercise Vital Sign    Days of Exercise per Week: 3 days    Minutes of Exercise per Session: 30 min  Stress: No Stress Concern Present (02/29/2024)   [employer] of Occupational Health - Occupational Stress Questionnaire    Feeling of Stress : Not at all  Social Connections: Moderately Isolated (02/29/2024)   Social Connection and Isolation Panel [NHANES]    Frequency of Communication with Friends and Family: More than three times a week    Frequency of Social Gatherings with Friends and Family: Twice a week    Attends Religious Services: More than 4 times per year    Active Member of [employer] or Organizations: No    Attends Banker Meetings: Never    Marital Status: Widowed    Review of Systems Per HPI  Objective:  BP 136/74   Pulse 73   Temp 97.9 F (36.6 C)   Ht 5\' 3"  (1.6 m)   SpO2 97%   BMI 27.49 kg/m  03/01/2024    3:37 PM 12/08/2023    8:10 AM 12/04/2023    8:09 AM  BP/Weight  Systolic BP 136 136 157  Diastolic BP 74 71 71    Physical Exam Vitals and nursing note reviewed.  Constitutional:      General: She is not in acute distress. HENT:     Head: Normocephalic and atraumatic.     Right Ear: Tympanic membrane normal.     Left Ear: Tympanic membrane normal.  Cardiovascular:     Rate and Rhythm: Normal rate and regular rhythm.  Pulmonary:     Effort: Pulmonary effort is normal.     Breath sounds: Normal breath sounds. No wheezing or rales.  Neurological:     Mental Status: She is alert.     Lab Results  Component Value Date   WBC 5.6 05/29/2023   HGB 13.1 05/29/2023   HCT 38.9 05/29/2023   PLT 266 05/29/2023   GLUCOSE 112 (H) 05/29/2023   CHOL 261 (H) 05/29/2023   TRIG 157 (H) 05/29/2023   HDL 62 05/29/2023   LDLCALC 171 (H)  05/29/2023   ALT 19 05/29/2023   AST 22 05/29/2023   NA 140 05/29/2023   K 4.5 05/29/2023   CL 102 05/29/2023   CREATININE 0.78 05/29/2023   BUN 14 05/29/2023   CO2 24 05/29/2023   TSH 2.200 05/29/2023   HGBA1C 6.2 (H) 05/29/2023     Assessment & Plan:  Dysfunction of right eustachian tube Assessment & Plan: Normal exam. Atrovent as directed. Supportive care.   Other orders -     Ipratropium Bromide; Place 2 sprays into both nostrils 4 (four) times daily as needed for rhinitis.  Dispense: 15 mL; Refill: 0   Follow-up:  Return if symptoms worsen or fail to improve.  Everlene Other DO [facility]

## 2024-03-07 ENCOUNTER — Ambulatory Visit: Payer: Medicare Other | Admitting: Internal Medicine

## 2024-03-22 ENCOUNTER — Other Ambulatory Visit (HOSPITAL_COMMUNITY): Payer: Self-pay

## 2024-04-25 ENCOUNTER — Telehealth: Payer: Self-pay | Admitting: Internal Medicine

## 2024-04-26 MED ORDER — AMLODIPINE BESYLATE 5 MG PO TABS: 5.0000 mg | ORAL_TABLET | Freq: Every day | ORAL | 0 refills | Status: DC

## 2024-04-26 NOTE — Telephone Encounter (Signed)
 Med sent to [facility] as requested

## 2024-04-26 NOTE — Telephone Encounter (Signed)
 Patient is wanting this sent to [facility] on [address] not [facility]. Please advise  Thank you

## 2024-06-03 ENCOUNTER — Ambulatory Visit (INDEPENDENT_AMBULATORY_CARE_PROVIDER_SITE_OTHER): Payer: Medicare Other | Admitting: Internal Medicine

## 2024-06-03 ENCOUNTER — Encounter: Payer: Self-pay | Admitting: Internal Medicine

## 2024-06-03 VITALS — BP 150/72 | HR 65 | Ht 63.0 in | Wt 157.6 lb

## 2024-06-03 DIAGNOSIS — E785 Hyperlipidemia, unspecified: Secondary | ICD-10-CM

## 2024-06-03 DIAGNOSIS — I10 Essential (primary) hypertension: Secondary | ICD-10-CM | POA: Diagnosis not present

## 2024-06-03 DIAGNOSIS — R252 Cramp and spasm: Secondary | ICD-10-CM | POA: Diagnosis not present

## 2024-06-03 DIAGNOSIS — E559 Vitamin D deficiency, unspecified: Secondary | ICD-10-CM | POA: Diagnosis not present

## 2024-06-03 MED ORDER — AMLODIPINE BESYLATE 10 MG PO TABS: 10.0000 mg | ORAL_TABLET | Freq: Every day | ORAL | 3 refills | Status: AC

## 2024-06-03 NOTE — Patient Instructions (Signed)
 It was a pleasure to see you today.  Thank you for giving us  the opportunity to be involved in your care.  Below is a brief recap of your visit and next steps.  We will plan to see you again in 6 months.  Summary Increase amlodipine  to 10 mg daily Repeat labs ordered I recommend trying a magnesium supplement to help with leg cramping at night Follow up in 6 months

## 2024-06-03 NOTE — Assessment & Plan Note (Signed)
 Her additional concern today is intermittent leg cramps occurring at night over the last 2 months.  Will update labs today to screen for electrolyte abnormalities.  Recommend starting an over-the-counter magnesium supplement for symptom relief.

## 2024-06-03 NOTE — Assessment & Plan Note (Signed)
 She is currently prescribed Nexletol  180 mg daily.  She has been referred to the lipid clinic and has an appointment scheduled for next month (7/14).

## 2024-06-03 NOTE — Assessment & Plan Note (Signed)
 BP remains elevated today.  Her current antihypertensive regimen includes losartan  100 mg daily, amlodipine  5 mg daily, and HCTZ 25 mg daily. -Increase amlodipine  to 10 mg daily for improved HTN control.  Continue losartan  and HCTZ as currently prescribed.

## 2024-06-03 NOTE — Progress Notes (Signed)
 Established Patient Office Visit  Subjective   Patient ID: Stacey Moody, female    DOB: 1946/12/19  Age: 78 y.o. MRN: [id number]  Chief Complaint  Patient presents with   Hypertension    Six month follow up    leg cramps    Leg cramping at night    Stacey Moody returns to care today for routine follow-up.  She was last evaluated by me in December 2024.  No medication changes were made at that time and 31-month follow-up was arranged for reassessment.  In the interim, she presented to urgent care on 12/08/23 endorsing URI symptoms.  Acute visit at [facility] family [facility] on 3/11 endorsing right ear discomfort.  There have otherwise been no acute interval events. Stacey Moody reports feeling fairly well today.  She endorses intermittent cramping in her legs at night.  This has been occurring for the last 2 months.  She does not have any additional concerns to discuss today.  Past Medical History:  Diagnosis Date   Allergy 2015   Tetanus   Carotid artery stenosis    Colon polyps    Diverticulosis    Hyperlipidemia    Hypertension    Kidney stones    Osteopenia    T-2.3   Right renal artery stenosis Stacey Moody)    Past Surgical History:  Procedure Laterality Date   ABDOMINAL HYSTERECTOMY  12/22/1980   Partial hysterectomy   COLONOSCOPY N/A 07/30/2016   Procedure: COLONOSCOPY;  Surgeon: Suzette Espy, MD;  [facility];  Service: Endoscopy;  Laterality: N/A;  8:30 AM   FLEXIBLE SIGMOIDOSCOPY  11/28/2021   Procedure: FLEXIBLE SIGMOIDOSCOPY;  Surgeon: Vinetta Greening, DO;  [facility];  Service: Endoscopy;;   LEFT HEART CATH AND CORONARY ANGIOGRAPHY N/A 01/09/2020   Procedure: LEFT HEART CATH AND CORONARY ANGIOGRAPHY;  Surgeon: Sammy Crisp, MD;  [facility];  Service: Cardiovascular;  Laterality: N/A;   POLYPECTOMY  07/30/2016   Procedure: POLYPECTOMY;  Surgeon: Suzette Espy, MD;  [facility];  Service: Endoscopy;;  descending colon    SMALL INTESTINE SURGERY     Social History   Tobacco Use   Smoking status: Former    Current packs/day: 0.00    Average packs/day: 0.5 packs/day for 23.0 years (11.5 ttl pk-yrs)    Types: Cigarettes    Start date: 12/22/1973    Quit date: 12/22/1996    Years since quitting: 27.4   Smokeless tobacco: Never  Vaping Use   Vaping status: Never Used  Substance Use Topics   Alcohol  use: No   Drug use: No   Family History  Problem Relation Age of Onset   Cancer Mother        pancratic   Heart attack Mother    Cancer Father        stomach   Diabetes Father    Hyperlipidemia Father    Heart disease Father        NOT  before age 68   Cancer Sister        lung   Hyperlipidemia Sister    Cancer Brother        thyroid , prostate   Hyperlipidemia Brother    Heart attack Brother    Cancer Sister        colon   Hyperlipidemia Sister    Cancer Sister        colon   Hyperlipidemia Sister    Allergies  Allergen Reactions  Tetanus Toxoids Swelling   Review of Systems  Constitutional:  Negative for chills and fever.  HENT:  Negative for sore throat.   Respiratory:  Negative for cough and shortness of breath.   Cardiovascular:  Negative for chest pain, palpitations and leg swelling.  Gastrointestinal:  Negative for abdominal pain, blood in stool, constipation, diarrhea, nausea and vomiting.  Genitourinary:  Negative for dysuria and hematuria.  Musculoskeletal:  Negative for myalgias.       Nocturnal leg cramps  Skin:  Negative for itching and rash.  Neurological:  Negative for dizziness and headaches.  Psychiatric/Behavioral:  Negative for depression and suicidal ideas.      Objective:     BP (!) 150/72   Pulse 65   Ht 5' 3 (1.6 m)   Wt 157 lb 9.6 oz (71.5 kg)   SpO2 99%   BMI 27.92 kg/m  BP Readings from Last 3 Encounters:  06/03/24 (!) 150/72  03/01/24 136/74  12/08/23 136/71   Physical Exam Vitals reviewed.  Constitutional:      General: She is not in  acute distress.    Appearance: Normal appearance. She is not toxic-appearing.  HENT:     Head: Normocephalic and atraumatic.     Right Ear: External ear normal.     Left Ear: External ear normal.     Nose: Nose normal. No congestion or rhinorrhea.     Mouth/Throat:     Mouth: Mucous membranes are moist.     Pharynx: Oropharynx is clear. No oropharyngeal exudate or posterior oropharyngeal erythema.   Eyes:     General: No scleral icterus.    Extraocular Movements: Extraocular movements intact.     Conjunctiva/sclera: Conjunctivae normal.     Pupils: Pupils are equal, round, and reactive to light.    Cardiovascular:     Rate and Rhythm: Normal rate and regular rhythm.     Pulses: Normal pulses.     Heart sounds: Normal heart sounds. No murmur heard.    No friction rub. No gallop.  Pulmonary:     Effort: Pulmonary effort is normal.     Breath sounds: Normal breath sounds. No wheezing, rhonchi or rales.  Abdominal:     General: Abdomen is flat. Bowel sounds are normal. There is no distension.     Palpations: Abdomen is soft.     Tenderness: There is no abdominal tenderness.   Musculoskeletal:        General: No swelling. Normal range of motion.     Cervical back: Normal range of motion.     Right lower leg: No edema.     Left lower leg: No edema.  Lymphadenopathy:     Cervical: No cervical adenopathy.   Skin:    General: Skin is warm and dry.     Capillary Refill: Capillary refill takes less than 2 seconds.     Coloration: Skin is not jaundiced.   Neurological:     General: No focal deficit present.     Mental Status: She is alert and oriented to person, place, and time.   Psychiatric:        Mood and Affect: Mood normal.        Behavior: Behavior normal.   Last CBC Lab Results  Component Value Date   WBC 5.6 05/29/2023   HGB 13.1 05/29/2023   HCT 38.9 05/29/2023   MCV 95 05/29/2023   MCH 31.9 05/29/2023   RDW 12.5 05/29/2023   PLT 266 05/29/2023   Last  metabolic panel Lab Results  Component Value Date   GLUCOSE 112 (H) 05/29/2023   NA 140 05/29/2023   K 4.5 05/29/2023   CL 102 05/29/2023   CO2 24 05/29/2023   BUN 14 05/29/2023   CREATININE 0.78 05/29/2023   EGFR 78 05/29/2023   CALCIUM  10.0 05/29/2023   PROT 7.0 05/29/2023   ALBUMIN 4.4 05/29/2023   LABGLOB 2.6 05/29/2023   AGRATIO 1.7 05/29/2023   BILITOT 0.3 05/29/2023   ALKPHOS 86 05/29/2023   AST 22 05/29/2023   ALT 19 05/29/2023   ANIONGAP 11 02/04/2023   Last lipids Lab Results  Component Value Date   CHOL 261 (H) 05/29/2023   HDL 62 05/29/2023   LDLCALC 171 (H) 05/29/2023   TRIG 157 (H) 05/29/2023   CHOLHDL 4.2 05/29/2023   Last hemoglobin A1c Lab Results  Component Value Date   HGBA1C 6.2 (H) 05/29/2023   Last thyroid  functions Lab Results  Component Value Date   TSH 2.200 05/29/2023   Last vitamin D  Lab Results  Component Value Date   VD25OH 42.1 02/24/2023   Last vitamin B12 and Folate Lab Results  Component Value Date   VITAMINB12 306 05/29/2023   FOLATE 18.5 05/29/2023   The 10-year ASCVD risk score (Arnett DK, et al., 2019) is: 37%    Assessment & Plan:   Problem List Items Addressed This Visit       Hypertension - Primary   BP remains elevated today.  Her current antihypertensive regimen includes losartan  100 mg daily, amlodipine  5 mg daily, and HCTZ 25 mg daily. -Increase amlodipine  to 10 mg daily for improved HTN control.  Continue losartan  and HCTZ as currently prescribed.      Hyperlipidemia   She is currently prescribed Nexletol  180 mg daily.  She has been referred to the lipid clinic and has an appointment scheduled for next month (7/14).      Bilateral leg cramps   Her additional concern today is intermittent leg cramps occurring at night over the last 2 months.  Will update labs today to screen for electrolyte abnormalities.  Recommend starting an over-the-counter magnesium supplement for symptom relief.      Return in  about 6 months (around 12/03/2024).   Tobi Fortes, MD

## 2024-06-04 ENCOUNTER — Ambulatory Visit: Payer: Self-pay | Admitting: Internal Medicine

## 2024-06-04 LAB — CBC WITH DIFFERENTIAL/PLATELET
Basophils Absolute: 0.1 10*3/uL (ref 0.0–0.2)
Basos: 1 %
EOS (ABSOLUTE): 0.2 10*3/uL (ref 0.0–0.4)
Eos: 3 %
Hematocrit: 38.9 % (ref 34.0–46.6)
Hemoglobin: 12.6 g/dL (ref 11.1–15.9)
Immature Grans (Abs): 0 10*3/uL (ref 0.0–0.1)
Immature Granulocytes: 0 %
Lymphocytes Absolute: 1.6 10*3/uL (ref 0.7–3.1)
Lymphs: 27 %
MCH: 31.5 pg (ref 26.6–33.0)
MCHC: 32.4 g/dL (ref 31.5–35.7)
MCV: 97 fL (ref 79–97)
Monocytes Absolute: 0.6 10*3/uL (ref 0.1–0.9)
Monocytes: 11 %
Neutrophils Absolute: 3.4 10*3/uL (ref 1.4–7.0)
Neutrophils: 58 %
Platelets: 286 10*3/uL (ref 150–450)
RBC: 4 x10E6/uL (ref 3.77–5.28)
RDW: 12.5 % (ref 11.7–15.4)
WBC: 5.9 10*3/uL (ref 3.4–10.8)

## 2024-06-04 LAB — CMP14+EGFR
ALT: 14 IU/L (ref 0–32)
AST: 19 IU/L (ref 0–40)
Albumin: 4.7 g/dL (ref 3.8–4.8)
Alkaline Phosphatase: 57 IU/L (ref 44–121)
BUN/Creatinine Ratio: 23 (ref 12–28)
BUN: 21 mg/dL (ref 8–27)
Bilirubin Total: 0.3 mg/dL (ref 0.0–1.2)
CO2: 23 mmol/L (ref 20–29)
Calcium: 9.6 mg/dL (ref 8.7–10.3)
Chloride: 104 mmol/L (ref 96–106)
Creatinine, Ser: 0.9 mg/dL (ref 0.57–1.00)
Globulin, Total: 2.2 g/dL (ref 1.5–4.5)
Glucose: 119 mg/dL — ABNORMAL HIGH (ref 70–99)
Potassium: 4.6 mmol/L (ref 3.5–5.2)
Sodium: 141 mmol/L (ref 134–144)
Total Protein: 6.9 g/dL (ref 6.0–8.5)
eGFR: 65 mL/min/{1.73_m2} (ref >59)

## 2024-06-04 LAB — TSH+FREE T4
Free T4: 1.2 ng/dL (ref 0.82–1.77)
TSH: 2.64 u[IU]/mL (ref 0.450–4.500)

## 2024-06-04 LAB — MAGNESIUM: Magnesium: 1.8 mg/dL (ref 1.6–2.3)

## 2024-06-04 LAB — VITAMIN D 25 HYDROXY (VIT D DEFICIENCY, FRACTURES): Vit D, 25-Hydroxy: 53.1 ng/mL (ref 30.0–100.0)

## 2024-06-04 LAB — HEMOGLOBIN A1C
Est. average glucose Bld gHb Est-mCnc: 128 mg/dL
Hgb A1c MFr Bld: 6.1 % — ABNORMAL HIGH (ref 4.8–5.6)

## 2024-06-04 LAB — B12 AND FOLATE PANEL
Folate: 11.5 ng/mL (ref >3.0)
Vitamin B-12: 329 pg/mL (ref 232–1245)

## 2024-06-15 ENCOUNTER — Encounter (HOSPITAL_BASED_OUTPATIENT_CLINIC_OR_DEPARTMENT_OTHER): Payer: Self-pay | Admitting: Internal Medicine

## 2024-06-15 ENCOUNTER — Ambulatory Visit (HOSPITAL_BASED_OUTPATIENT_CLINIC_OR_DEPARTMENT_OTHER): Admitting: Internal Medicine

## 2024-06-15 VITALS — BP 144/56 | HR 70 | Ht 63.0 in | Wt 157.6 lb

## 2024-06-15 DIAGNOSIS — I6523 Occlusion and stenosis of bilateral carotid arteries: Secondary | ICD-10-CM | POA: Diagnosis not present

## 2024-06-15 DIAGNOSIS — T466X5D Adverse effect of antihyperlipidemic and antiarteriosclerotic drugs, subsequent encounter: Secondary | ICD-10-CM

## 2024-06-15 DIAGNOSIS — M791 Myalgia, unspecified site: Secondary | ICD-10-CM | POA: Diagnosis not present

## 2024-06-15 DIAGNOSIS — I2510 Atherosclerotic heart disease of native coronary artery without angina pectoris: Secondary | ICD-10-CM | POA: Diagnosis not present

## 2024-06-15 DIAGNOSIS — E785 Hyperlipidemia, unspecified: Secondary | ICD-10-CM | POA: Diagnosis not present

## 2024-06-15 NOTE — Patient Instructions (Signed)
 Medication Instructions:  Nexletol  Holiday for 2 weeks, call if any symptoms arise, follow up via MyChart in 2 weeks  *If you need a refill on your cardiac medications before your next appointment, please call your pharmacy*  Lab Work: NMR and LPa today or as soon as able If you have labs (blood work) drawn today and your tests are completely normal, you will receive your results only by: MyChart Message (if you have MyChart) OR A paper copy in the mail If you have any lab test that is abnormal or we need to change your treatment, we will call you to review the results.  Follow-Up: At [facility], you and your health needs are our priority.  As part of our continuing mission to provide you with exceptional heart care, our providers are all part of one team.  This team includes your primary Cardiologist (physician) and Advanced Practice Providers or APPs (Physician Assistants and Nurse Practitioners) who all work together to provide you with the care you need, when you need it.  Your next appointment:   TBD- pending lab results

## 2024-06-15 NOTE — Progress Notes (Signed)
 LIPID CLINIC CONSULT NOTE  Chief Complaint:  Manage dyslipidemia  Primary Care Physician: Bevely Doffing, FNP  Primary Cardiologist:  Maude Emmer, MD  HPI:  Stacey Moody is a 78 y.o. female who is being seen today for the evaluation of dyslipidemia at the request of [name] [name], MD. This is a 78 year old female patient of Dr. Emmer, last seen in October 2024 in [place] with a history of heart disease that strong in both parents and cardiac catheterization 2021 which showed diffuse nonobstructive coronary artery disease.  Aggressive lipid-lowering was recommended.  She has previously been tried on high intensity atorvastatin  and rosuvastatin  however noted significant muscle aches related to these and she stopped the medications.  She also has a history of carotid artery stenosis with mild to moderate bilateral carotid artery disease.  Most recent labs in 2024 showed total cholesterol 261, triglycerides 157, HDL 62 and LDL 171.  At that time he provided samples for Nexletol  and recommended a lipid clinic referral.  She initially had an appointment but had to cancel and it was rescheduled and she is now seen today in follow-up.  Overall she has been on the Nexletol  now for probably about 9 months.  She said fairly soon after starting the medication she developed cramping and pain in her legs and the back of her left thigh.  She never contacted the office to express this discomfort nor did she take any holiday from the medication to see if it was responsible for it.  She has not had repeat lipid testing.  PMHx:  Past Medical History:  Diagnosis Date   Allergy 2015   Tetanus   Carotid artery stenosis    Colon polyps    Diverticulosis    Hyperlipidemia    Hypertension    Kidney stones    Osteopenia    T-2.3   Right renal artery stenosis [facility])     Past Surgical History:  Procedure Laterality Date   ABDOMINAL HYSTERECTOMY  12/22/1980   Partial hysterectomy   COLONOSCOPY N/A  07/30/2016   Procedure: COLONOSCOPY;  Surgeon: Lamar CHRISTELLA Hollingshead, MD;  [facility];  Service: Endoscopy;  Laterality: N/A;  8:30 AM   FLEXIBLE SIGMOIDOSCOPY  11/28/2021   Procedure: FLEXIBLE SIGMOIDOSCOPY;  Surgeon: Cindie Carlin POUR, DO;  [facility];  Service: Endoscopy;;   LEFT HEART CATH AND CORONARY ANGIOGRAPHY N/A 01/09/2020   Procedure: LEFT HEART CATH AND CORONARY ANGIOGRAPHY;  Surgeon: Mady Bruckner, MD;  [facility];  Service: Cardiovascular;  Laterality: N/A;   POLYPECTOMY  07/30/2016   Procedure: POLYPECTOMY;  Surgeon: Lamar CHRISTELLA Hollingshead, MD;  [facility];  Service: Endoscopy;;  descending colon   SMALL INTESTINE SURGERY      FAMHx:  Family History  Problem Relation Age of Onset   Cancer Mother        pancratic   Heart attack Mother    Cancer Father        stomach   Diabetes Father    Hyperlipidemia Father    Heart disease Father        NOT  before age 70   Cancer Sister        lung   Hyperlipidemia Sister    Cancer Brother        thyroid , prostate   Hyperlipidemia Brother    Heart attack Brother    Cancer Sister        colon   Hyperlipidemia Sister  Cancer Sister        colon   Hyperlipidemia Sister     SOCHx:   reports that she quit smoking about 27 years ago. Her smoking use included cigarettes. She started smoking about 50 years ago. She has a 11.5 pack-year smoking history. She has never used smokeless tobacco. She reports that she does not drink alcohol  and does not use drugs.  ALLERGIES:  Allergies  Allergen Reactions   Tetanus Toxoids Swelling    ROS: Pertinent items noted in HPI and remainder of comprehensive ROS otherwise negative.  HOME MEDS: Current Outpatient Medications on File Prior to Visit  Medication Sig Dispense Refill   amLODipine  (NORVASC ) 10 MG tablet Take 1 tablet (10 mg total) by mouth daily. 90 tablet 3   aspirin  EC 81 MG tablet Take 81 mg by mouth daily.     Bempedoic Acid   (NEXLETOL ) 180 MG TABS Take 1 tablet (180 mg total) by mouth at bedtime. 30 tablet 11   cetirizine  (ZYRTEC ) 10 MG tablet Take 1 tablet (10 mg total) by mouth daily. 30 tablet 0   cholecalciferol  (VITAMIN D ) 1000 UNITS tablet Take 1,000 Units by mouth daily.     fluticasone  (FLONASE ) 50 MCG/ACT nasal spray Place 2 sprays into both nostrils daily. 16 g 6   hydrochlorothiazide  (HYDRODIURIL ) 25 MG tablet Take 1 tablet (25 mg total) by mouth daily. 90 tablet 2   losartan  (COZAAR ) 100 MG tablet Take 1 tablet (100 mg total) by mouth daily. 90 tablet 2   No current facility-administered medications on file prior to visit.    LABS/IMAGING: No results found for this or any previous visit (from the past 48 hours). No results found.  LIPID PANEL:    Component Value Date/Time   CHOL 261 (H) 05/29/2023 0835   TRIG 157 (H) 05/29/2023 0835   HDL 62 05/29/2023 0835   CHOLHDL 4.2 05/29/2023 0835   CHOLHDL 2.0 01/07/2020 0706   VLDL 13 01/07/2020 0706   LDLCALC 171 (H) 05/29/2023 0835    WEIGHTS: Wt Readings from Last 3 Encounters:  06/15/24 157 lb 9.6 oz (71.5 kg)  06/03/24 157 lb 9.6 oz (71.5 kg)  12/04/23 155 lb 3.2 oz (70.4 kg)    VITALS: BP (!) 144/56 (BP Location: Left Arm, Patient Position: Sitting, Cuff Size: Normal)   Pulse 70   Ht 5' 3 (1.6 m)   Wt 157 lb 9.6 oz (71.5 kg)   SpO2 98%   BMI 27.92 kg/m   EXAM: Deferred  EKG: Deferred  ASSESSMENT: Dyslipidemia, goal LDL less than 55 (very high risk) Mild to moderate nonobstructive coronary artery disease by cath (2021) Bilateral carotid artery disease Statin intolerant-myalgias  PLAN: 1.   [name] has a dyslipidemia which is persistent and was started on Nexletol  due to statin intolerance.  She has been taking this for probably 9 months but has had side effects she says for the duration of therapy.  I would advise a holiday off the medicine since I am now hearing about this the first time.  If her symptoms improve then it  may be a tendinitis related to Nexletol  which is a side effect in about 2% of patients.  Ultimately she may be a good candidate for Repatha although she has some concerns about using it after reading the side effect profiles but has never actually been prescribed a medication.  My concern is that her risk is quite high of progressive coronary artery disease leading to either stroke or clinical  cardiac events such as chest pain, heart attack or the need for bypass grafting.  Will plan to repeat lipids today including NMR and LP(a).  This is expecting that we will likely have to stop the Nexletol  and consider alternative therapy options.  Thanks again for the kind referral.  Vinie KYM Maxcy, MD, [facility], FNLA, FACP  [place]  [facility]  Medical Director of the Advanced Lipid Disorders &  Cardiovascular Risk Reduction Clinic Diplomate of the American Board of Clinical Lipidology Attending Cardiologist  Direct Dial: [phone]  Fax: [phone]  Website:  [email or web address] Vinie [name] Tammee Thielke 06/15/2024, 12:16 PM

## 2024-06-17 ENCOUNTER — Ambulatory Visit: Payer: Self-pay | Admitting: Internal Medicine

## 2024-06-17 LAB — LIPOPROTEIN A (LPA): Lipoprotein (a): 9.4 nmol/L (ref <75.0)

## 2024-06-28 ENCOUNTER — Encounter (HOSPITAL_BASED_OUTPATIENT_CLINIC_OR_DEPARTMENT_OTHER): Payer: Self-pay | Admitting: Internal Medicine

## 2024-06-28 DIAGNOSIS — E785 Hyperlipidemia, unspecified: Secondary | ICD-10-CM

## 2024-06-29 ENCOUNTER — Other Ambulatory Visit (HOSPITAL_COMMUNITY): Payer: Self-pay

## 2024-06-29 ENCOUNTER — Telehealth: Payer: Self-pay

## 2024-06-29 NOTE — Telephone Encounter (Signed)
 Please review and advise for alternate therapy

## 2024-06-29 NOTE — Telephone Encounter (Signed)
 PA request has been Submitted. New Encounter has been or will be created for follow up. For additional info see Pharmacy Prior Auth telephone encounter from 06/29/24.

## 2024-06-29 NOTE — Telephone Encounter (Signed)
 Pharmacy Patient Advocate Encounter  Received notification from [facility] that Prior Authorization for REPATHA has been APPROVED from 12/23/23 to 12/21/24. Ran test claim, Copay is $144.24. This test claim was processed through [facility]- copay amounts may vary at other pharmacies due to pharmacy/plan contracts, or as the patient moves through the different stages of their insurance plan.

## 2024-06-29 NOTE — Telephone Encounter (Signed)
 Pharmacy Patient Advocate Encounter   Received notification from Physician's Office that prior authorization for REPATHA is required/requested.   Insurance verification completed.   The patient is insured through [facility] .   Per test claim: PA required; PA submitted to above mentioned insurance via CoverMyMeds Key/confirmation #/EOC [id number] Status is pending

## 2024-07-01 ENCOUNTER — Other Ambulatory Visit (HOSPITAL_COMMUNITY): Payer: Self-pay | Admitting: Internal Medicine

## 2024-07-01 DIAGNOSIS — Z1231 Encounter for screening mammogram for malignant neoplasm of breast: Secondary | ICD-10-CM

## 2024-07-04 ENCOUNTER — Institutional Professional Consult (permissible substitution) (HOSPITAL_BASED_OUTPATIENT_CLINIC_OR_DEPARTMENT_OTHER): Admitting: Internal Medicine

## 2024-07-05 ENCOUNTER — Ambulatory Visit
Admission: RE | Admit: 2024-07-05 | Discharge: 2024-07-05 | Disposition: A | Discharge status: Home / Self Care | Source: Ambulatory Visit | Attending: Internal Medicine | Admitting: Internal Medicine

## 2024-07-05 VITALS — BP 168/73 | HR 80 | Temp 97.8°F | Resp 16

## 2024-07-05 DIAGNOSIS — H6993 Unspecified Eustachian tube disorder, bilateral: Secondary | ICD-10-CM | POA: Diagnosis not present

## 2024-07-05 DIAGNOSIS — J069 Acute upper respiratory infection, unspecified: Secondary | ICD-10-CM | POA: Diagnosis not present

## 2024-07-05 LAB — POC SARS CORONAVIRUS 2 AG -  ED: SARS Coronavirus 2 Ag: NEGATIVE

## 2024-07-05 MED ORDER — CETIRIZINE HCL 5 MG PO TABS: 5.0000 mg | ORAL_TABLET | Freq: Every day | ORAL | 0 refills | Status: AC

## 2024-07-05 NOTE — Discharge Instructions (Addendum)
 You have a viral illness which will improve on its own with rest, fluids, and medications to help with your symptoms.  Tylenol , guaifenesin -DM (plain mucinex ), and saline nasal sprays may help relieve symptoms.   Flonase  2 puffs into each nostril daily to relieve nasal pressure.  Zyrtec  5mg  tablet once daily to clear up fluid behind ear drums.   Two teaspoons of honey in 1 cup of warm water  every 4-6 hours may help with throat pains.  Humidifier in room at nighttime may help soothe cough (clean well daily).   For chest pain, shortness of breath, inability to keep food or fluids down without vomiting, fever that does not respond to tylenol  or motrin, or any other severe symptoms, please go to the ER for further evaluation. Return to urgent care as needed, otherwise follow-up with PCP.

## 2024-07-05 NOTE — ED Triage Notes (Signed)
 Pt reports she has a cough, dizziness, and ear muffled x 3 days

## 2024-07-05 NOTE — ED Provider Notes (Signed)
 [facility]    CSN: [id number] Arrival date & time: 07/05/24  1057      History   Chief Complaint Chief Complaint  Patient presents with   Cough    Entered by patient    HPI Stacey Moody is a 78 y.o. female.   Stacey Moody is a 78 y.o. female presenting for chief complaint of cough, nasal congestion, fatigue, and bilateral ear pressure that started 3 days ago. Cough is productive with yellow/clear phlegm. She reports bilateral chest tightness associated with coughing and taking deep breaths, chest tightness goes away when she is not coughing. Denies shortness of breath, leg swelling, heart palpitations, orthopnea, fever/chills, nausea, vomiting, diarrhea, headache, and rash. She felt dizzy momentarily last night when she was changing positions from sitting to standing and vice versa, denies current dizziness. Ears feel full and muffled. Denies drainage from the ears, tinnitus, etc. No history of chronic respiratory problems like asthma/copd. Former smoker, quit almost 30 years ago. Denies recent exposure to sick contacts with similar symptoms. She is taking guaifenesin -DM at home with some relief of cough/congestion. Using Flonase  as well.    Cough   Past Medical History:  Diagnosis Date   Allergy 2015   Tetanus   Carotid artery stenosis    Colon polyps    Diverticulosis    Hyperlipidemia    Hypertension    Kidney stones    Osteopenia    T-2.3   Right renal artery stenosis [facility])     Patient Active Problem List   Diagnosis Date Noted   Bilateral leg cramps 06/03/2024   Vitamin D  deficiency 02/25/2023   Right renal artery stenosis (HCC)    Osteopenia    Carotid artery stenosis 01/03/2014   Hypertension    Hyperlipidemia     Past Surgical History:  Procedure Laterality Date   ABDOMINAL HYSTERECTOMY  12/22/1980   Partial hysterectomy   COLONOSCOPY N/A 07/30/2016   Procedure: COLONOSCOPY;  Surgeon: Lamar CHRISTELLA Hollingshead, MD;  [facility]: [facility];   Service: Endoscopy;  Laterality: N/A;  8:30 AM   FLEXIBLE SIGMOIDOSCOPY  11/28/2021   Procedure: FLEXIBLE SIGMOIDOSCOPY;  Surgeon: Cindie Carlin POUR, DO;  Location: [facility];  Service: Endoscopy;;   LEFT HEART CATH AND CORONARY ANGIOGRAPHY N/A 01/09/2020   Procedure: LEFT HEART CATH AND CORONARY ANGIOGRAPHY;  Surgeon: Mady Bruckner, MD;  [facility];  Service: Cardiovascular;  Laterality: N/A;   POLYPECTOMY  07/30/2016   Procedure: POLYPECTOMY;  Surgeon: Lamar CHRISTELLA Hollingshead, MD;  [facility]: [facility];  Service: Endoscopy;;  descending colon   SMALL INTESTINE SURGERY      OB History   No obstetric history on file.      Home Medications    Prior to Admission medications   Medication Sig Start Date End Date Taking? Authorizing Provider  cetirizine  (ZYRTEC ) 5 MG tablet Take 1 tablet (5 mg total) by mouth daily. 07/05/24  Yes Enedelia Dorna CHRISTELLA, FNP  amLODipine  (NORVASC ) 10 MG tablet Take 1 tablet (10 mg total) by mouth daily. 06/03/24   Melvenia Manus BRAVO, MD  aspirin  EC 81 MG tablet Take 81 mg by mouth daily.    [provider]  Bempedoic Acid  (NEXLETOL ) 180 MG TABS Take 1 tablet (180 mg total) by mouth at bedtime. 10/12/23   Delford Maude [name], MD  cholecalciferol  (VITAMIN D ) 1000 UNITS tablet Take 1,000 Units by mouth daily.    [provider]  fluticasone  (FLONASE ) 50 MCG/ACT nasal spray  Place 2 sprays into both nostrils daily. 10/16/23   Melvenia Manus BRAVO, MD  hydrochlorothiazide  (HYDRODIURIL ) 25 MG tablet Take 1 tablet (25 mg total) by mouth daily. 10/16/23   Melvenia Manus BRAVO, MD  losartan  (COZAAR ) 100 MG tablet Take 1 tablet (100 mg total) by mouth daily. 10/16/23   Melvenia Manus BRAVO, MD    Family History Family History  Problem Relation Age of Onset   Cancer Mother        pancratic   Heart attack Mother    Cancer Father        stomach   Diabetes Father    Hyperlipidemia Father    Heart disease Father        NOT  before age 61   Cancer  Sister        lung   Hyperlipidemia Sister    Cancer Brother        thyroid , prostate   Hyperlipidemia Brother    Heart attack Brother    Cancer Sister        colon   Hyperlipidemia Sister    Cancer Sister        colon   Hyperlipidemia Sister     Social History Social History   Tobacco Use   Smoking status: Former    Current packs/day: 0.00    Average packs/day: 0.5 packs/day for 23.0 years (11.5 ttl pk-yrs)    Types: Cigarettes    Start date: 12/22/1973    Quit date: 12/22/1996    Years since quitting: 27.5   Smokeless tobacco: Never  Vaping Use   Vaping status: Never Used  Substance Use Topics   Alcohol  use: No   Drug use: No     Allergies   Tetanus toxoids   Review of Systems Review of Systems  Respiratory:  Positive for cough.   Per HPI   Physical Exam Triage Vital Signs ED Triage Vitals  Encounter Vitals Group     BP 07/05/24 1105 (!) 168/73     Girls Systolic BP Percentile --      Girls Diastolic BP Percentile --      Boys Systolic BP Percentile --      Boys Diastolic BP Percentile --      Pulse Rate 07/05/24 1105 80     Resp 07/05/24 1105 16     Temp 07/05/24 1105 97.8 F (36.6 C)     Temp Source 07/05/24 1105 Oral     SpO2 07/05/24 1105 95 %     Weight --      Height --      Head Circumference --      Peak Flow --      Pain Score 07/05/24 1106 3     Pain Loc --      Pain Education --      Exclude from Growth Chart --    No data found.  Updated Vital Signs BP (!) 168/73 (BP Location: Right Arm)   Pulse 80   Temp 97.8 F (36.6 C) (Oral)   Resp 16   SpO2 95%   Visual Acuity Right Eye Distance:   Left Eye Distance:   Bilateral Distance:    Right Eye Near:   Left Eye Near:    Bilateral Near:     Physical Exam Vitals and nursing note reviewed.  Constitutional:      Appearance: She is not ill-appearing or toxic-appearing.  HENT:     Head: Normocephalic and atraumatic.     Right  Ear: Hearing, ear canal and external ear normal.  No decreased hearing noted. No drainage, swelling or tenderness. A middle ear effusion is present. There is no impacted cerumen. Tympanic membrane is not injected, perforated, erythematous, retracted or bulging.     Left Ear: Hearing, ear canal and external ear normal. No drainage, swelling or tenderness. A middle ear effusion is present. There is no impacted cerumen. Tympanic membrane is bulging. Tympanic membrane is not injected, perforated, erythematous or retracted.     Nose: Congestion present.     Mouth/Throat:     Lips: Pink.     Mouth: Mucous membranes are moist. No injury or oral lesions.     Dentition: Normal dentition.     Tongue: No lesions.     Pharynx: Oropharynx is clear. Uvula midline. No pharyngeal swelling, oropharyngeal exudate, posterior oropharyngeal erythema, uvula swelling or postnasal drip.     Tonsils: No tonsillar exudate.  Eyes:     General: Lids are normal. Vision grossly intact. Gaze aligned appropriately.     Extraocular Movements: Extraocular movements intact.     Conjunctiva/sclera: Conjunctivae normal.  Neck:     Trachea: Trachea and phonation normal.  Cardiovascular:     Rate and Rhythm: Normal rate and regular rhythm.     Heart sounds: Normal heart sounds, S1 normal and S2 normal.  Pulmonary:     Effort: Pulmonary effort is normal. No respiratory distress.     Breath sounds: Normal breath sounds and air entry. No decreased breath sounds, wheezing, rhonchi or rales.     Comments: Speaking in full sentences without difficulty or increased respiratory effort.  Musculoskeletal:     Cervical back: Neck supple.  Lymphadenopathy:     Cervical: No cervical adenopathy.  Skin:    General: Skin is warm and dry.     Capillary Refill: Capillary refill takes less than 2 seconds.     Findings: No rash.  Neurological:     General: No focal deficit present.     Mental Status: She is alert and oriented to person, place, and time. Mental status is at baseline.      GCS: GCS eye subscore is 4. GCS verbal subscore is 5. GCS motor subscore is 6.     Cranial Nerves: Cranial nerves 2-12 are intact. No dysarthria or facial asymmetry.     Sensory: Sensation is intact.     Motor: Motor function is intact. No weakness, tremor, abnormal muscle tone or pronator drift.     Coordination: Coordination is intact. Romberg sign negative. Coordination normal. Finger-Nose-Finger Test normal.     Gait: Gait is intact.     Comments: Strength and sensation intact to bilateral upper and lower extremities (5/5). Moves all 4 extremities with normal coordination voluntarily. Non-focal neuro exam.   Psychiatric:        Mood and Affect: Mood normal.        Speech: Speech normal.        Behavior: Behavior normal.        Thought Content: Thought content normal.        Judgment: Judgment normal.      UC Treatments / Results  Labs (all labs ordered are listed, but only abnormal results are displayed) Labs Reviewed  POC SARS CORONAVIRUS 2 AG -  ED    EKG   Radiology No results found.  Procedures Procedures (including critical care time)  Medications Ordered in UC Medications - No data to display  Initial Impression / Assessment and Plan /  UC Course  I have reviewed the triage vital signs and the nursing notes.  Pertinent labs & imaging results that were available during my care of the patient were reviewed by me and considered in my medical decision making (see chart for details).   1. Viral URI with cough Suspect viral URI, viral syndrome.  Strep/viral testing: POC COVID is negative.  Intermittent and transient dizziness last night likely secondary to mild dehydration.  She appears well-hydrated on exam today.  Physical exam findings reassuring, vital signs hemodynamically stable, and lungs clear, therefore deferred imaging of the chest.  Advised supportive care/prescriptions for symptomatic relief as outlined in AVS.    2.  Dysfunction of both eustachian  tubes Presentation consistent with eustachian tube dysfunction.  No signs of otitis media, TMJ, injury.  Flonase  and oral antihistamine recommended. Tylenol  as needed for pain.   Counseled patient on potential for adverse effects with medications prescribed/recommended today, strict ER and return-to-clinic precautions discussed, patient verbalized understanding.    Final Clinical Impressions(s) / UC Diagnoses   Final diagnoses:  Viral URI with cough  Dysfunction of both eustachian tubes     Discharge Instructions      You have a viral illness which will improve on its own with rest, fluids, and medications to help with your symptoms.  Tylenol , guaifenesin -DM (plain mucinex ), and saline nasal sprays may help relieve symptoms.   Flonase  2 puffs into each nostril daily to relieve nasal pressure.  Zyrtec  5mg  tablet once daily to clear up fluid behind ear drums.   Two teaspoons of honey in 1 cup of warm water  every 4-6 hours may help with throat pains.  Humidifier in room at nighttime may help soothe cough (clean well daily).   For chest pain, shortness of breath, inability to keep food or fluids down without vomiting, fever that does not respond to tylenol  or motrin, or any other severe symptoms, please go to the ER for further evaluation. Return to urgent care as needed, otherwise follow-up with PCP.       ED Prescriptions     Medication Sig Dispense Auth. Provider   cetirizine  (ZYRTEC ) 5 MG tablet Take 1 tablet (5 mg total) by mouth daily. 20 tablet Enedelia Dorna HERO, FNP      PDMP not reviewed this encounter.   Enedelia Dorna HERO, [place] 07/05/24 1142

## 2024-07-11 MED ORDER — ROSUVASTATIN CALCIUM 20 MG PO TABS: 20.0000 mg | ORAL_TABLET | Freq: Every day | ORAL | 3 refills | Status: DC

## 2024-07-11 NOTE — Addendum Note (Signed)
 Addended by: ANDREZ PRAIRIE on: 07/11/2024 08:12 AM   Modules accepted: Orders

## 2024-07-14 ENCOUNTER — Other Ambulatory Visit: Payer: Self-pay

## 2024-07-17 ENCOUNTER — Other Ambulatory Visit: Payer: Self-pay | Admitting: Internal Medicine

## 2024-07-21 ENCOUNTER — Other Ambulatory Visit (HOSPITAL_COMMUNITY): Payer: Self-pay

## 2024-07-21 DIAGNOSIS — Z1231 Encounter for screening mammogram for malignant neoplasm of breast: Secondary | ICD-10-CM

## 2024-07-22 ENCOUNTER — Ambulatory Visit (HOSPITAL_COMMUNITY)
Admission: RE | Admit: 2024-07-22 | Discharge: 2024-07-22 | Disposition: A | Discharge status: Home / Self Care | Source: Ambulatory Visit | Attending: Internal Medicine | Admitting: Internal Medicine

## 2024-07-22 DIAGNOSIS — Z1231 Encounter for screening mammogram for malignant neoplasm of breast: Secondary | ICD-10-CM | POA: Diagnosis not present

## 2024-07-25 ENCOUNTER — Other Ambulatory Visit: Payer: Self-pay | Admitting: Internal Medicine

## 2024-08-14 ENCOUNTER — Ambulatory Visit: Payer: Self-pay

## 2024-08-17 ENCOUNTER — Ambulatory Visit
Admission: RE | Admit: 2024-08-17 | Discharge: 2024-08-17 | Disposition: A | Discharge status: Home / Self Care | Payer: Self-pay | Attending: Family Medicine | Admitting: Family Medicine

## 2024-08-17 ENCOUNTER — Ambulatory Visit (INDEPENDENT_AMBULATORY_CARE_PROVIDER_SITE_OTHER)

## 2024-08-17 VITALS — BP 156/62 | HR 76 | Temp 97.4°F | Resp 20

## 2024-08-17 DIAGNOSIS — M25571 Pain in right ankle and joints of right foot: Secondary | ICD-10-CM | POA: Diagnosis not present

## 2024-08-17 DIAGNOSIS — M7989 Other specified soft tissue disorders: Secondary | ICD-10-CM | POA: Diagnosis not present

## 2024-08-17 MED ORDER — PREDNISONE 20 MG PO TABS: 40.0000 mg | ORAL_TABLET | Freq: Every day | ORAL | 0 refills | Status: DC

## 2024-08-17 NOTE — Discharge Instructions (Signed)
 We will let you know once your x-ray comes back and discuss next steps from there.

## 2024-08-17 NOTE — ED Triage Notes (Signed)
 Pt reports right ankle pain and swelling denies injury, pain started on Monday, became worse yesterday. Has attempted to get I with podiatry but was unable to get an appointment until Sept 8th.

## 2024-08-18 ENCOUNTER — Ambulatory Visit: Payer: Self-pay | Admitting: Family Medicine

## 2024-08-18 NOTE — ED Provider Notes (Signed)
 [facility]    CSN: [id number] Arrival date & time: 08/17/24  1151      History   Chief Complaint Chief Complaint  Patient presents with   Ankle Pain    Pain and swelling in right ankle. - Entered by patient    HPI Stacey Moody is a 78 y.o. female.   Patient presenting today with several day history of right lateral ankle pain and swelling.  Denies bruising, redness, warmth, numbness, tingling, loss of range of motion, injury to the area.  So far trying ice and Epsom salt soaks with mild temporary benefit.  Tried to get into podiatry but was unable to get an appointment until September 8.    Past Medical History:  Diagnosis Date   Allergy 2015   Tetanus   Carotid artery stenosis    Colon polyps    Diverticulosis    Hyperlipidemia    Hypertension    Kidney stones    Osteopenia    T-2.3   Right renal artery stenosis [facility])     Patient Active Problem List   Diagnosis Date Noted   Bilateral leg cramps 06/03/2024   Vitamin D  deficiency 02/25/2023   Right renal artery stenosis (HCC)    Osteopenia    Carotid artery stenosis 01/03/2014   Hypertension    Hyperlipidemia     Past Surgical History:  Procedure Laterality Date   ABDOMINAL HYSTERECTOMY  12/22/1980   Partial hysterectomy   COLONOSCOPY N/A 07/30/2016   Procedure: COLONOSCOPY;  Surgeon: Lamar CHRISTELLA Hollingshead, MD;  [facility];  Service: Endoscopy;  Laterality: N/A;  8:30 AM   FLEXIBLE SIGMOIDOSCOPY  11/28/2021   Procedure: FLEXIBLE SIGMOIDOSCOPY;  Surgeon: Cindie Carlin POUR, DO;  [facility];  Service: Endoscopy;;   LEFT HEART CATH AND CORONARY ANGIOGRAPHY N/A 01/09/2020   Procedure: LEFT HEART CATH AND CORONARY ANGIOGRAPHY;  Surgeon: Mady Bruckner, MD;  [facility];  Service: Cardiovascular;  Laterality: N/A;   POLYPECTOMY  07/30/2016   Procedure: POLYPECTOMY;  Surgeon: Lamar CHRISTELLA Hollingshead, MD;  [facility];  Service: Endoscopy;;  descending colon    SMALL INTESTINE SURGERY      OB History   No obstetric history on file.      Home Medications    Prior to Admission medications   Medication Sig Start Date End Date Taking? Authorizing Provider  predniSONE  (DELTASONE ) 20 MG tablet Take 2 tablets (40 mg total) by mouth daily with breakfast. 08/17/24  Yes Stuart Vernell Norris, PA-C  amLODipine  (NORVASC ) 10 MG tablet Take 1 tablet (10 mg total) by mouth daily. 06/03/24   Melvenia Manus BRAVO, MD  aspirin  EC 81 MG tablet Take 81 mg by mouth daily.    [provider]  Bempedoic Acid  (NEXLETOL ) 180 MG TABS Take 1 tablet (180 mg total) by mouth at bedtime. 10/12/23   Delford Maude [name], MD  cetirizine  (ZYRTEC ) 5 MG tablet Take 1 tablet (5 mg total) by mouth daily. 07/05/24   Enedelia Dorna CHRISTELLA, FNP  cholecalciferol  (VITAMIN D ) 1000 UNITS tablet Take 1,000 Units by mouth daily.    [provider]  fluticasone  (FLONASE ) 50 MCG/ACT nasal spray Place 2 sprays into both nostrils daily. 10/16/23   Melvenia Manus BRAVO, MD  hydrochlorothiazide  (HYDRODIURIL ) 25 MG tablet TAKE 1 TABLET(25 MG) BY MOUTH DAILY 07/18/24   Bevely Doffing, FNP  losartan  (COZAAR ) 100 MG tablet TAKE 1 TABLET(100 MG) BY MOUTH DAILY 07/18/24   Bevely Doffing, FNP  rosuvastatin  (CRESTOR ) 20 MG tablet Take 1 tablet (20 mg total) by mouth daily. 07/11/24   Hilty, Vinie [name], MD    Family History Family History  Problem Relation Age of Onset   Cancer Mother        pancratic   Heart attack Mother    Cancer Father        stomach   Diabetes Father    Hyperlipidemia Father    Heart disease Father        NOT  before age 72   Cancer Sister        lung   Hyperlipidemia Sister    Cancer Brother        thyroid , prostate   Hyperlipidemia Brother    Heart attack Brother    Cancer Sister        colon   Hyperlipidemia Sister    Cancer Sister        colon   Hyperlipidemia Sister     Social History Social History   Tobacco Use   Smoking status: Former    Current  packs/day: 0.00    Average packs/day: 0.5 packs/day for 23.0 years (11.5 ttl pk-yrs)    Types: Cigarettes    Start date: 12/22/1973    Quit date: 12/22/1996    Years since quitting: 27.6   Smokeless tobacco: Never  Vaping Use   Vaping status: Never Used  Substance Use Topics   Alcohol  use: No   Drug use: No     Allergies   Tetanus toxoids   Review of Systems Review of Systems Per HPI  Physical Exam Triage Vital Signs ED Triage Vitals  Encounter Vitals Group     BP 08/17/24 1156 (!) 156/62     Girls Systolic BP Percentile --      Girls Diastolic BP Percentile --      Boys Systolic BP Percentile --      Boys Diastolic BP Percentile --      Pulse Rate 08/17/24 1156 76     Resp 08/17/24 1156 20     Temp 08/17/24 1156 (!) 97.4 F (36.3 C)     Temp Source 08/17/24 1156 Oral     SpO2 08/17/24 1156 94 %     Weight --      Height --      Head Circumference --      Peak Flow --      Pain Score 08/17/24 1158 5     Pain Loc --      Pain Education --      Exclude from Growth Chart --    No data found.  Updated Vital Signs BP (!) 156/62 (BP Location: Right Arm)   Pulse 76   Temp (!) 97.4 F (36.3 C) (Oral)   Resp 20   SpO2 94%   Visual Acuity Right Eye Distance:   Left Eye Distance:   Bilateral Distance:    Right Eye Near:   Left Eye Near:    Bilateral Near:     Physical Exam Vitals and nursing note reviewed.  Constitutional:      Appearance: Normal appearance. She is not ill-appearing.  HENT:     Head: Atraumatic.  Eyes:     Extraocular Movements: Extraocular movements intact.     Conjunctiva/sclera: Conjunctivae normal.  Cardiovascular:     Rate and Rhythm: Normal rate.  Pulmonary:     Effort: Pulmonary effort is normal.  Musculoskeletal:        General: Swelling  and tenderness present. No deformity or signs of injury. Normal range of motion.     Cervical back: Normal range of motion and neck supple.     Comments: Localized area of edema, tenderness  to palpation just behind the lateral malleolus of the right ankle.  Achilles nontender, nonedematous.  Range of motion intact  Skin:    General: Skin is warm and dry.     Findings: No bruising or erythema.  Neurological:     Mental Status: She is alert and oriented to person, place, and time.     Motor: No weakness.     Gait: Gait normal.     Comments: Bilateral lower extremities neurovascularly intact  Psychiatric:        Mood and Affect: Mood normal.        Thought Content: Thought content normal.        Judgment: Judgment normal.      UC Treatments / Results  Labs (all labs ordered are listed, but only abnormal results are displayed) Labs Reviewed - No data to display  EKG   Radiology DG Ankle Complete Right Result Date: 08/17/2024 EXAM: 3 or more VIEW(S) XRAY OF THE RIGHT ANKLE 08/17/2024 12:36:35 PM CLINICAL HISTORY: Right lateral ankle pain and swelling, no known injury. COMPARISON: None available. FINDINGS: BONES AND JOINTS: No acute fracture. No focal osseous lesion. No joint dislocation. SOFT TISSUES: The soft tissues are unremarkable. Peripheral vascular calcifications. IMPRESSION: 1. No acute osseous abnormality. Electronically signed by: Waddell Calk MD 08/17/2024 01:16 PM EDT RP Workstation: [id number]    Procedures Procedures (including critical care time)  Medications Ordered in UC Medications - No data to display  Initial Impression / Assessment and Plan / UC Course  I have reviewed the triage vital signs and the nursing notes.  Pertinent labs & imaging results that were available during my care of the patient were reviewed by me and considered in my medical decision making (see chart for details).     Mildly hypertensive in triage, otherwise vital signs reassuring.  X-ray of the right ankle negative for acute bony abnormality.  Possibly a tendinitis or soft tissue strain.  Treat with prednisone , Epsom salt soaks, ice, elevation, rest and wear supportive  shoes.  Follow-up with podiatrist if not resolving.  Final Clinical Impressions(s) / UC Diagnoses   Final diagnoses:  Acute right ankle pain     Discharge Instructions      We will let you know once your x-ray comes back and discuss next steps from there.    ED Prescriptions     Medication Sig Dispense Auth. Provider   predniSONE  (DELTASONE ) 20 MG tablet Take 2 tablets (40 mg total) by mouth daily with breakfast. 10 tablet Stuart Vernell Norris, [place]      PDMP not reviewed this encounter.   Stuart Vernell [place], [place] 08/18/24 [phone]

## 2024-08-29 DIAGNOSIS — M25571 Pain in right ankle and joints of right foot: Secondary | ICD-10-CM | POA: Diagnosis not present

## 2024-08-29 DIAGNOSIS — M7671 Peroneal tendinitis, right leg: Secondary | ICD-10-CM | POA: Diagnosis not present

## 2024-09-01 ENCOUNTER — Other Ambulatory Visit: Payer: Self-pay

## 2024-09-01 ENCOUNTER — Encounter: Payer: Self-pay | Admitting: Emergency Medicine

## 2024-09-01 ENCOUNTER — Ambulatory Visit
Admission: EM | Admit: 2024-09-01 | Discharge: 2024-09-01 | Disposition: A | Discharge status: Home / Self Care | Attending: Family Medicine | Admitting: Family Medicine

## 2024-09-01 DIAGNOSIS — T148XXA Other injury of unspecified body region, initial encounter: Secondary | ICD-10-CM

## 2024-09-01 MED ORDER — MUPIROCIN 2 % EX OINT
1.0000 | TOPICAL_OINTMENT | Freq: Two times a day (BID) | CUTANEOUS | 0 refills | Status: DC
Start: 2024-09-01 — End: 2024-10-11

## 2024-09-01 NOTE — ED Provider Notes (Signed)
 [facility]   [id number] 09/01/24 Arrival Time: 9197  ASSESSMENT & PLAN:  1. Avulsion of skin     Meds ordered this encounter  Medications   mupirocin  ointment (BACTROBAN ) 2 %    Sig: Apply 1 Application topically 2 (two) times daily.    Dispense:  22 g    Refill:  0   Bandaged by RN.  Follow-up Information     [facility].   Specialty: Urgent Care Why: If worsening or failing to improve as anticipated. Contact information: [address], [address] [place]  [address] [phone]        Bevely Doffing, FNP.   Specialty: Family Medicine Why: As needed. Contact information: [address] [place] [address] [phone]                  Will follow up with PCP or here if worsening or failing to improve as anticipated. Reviewed expectations re: course of current medical issues. Questions answered. Outlined signs and symptoms indicating need for more acute intervention. Patient verbalized understanding. After Visit Summary given.   SUBJECTIVE:  Stacey Moody is a 78 y.o. female who presents with a skin complaint. Pt reports peeled skin back on my arm after hitting it on screened door on Sunday. Pt reports pain and discomfort to right forearm started last night and reports I thought I saw a white place in it so I wanted to make sure its not infected. Has tried polysporin, ointments, and dressing.   No drainage, active bleeding from site in triage. Denies any known fevers.     OBJECTIVE: Vitals:   09/01/24 0811  BP: (!) 156/80  Pulse: 81  Resp: 20  Temp: 97.9 F (36.6 C)  TempSrc: Oral  SpO2: 97%    General appearance: alert; no distress Extremities: no edema; moves all extremities normally Skin: warm and dry; skin avulsion of R forearm; no bleeding/drainage; no signs of active infection Psychological: alert and cooperative; normal mood and affect  Allergies  Allergen  Reactions   Tetanus Toxoid-Containing Vaccines Swelling    Past Medical History:  Diagnosis Date   Allergy 2015   Tetanus   Carotid artery stenosis    Colon polyps    Diverticulosis    Hyperlipidemia    Hypertension    Kidney stones    Osteopenia    T-2.3   Right renal artery stenosis (HCC)    Social History   Socioeconomic History   Marital status: Widowed    Spouse name: Not on file   Number of children: 2   Years of education: Not on file   Highest education level: 12th grade  Occupational History   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 [employer]  Tobacco Use   Smoking status: Former    Current packs/day: 0.00    Average packs/day: 0.5 packs/day for 23.0 years (11.5 ttl pk-yrs)    Types: Cigarettes    Start date: 12/22/1973    Quit date: 12/22/1996    Years since quitting: 27.7   Smokeless tobacco: Never  Vaping Use   Vaping status: Never Used  Substance and Sexual Activity   Alcohol  use: No   Drug use: No   Sexual activity: Not Currently    Comment: Widow  Other Topics Concern   Not on file  Social History Narrative   Not on file   Social Drivers of Health   Financial Resource Strain: Low Risk  (06/15/2024)  Overall Financial Resource Strain (CARDIA)    Difficulty of Paying Living Expenses: Not hard at all  Food Insecurity: No Food Insecurity (06/15/2024)   Hunger Vital Sign    Worried About Running Out of Food in the Last Year: Never true    Ran Out of Food in the Last Year: Never true  Transportation Needs: No Transportation Needs (06/15/2024)   PRAPARE - Administrator, Civil Service (Medical): No    Lack of Transportation (Non-Medical): No  Physical Activity: Insufficiently Active (06/15/2024)   Exercise Vital Sign    Days of Exercise per Week: 2 days    Minutes of Exercise per Session: 30 min  Stress: No Stress Concern Present (06/15/2024)   [employer] of Occupational Health - Occupational Stress Questionnaire    Feeling of Stress: Not  at all  Social Connections: Socially Isolated (06/15/2024)   Social Connection and Isolation Panel    Frequency of Communication with Friends and Family: More than three times a week    Frequency of Social Gatherings with Friends and Family: Not on file    Attends Religious Services: Never    Active Member of Clubs or Organizations: No    Attends Banker Meetings: Never    Marital Status: Widowed  Intimate Partner Violence: Not At Risk (06/15/2024)   Humiliation, Afraid, Rape, and Kick questionnaire    Fear of Current or Ex-Partner: No    Emotionally Abused: No    Physically Abused: No    Sexually Abused: No   Family History  Problem Relation Age of Onset   Cancer Mother        pancratic   Heart attack Mother    Cancer Father        stomach   Diabetes Father    Hyperlipidemia Father    Heart disease Father        NOT  before age 17   Cancer Sister        lung   Hyperlipidemia Sister    Cancer Brother        thyroid , prostate   Hyperlipidemia Brother    Heart attack Brother    Cancer Sister        colon   Hyperlipidemia Sister    Cancer Sister        colon   Hyperlipidemia Sister    Past Surgical History:  Procedure Laterality Date   ABDOMINAL HYSTERECTOMY  12/22/1980   Partial hysterectomy   COLONOSCOPY N/A 07/30/2016   Procedure: COLONOSCOPY;  Surgeon: Lamar CHRISTELLA Hollingshead, MD;  [facility];  Service: Endoscopy;  Laterality: N/A;  8:30 AM   FLEXIBLE SIGMOIDOSCOPY  11/28/2021   Procedure: FLEXIBLE SIGMOIDOSCOPY;  Surgeon: Cindie Carlin POUR, DO;  Location: [facility];  Service: Endoscopy;;   LEFT HEART CATH AND CORONARY ANGIOGRAPHY N/A 01/09/2020   Procedure: LEFT HEART CATH AND CORONARY ANGIOGRAPHY;  Surgeon: Mady Bruckner, MD;  [facility];  Service: Cardiovascular;  Laterality: N/A;   POLYPECTOMY  07/30/2016   Procedure: POLYPECTOMY;  Surgeon: Lamar CHRISTELLA Hollingshead, MD;  [facility]: [facility];  Service: Endoscopy;;  descending  colon   SMALL INTESTINE SURGERY        Rolinda Rogue, MD 09/01/24 [phone]

## 2024-09-01 NOTE — ED Notes (Signed)
 Ointment, nonadherent dressing, secured with coban. Pt tolerated well.  Site management and infection prevention education reviewed. Pt verbalized understanding.

## 2024-09-01 NOTE — ED Triage Notes (Addendum)
 Pt reports peeled skin back on my arm after hitting it on screened door on Sunday. Pt reports pain and discomfort to right forearm started last night and reports I thought I saw a white place in it so I wanted to make sure its not infected. Has tried polysporin, ointments, and dressing.   No drainage, active bleeding from site in triage. Denies any known fevers.

## 2024-09-26 DIAGNOSIS — M7671 Peroneal tendinitis, right leg: Secondary | ICD-10-CM | POA: Diagnosis not present

## 2024-09-26 DIAGNOSIS — M25571 Pain in right ankle and joints of right foot: Secondary | ICD-10-CM | POA: Diagnosis not present

## 2024-10-11 ENCOUNTER — Ambulatory Visit

## 2024-10-11 VITALS — Ht 63.0 in | Wt 155.0 lb

## 2024-10-11 DIAGNOSIS — Z0000 Encounter for general adult medical examination without abnormal findings: Secondary | ICD-10-CM

## 2024-10-11 DIAGNOSIS — Z780 Asymptomatic menopausal state: Secondary | ICD-10-CM

## 2024-10-11 NOTE — Patient Instructions (Signed)
 Stacey Moody,  Thank you for taking the time for your Medicare Wellness Visit. I appreciate your continued commitment to your health goals. Please review the care plan we discussed, and feel free to reach out if I can assist you further.  Medicare recommends these wellness visits once per year to help you and your care team stay ahead of potential health issues. These visits are designed to focus on prevention, allowing your provider to concentrate on managing your acute and chronic conditions during your regular appointments.  Please note that Annual Wellness Visits do not include a physical exam. Some assessments may be limited, especially if the visit was conducted virtually. If needed, we may recommend a separate in-person follow-up with your provider.  Ongoing Care  Seeing your primary care provider every 3 to 6 months helps us  monitor your health and provide consistent, personalized care.   Referrals   Osteoporosis Screening An order was placed for you to have your Osteoporosis Screening. Call the number below to schedule that [facility]  [phone]   Recommended Screenings:  Health Maintenance  Topic Date Due   DEXA scan (bone density measurement)  07/04/2018   Flu Shot  07/22/2024   COVID-19 Vaccine (1) 03/01/2025*   Zoster (Shingles) Vaccine (1 of 2) 03/01/2025*   Breast Cancer Screening  07/22/2025   Colon Cancer Screening  09/01/2025   Medicare Annual Wellness Visit  10/11/2025   Pneumococcal Vaccine for age over 40  Completed   Hepatitis C Screening  Completed   Meningitis B Vaccine  Aged Out   DTaP/Tdap/Td vaccine  Discontinued  *Topic was postponed. The date shown is not the original due date.       10/11/2024    9:00 AM  Advanced Directives  Does Patient Have a Medical Advance Directive? No  Would patient like information on creating a medical advance directive? No - Patient declined    Advance Care Planning is important because it: Ensures you receive  medical care that aligns with your values, goals, and preferences. Provides guidance to your family and loved ones, reducing the emotional burden of decision-making during critical moments.  Vision: Annual vision screenings are recommended for early detection of glaucoma, cataracts, and diabetic retinopathy. These exams can also reveal signs of chronic conditions such as diabetes and high blood pressure.  Dental: Annual dental screenings help detect early signs of oral cancer, gum disease, and other conditions linked to overall health, including heart disease and diabetes.  Please see the attached documents for additional preventive care recommendations.

## 2024-10-11 NOTE — Progress Notes (Signed)
 Subjective:   Stacey Moody is a 78 y.o. who presents for a Medicare Wellness preventive visit.  As a reminder, Annual Wellness Visits don't include a physical exam, and some assessments may be limited, especially if this visit is performed virtually. We may recommend an in-person follow-up visit with your provider if needed.  Visit Complete: Virtual I connected with  Stacey Moody on 10/11/24 by a audio enabled telemedicine application and verified that I am speaking with the correct person using two identifiers.  Patient Location: Home  Provider Location: Home Office  I discussed the limitations of evaluation and management by telemedicine. The patient expressed understanding and agreed to proceed.  Vital Signs: Because this visit was a virtual/telehealth visit, some criteria may be missing or patient reported. Any vitals not documented were not able to be obtained and vitals that have been documented are patient reported.  VideoDeclined- This patient declined Librarian, academic. Therefore the visit was completed with audio only.  Persons Participating in Visit: Patient.  AWV Questionnaire: Yes: Patient Medicare AWV questionnaire was completed by the patient on 10/07/2024; I have confirmed that all information answered by patient is correct and no changes since this date.  Cardiac Risk Factors include: advanced age (>61men, >26 women);hypertension;dyslipidemia     Objective:    Today's Vitals   10/11/24 0842  Weight: 155 lb (70.3 kg)  Height: 5' 3 (1.6 m)   Body mass index is 27.46 kg/m.     10/11/2024    9:00 AM 06/30/2023    8:02 AM 04/10/2023    7:57 AM 02/04/2023    3:00 PM 11/28/2021   11:19 AM 11/27/2021    1:11 PM 09/23/2021   11:37 AM  Advanced Directives  Does Patient Have a Medical Advance Directive? No No Yes No Yes Yes Yes  Type of Surveyor, minerals;Living will  Healthcare Power of [place];Living will  Healthcare Power of [place];Living will Healthcare Power of [place];Living will  Copy of Healthcare Power of Attorney in Chart?      No - copy requested No - copy requested  Would patient like information on creating a medical advance directive? No - Patient declined No - Patient declined         Current Medications (verified) Outpatient Encounter Medications as of 10/11/2024  Medication Sig   amLODipine  (NORVASC ) 10 MG tablet Take 1 tablet (10 mg total) by mouth daily.   aspirin  EC 81 MG tablet Take 81 mg by mouth daily.   cetirizine  (ZYRTEC ) 5 MG tablet Take 1 tablet (5 mg total) by mouth daily.   cholecalciferol  (VITAMIN D ) 1000 UNITS tablet Take 1,000 Units by mouth daily.   fluticasone  (FLONASE ) 50 MCG/ACT nasal spray Place 2 sprays into both nostrils daily.   hydrochlorothiazide  (HYDRODIURIL ) 25 MG tablet TAKE 1 TABLET(25 MG) BY MOUTH DAILY   losartan  (COZAAR ) 100 MG tablet TAKE 1 TABLET(100 MG) BY MOUTH DAILY   rosuvastatin  (CRESTOR ) 20 MG tablet Take 1 tablet (20 mg total) by mouth daily.   [DISCONTINUED] mupirocin  ointment (BACTROBAN ) 2 % Apply 1 Application topically 2 (two) times daily.   No facility-administered encounter medications on file as of 10/11/2024.    Allergies (verified) Tetanus toxoid-containing vaccines   History: Past Medical History:  Diagnosis Date   Allergy 2015   Tetanus   Carotid artery stenosis    Colon polyps    Diverticulosis    Hyperlipidemia    Hypertension    Kidney  stones    Osteopenia    T-2.3   Right renal artery stenosis    Past Surgical History:  Procedure Laterality Date   ABDOMINAL HYSTERECTOMY  12/22/1980   Partial hysterectomy   CARDIAC CATHETERIZATION     COLONOSCOPY N/A 07/30/2016   Procedure: COLONOSCOPY;  Surgeon: Lamar CHRISTELLA Hollingshead, MD;  [facility];  Service: Endoscopy;  Laterality: N/A;  8:30 AM   FLEXIBLE SIGMOIDOSCOPY  11/28/2021   Procedure: FLEXIBLE SIGMOIDOSCOPY;  Surgeon: Cindie Carlin POUR, DO;   [facility];  Service: Endoscopy;;   LEFT HEART CATH AND CORONARY ANGIOGRAPHY N/A 01/09/2020   Procedure: LEFT HEART CATH AND CORONARY ANGIOGRAPHY;  Surgeon: Mady Bruckner, MD;  [facility];  Service: Cardiovascular;  Laterality: N/A;   POLYPECTOMY  07/30/2016   Procedure: POLYPECTOMY;  Surgeon: Lamar CHRISTELLA Hollingshead, MD;  [facility];  Service: Endoscopy;;  descending colon   SMALL INTESTINE SURGERY     Family History  Problem Relation Age of Onset   Cancer Mother        pancratic   Heart attack Mother    Cancer Father        stomach   Diabetes Father    Hyperlipidemia Father    Heart disease Father        NOT  before age 46   Cancer Sister        lung   Hyperlipidemia Sister    Cancer Brother        thyroid , prostate   Hyperlipidemia Brother    Heart attack Brother    Cancer Sister        colon   Hyperlipidemia Sister    Cancer Sister        colon   Hyperlipidemia Sister    Social History   Socioeconomic History   Marital status: Widowed    Spouse name: Not on file   Number of children: 2   Years of education: Not on file   Highest education level: 12th grade  Occupational History   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 crew [employer]  Tobacco Use   Smoking status: Former    Current packs/day: 0.00    Average packs/day: 0.5 packs/day for 23.0 years (11.5 ttl pk-yrs)    Types: Cigarettes    Start date: 12/22/1973    Quit date: 12/22/1996    Years since quitting: 27.8   Smokeless tobacco: Never  Vaping Use   Vaping status: Never Used  Substance and Sexual Activity   Alcohol  use: No   Drug use: No   Sexual activity: Not Currently    Comment: Widow  Other Topics Concern   Not on file  Social History Narrative   Not on file   Social Drivers of Health   Financial Resource Strain: Low Risk  (10/07/2024)   Overall Financial Resource Strain (CARDIA)    Difficulty of Paying Living Expenses: Not hard at all  Food Insecurity: No Food  Insecurity (10/07/2024)   Hunger Vital Sign    Worried About Running Out of Food in the Last Year: Never true    Ran Out of Food in the Last Year: Never true  Transportation Needs: No Transportation Needs (10/07/2024)   PRAPARE - Administrator, Civil Service (Medical): No    Lack of Transportation (Non-Medical): No  Physical Activity: Insufficiently Active (10/07/2024)   Exercise Vital Sign    Days of Exercise per Week: 3 days    Minutes of Exercise per  Session: 30 min  Stress: No Stress Concern Present (10/07/2024)   [employer] of Occupational Health - Occupational Stress Questionnaire    Feeling of Stress: Not at all  Social Connections: Moderately Isolated (10/07/2024)   Social Connection and Isolation Panel    Frequency of Communication with Friends and Family: More than three times a week    Frequency of Social Gatherings with Friends and Family: Once a week    Attends Religious Services: More than 4 times per year    Active Member of [employer] or Organizations: No    Attends Banker Meetings: Never    Marital Status: Widowed    Tobacco Counseling Counseling given: Yes    Clinical Intake:  Pre-visit preparation completed: Yes  Pain : No/denies pain     BMI - recorded: 27.46 Nutritional Status: BMI 25 -29 Overweight Nutritional Risks: None Diabetes: No  Lab Results  Component Value Date   HGBA1C 6.1 (H) 06/03/2024   HGBA1C 6.2 (H) 05/29/2023   HGBA1C 6.3 (H) 01/07/2020     How often do you need to have someone help you when you read instructions, pamphlets, or other written materials from your doctor or pharmacy?: 1 - Never  Interpreter Needed?: No  Information entered by :: Dacian Orrico W CMA (AAMA)   Activities of Daily Living     10/07/2024    9:13 AM  In your present state of health, do you have any difficulty performing the following activities:  Hearing? 0  Vision? 0  Difficulty concentrating or making decisions? 0   Walking or climbing stairs? 0  Dressing or bathing? 0  Doing errands, shopping? 0  Preparing Food and eating ? N  Using the Toilet? N  In the past six months, have you accidently leaked urine? N  Do you have problems with loss of bowel control? N  Managing your Medications? N  Managing your Finances? N  Housekeeping or managing your Housekeeping? N    Patient Care Team: Bevely Doffing, FNP as PCP - General (Family Medicine) Delford Maude [name], MD as Consulting Physician (Cardiology) [facility])  I have updated your Care Teams any recent Medical Services you may have received from other providers in the past year.     Assessment:   This is a routine wellness examination for Stacey Moody.  Hearing/Vision screen Hearing Screening - Comments:: Patient denies any hearing difficulties.   Vision Screening - Comments:: Wears rx glasses - up to date with routine eye exams with  [facility]    Goals Addressed               This Visit's Progress     Remain healthy and active (pt-stated)          Depression Screen     10/11/2024    9:02 AM 06/15/2024   11:23 AM 06/03/2024    8:00 AM 03/01/2024    3:44 PM 12/04/2023    8:00 AM 06/30/2023    8:12 AM 05/29/2023    8:27 AM  PHQ 2/9 Scores  PHQ - 2 Score 0 0 0 0 0 0 0  PHQ- 9 Score 0 1 0 0 0 0 0     Fall Risk     10/07/2024    9:13 AM 06/03/2024    8:00 AM 03/01/2024    3:44 PM 12/04/2023    8:00 AM 06/30/2023    8:11 AM  Fall Risk   Falls in the past year? 0 0 0  0 0  Number falls in past yr: 0 0  0 0  Injury with Fall? 0 0  0 0  Risk for fall due to : No Fall Risks No Fall Risks  No Fall Risks No Fall Risks  Follow up Falls evaluation completed;Education provided;Falls prevention discussed Falls evaluation completed  Falls evaluation completed Falls prevention discussed    MEDICARE RISK AT HOME:  Medicare Risk at Home Any stairs in or around the home?: (Patient-Rptd) No Home free of loose  throw rugs in walkways, pet beds, electrical cords, etc?: (Patient-Rptd) Yes Adequate lighting in your home to reduce risk of falls?: (Patient-Rptd) Yes Life alert?: (Patient-Rptd) No Use of a cane, walker or w/c?: (Patient-Rptd) No Grab bars in the bathroom?: (Patient-Rptd) No Elevated toilet seat or a handicapped toilet?: (Patient-Rptd) No  TIMED UP AND GO:  Was the test performed?  No  Cognitive Function: 6CIT completed        10/11/2024    9:02 AM 06/30/2023    8:10 AM  6CIT Screen  What Year? 0 points 0 points  What month? 0 points 0 points  What time? 0 points 0 points  Count back from 20 0 points 0 points  Months in reverse 0 points 0 points  Repeat phrase 0 points 0 points  Total Score 0 points 0 points    Immunizations Immunization History  Administered Date(s) Administered   Influenza,inj,Quad PF,6+ Mos 12/31/2014, 10/01/2018   Influenza-Unspecified 11/13/2017   Pneumococcal Conjugate-13 03/15/2015   Pneumococcal Polysaccharide-23 04/29/2011   Td 03/16/2008   Tdap 03/16/2008    Screening Tests Health Maintenance  Topic Date Due   DEXA SCAN  07/04/2018   Influenza Vaccine  07/22/2024   COVID-19 Vaccine (1) 03/01/2025 (Originally 07/30/1951)   Zoster Vaccines- Shingrix (1 of 2) 03/01/2025 (Originally [birth year]30/1965)   Mammogram  07/22/2025   Colonoscopy  09/01/2025   Medicare Annual Wellness (AWV)  10/11/2025   Pneumococcal Vaccine: 50+ Years  Completed   Hepatitis C Screening  Completed   Meningococcal B Vaccine  Aged Out   DTaP/Tdap/Td  Discontinued    Health Maintenance Health Maintenance Due  Topic Date Due   DEXA SCAN  07/04/2018   Influenza Vaccine  07/22/2024   Health Maintenance Items Addressed: DEXA ordered  Additional Screening:  Vision Screening: Recommended annual ophthalmology exams for early detection of glaucoma and other disorders of the eye. Would you like a referral to an eye doctor? No    Dental Screening: Recommended annual dental  exams for proper oral hygiene  Community Resource Referral / Chronic Care Management: CRR required this visit?  No   CCM required this visit?  No   Plan:    I have personally reviewed and noted the following in the patient's chart:   Medical and social history Use of alcohol , tobacco or illicit drugs  Current medications and supplements including opioid prescriptions. Patient is not currently taking opioid prescriptions. Functional ability and status Nutritional status Physical activity Advanced directives List of other physicians Hospitalizations, surgeries, and ER visits in previous 12 months Vitals Screenings to include cognitive, depression, and falls Referrals and appointments  In addition, I have reviewed and discussed with patient certain preventive protocols, quality metrics, and best practice recommendations. A written personalized care plan for preventive services as well as general preventive health recommendations were provided to patient.   Emeric Novinger, CMA   10/11/2024   After Visit Summary: (MyChart) Due to this being a telephonic visit, the after visit summary with  patients personalized plan was offered to patient via MyChart   Notes: Nothing significant to report at this time.

## 2024-10-14 ENCOUNTER — Encounter (HOSPITAL_BASED_OUTPATIENT_CLINIC_OR_DEPARTMENT_OTHER): Payer: Self-pay | Admitting: Internal Medicine

## 2024-10-14 ENCOUNTER — Other Ambulatory Visit (HOSPITAL_BASED_OUTPATIENT_CLINIC_OR_DEPARTMENT_OTHER): Payer: Self-pay | Admitting: Family

## 2024-10-14 DIAGNOSIS — E785 Hyperlipidemia, unspecified: Secondary | ICD-10-CM

## 2024-10-15 LAB — NMR, LIPOPROFILE
Cholesterol, Total: 248 mg/dL — ABNORMAL HIGH (ref 100–199)
HDL Particle Number: 37.2 umol/L (ref >=30.5)
HDL-C: 65 mg/dL (ref >39)
LDL Particle Number: 1804 nmol/L — ABNORMAL HIGH (ref <1000)
LDL Size: 21.1 nm (ref >20.5)
LDL-C (NIH Calc): 155 mg/dL — ABNORMAL HIGH (ref 0–99)
LP-IR Score: 49 — ABNORMAL HIGH (ref <=45)
Small LDL Particle Number: 772 nmol/L — ABNORMAL HIGH (ref <=527)
Triglycerides: 157 mg/dL — ABNORMAL HIGH (ref 0–149)

## 2024-10-16 ENCOUNTER — Ambulatory Visit: Payer: Self-pay | Admitting: Internal Medicine

## 2024-10-16 DIAGNOSIS — E785 Hyperlipidemia, unspecified: Secondary | ICD-10-CM

## 2024-10-18 ENCOUNTER — Telehealth: Payer: Self-pay

## 2024-10-18 ENCOUNTER — Other Ambulatory Visit (HOSPITAL_COMMUNITY): Payer: Self-pay

## 2024-10-18 NOTE — Telephone Encounter (Signed)
 Pt has been enrolled in [employer] for Repatha, new cost per test claim is $0.00.

## 2024-10-18 NOTE — Telephone Encounter (Signed)
  PAP: Patient assistance application for Repatha has been approved by PAP Companies: [employer] from 09/18/2024 to 09/17/2025 .

## 2024-10-19 ENCOUNTER — Ambulatory Visit
Admission: RE | Admit: 2024-10-19 | Discharge: 2024-10-19 | Disposition: A | Discharge status: Home / Self Care | Source: Ambulatory Visit | Attending: Nurse Practitioner | Admitting: Nurse Practitioner

## 2024-10-19 ENCOUNTER — Other Ambulatory Visit: Payer: Self-pay

## 2024-10-19 VITALS — BP 116/71 | HR 71 | Temp 98.4°F | Resp 20

## 2024-10-19 DIAGNOSIS — J069 Acute upper respiratory infection, unspecified: Secondary | ICD-10-CM | POA: Diagnosis not present

## 2024-10-19 MED ORDER — REPATHA SURECLICK 140 MG/ML ~~LOC~~ SOAJ: 140.0000 mg | SUBCUTANEOUS | 3 refills | Status: AC

## 2024-10-19 MED ORDER — PSEUDOEPH-BROMPHEN-DM 30-2-10 MG/5ML PO SYRP: 5.0000 mL | ORAL_SOLUTION | Freq: Every evening | ORAL | 0 refills | Status: DC | PRN

## 2024-10-19 NOTE — Discharge Instructions (Addendum)
 Take medication as prescribed.  As discussed, the medication may cause drowsiness.  Make sure you are taking it only at bedtime. Continue Flonase  and Zyrtec  daily. Recommend over-the-counter Coricidin or Robitussin during the daytime to help with your cough. You may take over-the-counter Tylenol  as needed for pain, fever, or general discomfort. Recommend normal saline nasal spray for nasal congestion and runny nose. For your cough, recommend use of a humidifier in your bedroom at nighttime during sleep and sleeping elevated on pillows while cough symptoms persist. Your symptoms should begin to improve over the next 5 to 7 days.  If symptoms fail to improve, or begin to worsen, you may follow-up in this clinic or with your primary care physician for further evaluation. Follow-up as needed.

## 2024-10-19 NOTE — ED Triage Notes (Addendum)
 Pt reports cough, congestion, right ear pain, voice hoarseness, intermittent headache since Monday. Pt has been taking mucinex  and otc cough medication.denies any known fever.took home covid test on Monday. Pt declined additional viral testing in triage.

## 2024-10-19 NOTE — ED Provider Notes (Signed)
 [facility]    CSN: [id number] Arrival date & time: 10/19/24  1401      History   Chief Complaint Chief Complaint  Patient presents with   Cough    HPI Stacey Moody is a 78 y.o. female.   The history is provided by the patient.   Patient presents with a 2-day history of cough, nasal congestion, right ear pain, hoarseness, and headache.  Patient denies fever, chills, runny nose, wheezing, difficulty breathing, chest pain, abdominal pain, nausea, vomiting, diarrhea, rash. Patient states the cough is keeping her awake at night.  Patient states she has been taking over-the-counter cough and cold medications for her symptoms with minimal relief.  Patient denies any obvious close sick contacts.  Past Medical History:  Diagnosis Date   Allergy 2015   Tetanus   Carotid artery stenosis    Colon polyps    Diverticulosis    Hyperlipidemia    Hypertension    Kidney stones    Osteopenia    T-2.3   Right renal artery stenosis     Patient Active Problem List   Diagnosis Date Noted   Bilateral leg cramps 06/03/2024   Vitamin D  deficiency 02/25/2023   Right renal artery stenosis    Osteopenia    Carotid artery stenosis 01/03/2014   Hypertension    Hyperlipidemia     Past Surgical History:  Procedure Laterality Date   ABDOMINAL HYSTERECTOMY  12/22/1980   Partial hysterectomy   CARDIAC CATHETERIZATION     COLONOSCOPY N/A 07/30/2016   Procedure: COLONOSCOPY;  Surgeon: Lamar CHRISTELLA Hollingshead, MD;  Location: [facility];  Service: Endoscopy;  Laterality: N/A;  8:30 AM   FLEXIBLE SIGMOIDOSCOPY  11/28/2021   Procedure: FLEXIBLE SIGMOIDOSCOPY;  Surgeon: Cindie Carlin POUR, DO;  Location: [facility];  Service: Endoscopy;;   LEFT HEART CATH AND CORONARY ANGIOGRAPHY N/A 01/09/2020   Procedure: LEFT HEART CATH AND CORONARY ANGIOGRAPHY;  Surgeon: Mady Bruckner, MD;  Location: [facility];  Service: Cardiovascular;  Laterality: N/A;   POLYPECTOMY  07/30/2016    Procedure: POLYPECTOMY;  Surgeon: Lamar CHRISTELLA Hollingshead, MD;  Location: [facility];  Service: Endoscopy;;  descending colon   SMALL INTESTINE SURGERY      OB History   No obstetric history on file.      Home Medications    Prior to Admission medications   Medication Sig Start Date End Date Taking? Authorizing Provider  brompheniramine-pseudoephedrine -DM 30-2-10 MG/5ML syrup Take 5 mLs by mouth at bedtime as needed. 10/19/24  Yes Leath-Warren, Etta PARAS, NP  amLODipine  (NORVASC ) 10 MG tablet Take 1 tablet (10 mg total) by mouth daily. 06/03/24   Melvenia Manus BRAVO, MD  aspirin  EC 81 MG tablet Take 81 mg by mouth daily.    [provider]  cetirizine  (ZYRTEC ) 5 MG tablet Take 1 tablet (5 mg total) by mouth daily. 07/05/24   Enedelia Dorna CHRISTELLA, FNP  cholecalciferol  (VITAMIN D ) 1000 UNITS tablet Take 1,000 Units by mouth daily.    [provider]  Evolocumab (REPATHA SURECLICK) 140 MG/ML SOAJ Inject 140 mg into the skin every 14 (fourteen) days. 10/19/24   Hilty, Vinie [name], MD  fluticasone  (FLONASE ) 50 MCG/ACT nasal spray Place 2 sprays into both nostrils daily. 10/16/23   Melvenia Manus BRAVO, MD  hydrochlorothiazide  (HYDRODIURIL ) 25 MG tablet TAKE 1 TABLET(25 MG) BY MOUTH DAILY 07/18/24   Bevely Doffing, FNP  losartan  (COZAAR ) 100 MG tablet TAKE 1 TABLET(100 MG) BY MOUTH DAILY 07/18/24  Bevely Doffing, FNP  rosuvastatin  (CRESTOR ) 20 MG tablet Take 1 tablet (20 mg total) by mouth daily. 07/11/24   Hilty, Vinie [name], MD    Family History Family History  Problem Relation Age of Onset   Cancer Mother        pancratic   Heart attack Mother    Cancer Father        stomach   Diabetes Father    Hyperlipidemia Father    Heart disease Father        NOT  before age 53   Cancer Sister        lung   Hyperlipidemia Sister    Cancer Brother        thyroid , prostate   Hyperlipidemia Brother    Heart attack Brother    Cancer Sister        colon   Hyperlipidemia Sister    Cancer  Sister        colon   Hyperlipidemia Sister     Social History Social History   Tobacco Use   Smoking status: Former    Current packs/day: 0.00    Average packs/day: 0.5 packs/day for 23.0 years (11.5 ttl pk-yrs)    Types: Cigarettes    Start date: 12/22/1973    Quit date: 12/22/1996    Years since quitting: 27.8   Smokeless tobacco: Never  Vaping Use   Vaping status: Never Used  Substance Use Topics   Alcohol  use: No   Drug use: No     Allergies   Tetanus toxoid-containing vaccines   Review of Systems Review of Systems Per HPI  Physical Exam Triage Vital Signs ED Triage Vitals  Encounter Vitals Group     BP 10/19/24 1445 116/71     Girls Systolic BP Percentile --      Girls Diastolic BP Percentile --      Boys Systolic BP Percentile --      Boys Diastolic BP Percentile --      Pulse Rate 10/19/24 1445 71     Resp 10/19/24 1445 20     Temp 10/19/24 1445 98.4 F (36.9 C)     Temp Source 10/19/24 1445 Oral     SpO2 10/19/24 1445 96 %     Weight --      Height --      Head Circumference --      Peak Flow --      Pain Score 10/19/24 1443 7     Pain Loc --      Pain Education --      Exclude from Growth Chart --    No data found.  Updated Vital Signs BP 116/71 (BP Location: Right Arm)   Pulse 71   Temp 98.4 F (36.9 C) (Oral)   Resp 20   SpO2 96%   Visual Acuity Right Eye Distance:   Left Eye Distance:   Bilateral Distance:    Right Eye Near:   Left Eye Near:    Bilateral Near:     Physical Exam Vitals and nursing note reviewed.  Constitutional:      General: She is not in acute distress.    Appearance: Normal appearance.  HENT:     Head: Normocephalic.     Right Ear: Tympanic membrane, ear canal and external ear normal.     Left Ear: Tympanic membrane, ear canal and external ear normal.     Nose: Nose normal.     Right Turbinates: Enlarged and swollen.  Left Turbinates: Enlarged and swollen.     Right Sinus: No maxillary sinus  tenderness or frontal sinus tenderness.     Left Sinus: No maxillary sinus tenderness or frontal sinus tenderness.     Mouth/Throat:     Lips: Pink.     Mouth: Mucous membranes are moist.     Pharynx: Postnasal drip present. No pharyngeal swelling, oropharyngeal exudate, posterior oropharyngeal erythema or uvula swelling.     Comments: Cobblestoning present to posterior oropharynx  Eyes:     Extraocular Movements: Extraocular movements intact.     Conjunctiva/sclera: Conjunctivae normal.     Pupils: Pupils are equal, round, and reactive to light.  Cardiovascular:     Rate and Rhythm: Normal rate and regular rhythm.     Pulses: Normal pulses.  Pulmonary:     Effort: Pulmonary effort is normal. No respiratory distress.     Breath sounds: Normal breath sounds. No stridor. No wheezing, rhonchi or rales.  Abdominal:     General: Bowel sounds are normal.     Palpations: Abdomen is soft.     Tenderness: There is no abdominal tenderness.  Musculoskeletal:     Cervical back: Normal range of motion.  Skin:    General: Skin is warm and dry.  Neurological:     General: No focal deficit present.     Mental Status: She is alert and oriented to person, place, and time.  Psychiatric:        Mood and Affect: Mood normal.        Behavior: Behavior normal.      UC Treatments / Results  Labs (all labs ordered are listed, but only abnormal results are displayed) Labs Reviewed - No data to display  EKG   Radiology No results found.  Procedures Procedures (including critical care time)  Medications Ordered in UC Medications - No data to display  Initial Impression / Assessment and Plan / UC Course  I have reviewed the triage vital signs and the nursing notes.  Pertinent labs & imaging results that were available during my care of the patient were reviewed by me and considered in my medical decision making (see chart for details).  Patient presents with a 2-day history of upper  respiratory symptoms.  On initial exam, the patient's lung sounds are clear throughout, room air sats are 96%.  The patient is well-appearing, she is in no acute distress, vital signs are stable.  Symptoms consistent with a viral URI with cough. Promethazine  DM prescribed for cough. Discussed side effects of medication with the patient.  Supportive care recommendations were provided and discussed with the patient to include fluids, rest, over-the-counter analgesics, and use of a humidifier during sleep.  Discussed indications with patient regarding follow-up.  Patient was in agreement with this plan of care and verbalizes understanding.  All questions were answered.  Patient stable for discharge.  Final Clinical Impressions(s) / UC Diagnoses   Final diagnoses:  Viral URI with cough     Discharge Instructions      Take medication as prescribed.  As discussed, the medication may cause drowsiness.  Make sure you are taking it only at bedtime. Continue Flonase  and Zyrtec  daily. Recommend over-the-counter Coricidin or Robitussin during the daytime to help with your cough. You may take over-the-counter Tylenol  as needed for pain, fever, or general discomfort. Recommend normal saline nasal spray for nasal congestion and runny nose. For your cough, recommend use of a humidifier in your bedroom at nighttime during sleep  and sleeping elevated on pillows while cough symptoms persist. Your symptoms should begin to improve over the next 5 to 7 days.  If symptoms fail to improve, or begin to worsen, you may follow-up in this clinic or with your primary care physician for further evaluation. Follow-up as needed.     ED Prescriptions     Medication Sig Dispense Auth. Provider   brompheniramine-pseudoephedrine -DM 30-2-10 MG/5ML syrup Take 5 mLs by mouth at bedtime as needed. 100 mL Leath-Warren, Etta PARAS, NP      PDMP not reviewed this encounter.   Gilmer Etta PARAS, NP 10/19/24 1531

## 2024-10-28 ENCOUNTER — Ambulatory Visit (HOSPITAL_COMMUNITY)
Admission: RE | Admit: 2024-10-28 | Discharge: 2024-10-28 | Disposition: A | Discharge status: Home / Self Care | Source: Ambulatory Visit

## 2024-10-28 DIAGNOSIS — Z780 Asymptomatic menopausal state: Secondary | ICD-10-CM | POA: Insufficient documentation

## 2024-12-02 ENCOUNTER — Ambulatory Visit

## 2024-12-02 VITALS — BP 133/73 | HR 69 | Ht 63.0 in | Wt 152.0 lb

## 2024-12-02 DIAGNOSIS — E785 Hyperlipidemia, unspecified: Secondary | ICD-10-CM

## 2024-12-02 DIAGNOSIS — E559 Vitamin D deficiency, unspecified: Secondary | ICD-10-CM

## 2024-12-02 DIAGNOSIS — R7303 Prediabetes: Secondary | ICD-10-CM | POA: Diagnosis not present

## 2024-12-02 DIAGNOSIS — R252 Cramp and spasm: Secondary | ICD-10-CM

## 2024-12-02 DIAGNOSIS — I10 Essential (primary) hypertension: Secondary | ICD-10-CM | POA: Diagnosis not present

## 2024-12-02 NOTE — Progress Notes (Unsigned)
 Established Patient Office Visit  Subjective   Patient ID: Stacey Moody, female    DOB: 1946-03-17  Age: 78 y.o. MRN: [id number]  Chief Complaint  Patient presents with   Medical Management of Chronic Issues    Pt here for a follow up     HPI  Patient Active Problem List   Diagnosis Date Noted   Prediabetes 12/04/2024   Bilateral leg cramps 06/03/2024   Vitamin D  deficiency 02/25/2023   Right renal artery stenosis    Osteopenia    Carotid artery stenosis 01/03/2014   Hypertension    Hyperlipidemia     ROS    Objective:     BP 133/73   Pulse 69   Ht 5' 3 (1.6 m)   Wt 152 lb 0.6 oz (69 kg)   SpO2 95%   BMI 26.93 kg/m  BP Readings from Last 3 Encounters:  12/02/24 133/73  10/19/24 116/71  09/01/24 (!) 156/80   Wt Readings from Last 3 Encounters:  12/02/24 152 lb 0.6 oz (69 kg)  10/11/24 155 lb (70.3 kg)  06/15/24 157 lb 9.6 oz (71.5 kg)     Physical Exam Vitals and nursing note reviewed.  Constitutional:      Appearance: Normal appearance.  HENT:     Head: Normocephalic.     Right Ear: Tympanic membrane, ear canal and external ear normal.     Left Ear: Tympanic membrane, ear canal and external ear normal.     Nose: Nose normal.     Mouth/Throat:     Mouth: Mucous membranes are moist.     Pharynx: Oropharynx is clear.  Eyes:     Extraocular Movements: Extraocular movements intact.     Pupils: Pupils are equal, round, and reactive to light.  Cardiovascular:     Rate and Rhythm: Normal rate and regular rhythm.  Pulmonary:     Effort: Pulmonary effort is normal.     Breath sounds: Normal breath sounds.  Musculoskeletal:     Cervical back: Normal range of motion and neck supple.  Skin:    General: Skin is warm and dry.  Neurological:     Mental Status: She is alert and oriented to person, place, and time.  Psychiatric:        Mood and Affect: Mood normal.        Thought Content: Thought content normal.      Last CBC Lab Results  Component  Value Date   WBC 6.1 12/02/2024   HGB 12.5 12/02/2024   HCT 37.5 12/02/2024   MCV 95 12/02/2024   MCH 31.8 12/02/2024   RDW 13.0 12/02/2024   PLT 257 12/02/2024   Last metabolic panel Lab Results  Component Value Date   GLUCOSE 124 (H) 12/02/2024   NA 141 12/02/2024   K 4.0 12/02/2024   CL 103 12/02/2024   CO2 24 12/02/2024   BUN 14 12/02/2024   CREATININE 0.80 12/02/2024   EGFR 75 12/02/2024   CALCIUM  9.2 12/02/2024   PROT 6.4 12/02/2024   ALBUMIN 4.3 12/02/2024   LABGLOB 2.1 12/02/2024   AGRATIO 1.7 05/29/2023   BILITOT 0.4 12/02/2024   ALKPHOS 93 12/02/2024   AST 19 12/02/2024   ALT 13 12/02/2024   ANIONGAP 11 02/04/2023   Last lipids Lab Results  Component Value Date   CHOL 261 (H) 05/29/2023   HDL 62 05/29/2023   LDLCALC 171 (H) 05/29/2023   TRIG 157 (H) 05/29/2023   CHOLHDL 4.2 05/29/2023  Last hemoglobin A1c Lab Results  Component Value Date   HGBA1C 6.6 (H) 12/02/2024   Last thyroid  functions Lab Results  Component Value Date   TSH 2.310 12/02/2024   FREET4 1.12 12/02/2024   Last vitamin D  Lab Results  Component Value Date   VD25OH 55.5 12/02/2024   Last vitamin B12 and Folate Lab Results  Component Value Date   VITAMINB12 400 12/02/2024   FOLATE 13.2 12/02/2024      The 10-year ASCVD risk score (Arnett DK, et al., 2019) is: 33.3%    Assessment & Plan:   Problem List Items Addressed This Visit       Cardiovascular and Mediastinum   Hypertension - Primary   BP is stable today.  Her current antihypertensive regimen includes losartan  100 mg daily, amlodipine  5 mg daily, and HCTZ 25 mg daily. No medication changes made at this time.        Relevant Orders   CBC with Differential/Platelet (Completed)   CMP14+EGFR (Completed)   B12 and Folate Panel (Completed)     Other   Hyperlipidemia   She is currently prescribed Repatha .        Relevant Orders   CBC with Differential/Platelet (Completed)   CMP14+EGFR (Completed)    Hemoglobin A1c (Completed)   TSH + free T4 (Completed)   Vitamin D  deficiency   Currently on 1000 units daily.  Check Vitamin D        Relevant Orders   VITAMIN D  25 Hydroxy (Vit-D Deficiency, Fractures) (Completed)   B12 and Folate Panel (Completed)   Bilateral leg cramps   Her additional concern today is intermittent leg cramps occurring at night over the last 2 months.  Will update labs today to screen for electrolyte abnormalities.  Recommend starting an over-the-counter magnesium supplement for symptom relief.      Relevant Orders   B12 and Folate Panel (Completed)   Magnesium (Completed)   Prediabetes   Check fasting labs today.  F/U according to lab results.       Relevant Orders   CMP14+EGFR (Completed)   Hemoglobin A1c (Completed)    Return in about 6 months (around 06/02/2025) for chronic follow-up with PCP.    Leita Longs, FNP

## 2024-12-03 LAB — CMP14+EGFR
ALT: 13 IU/L (ref 0–32)
AST: 19 IU/L (ref 0–40)
Albumin: 4.3 g/dL (ref 3.8–4.8)
Alkaline Phosphatase: 93 IU/L (ref 49–135)
BUN/Creatinine Ratio: 18 (ref 12–28)
BUN: 14 mg/dL (ref 8–27)
Bilirubin Total: 0.4 mg/dL (ref 0.0–1.2)
CO2: 24 mmol/L (ref 20–29)
Calcium: 9.2 mg/dL (ref 8.7–10.3)
Chloride: 103 mmol/L (ref 96–106)
Creatinine, Ser: 0.8 mg/dL (ref 0.57–1.00)
Globulin, Total: 2.1 g/dL (ref 1.5–4.5)
Glucose: 124 mg/dL — ABNORMAL HIGH (ref 70–99)
Potassium: 4 mmol/L (ref 3.5–5.2)
Sodium: 141 mmol/L (ref 134–144)
Total Protein: 6.4 g/dL (ref 6.0–8.5)
eGFR: 75 mL/min/1.73 (ref >59)

## 2024-12-03 LAB — CBC WITH DIFFERENTIAL/PLATELET
Basophils Absolute: 0.1 x10E3/uL (ref 0.0–0.2)
Basos: 1 %
EOS (ABSOLUTE): 0.1 x10E3/uL (ref 0.0–0.4)
Eos: 2 %
Hematocrit: 37.5 % (ref 34.0–46.6)
Hemoglobin: 12.5 g/dL (ref 11.1–15.9)
Immature Grans (Abs): 0 x10E3/uL (ref 0.0–0.1)
Immature Granulocytes: 0 %
Lymphocytes Absolute: 1.4 x10E3/uL (ref 0.7–3.1)
Lymphs: 23 %
MCH: 31.8 pg (ref 26.6–33.0)
MCHC: 33.3 g/dL (ref 31.5–35.7)
MCV: 95 fL (ref 79–97)
Monocytes Absolute: 0.6 x10E3/uL (ref 0.1–0.9)
Monocytes: 9 %
Neutrophils Absolute: 4 x10E3/uL (ref 1.4–7.0)
Neutrophils: 65 %
Platelets: 257 x10E3/uL (ref 150–450)
RBC: 3.93 x10E6/uL (ref 3.77–5.28)
RDW: 13 % (ref 11.7–15.4)
WBC: 6.1 x10E3/uL (ref 3.4–10.8)

## 2024-12-03 LAB — HEMOGLOBIN A1C
Est. average glucose Bld gHb Est-mCnc: 143 mg/dL
Hgb A1c MFr Bld: 6.6 % — ABNORMAL HIGH (ref 4.8–5.6)

## 2024-12-03 LAB — TSH+FREE T4
Free T4: 1.12 ng/dL (ref 0.82–1.77)
TSH: 2.31 u[IU]/mL (ref 0.450–4.500)

## 2024-12-03 LAB — B12 AND FOLATE PANEL
Folate: 13.2 ng/mL (ref >3.0)
Vitamin B-12: 400 pg/mL (ref 232–1245)

## 2024-12-03 LAB — MAGNESIUM: Magnesium: 1.7 mg/dL (ref 1.6–2.3)

## 2024-12-03 LAB — VITAMIN D 25 HYDROXY (VIT D DEFICIENCY, FRACTURES): Vit D, 25-Hydroxy: 55.5 ng/mL (ref 30.0–100.0)

## 2024-12-04 ENCOUNTER — Ambulatory Visit: Payer: Self-pay

## 2024-12-04 ENCOUNTER — Other Ambulatory Visit: Payer: Self-pay

## 2024-12-04 DIAGNOSIS — R7303 Prediabetes: Secondary | ICD-10-CM | POA: Insufficient documentation

## 2024-12-04 DIAGNOSIS — E119 Type 2 diabetes mellitus without complications: Secondary | ICD-10-CM

## 2024-12-04 MED ORDER — METFORMIN HCL ER 500 MG PO TB24: 500.0000 mg | ORAL_TABLET | Freq: Every day | ORAL | 3 refills | Status: AC

## 2024-12-04 NOTE — Assessment & Plan Note (Signed)
 Her additional concern today is intermittent leg cramps occurring at night over the last 2 months.  Will update labs today to screen for electrolyte abnormalities.  Recommend starting an over-the-counter magnesium supplement for symptom relief.

## 2024-12-04 NOTE — Assessment & Plan Note (Signed)
 Check fasting labs today.  F/U according to lab results.

## 2024-12-04 NOTE — Assessment & Plan Note (Signed)
 She is currently prescribed Repatha .

## 2024-12-04 NOTE — Assessment & Plan Note (Signed)
Currently on 1000 units daily.  Check Vitamin D

## 2024-12-04 NOTE — Assessment & Plan Note (Signed)
 BP is stable today.  Her current antihypertensive regimen includes losartan  100 mg daily, amlodipine  5 mg daily, and HCTZ 25 mg daily. No medication changes made at this time.

## 2025-01-27 ENCOUNTER — Ambulatory Visit: Admitting: Student

## 2025-01-27 ENCOUNTER — Encounter: Payer: Self-pay | Admitting: Student

## 2025-01-27 VITALS — BP 122/60 | HR 68 | Ht 63.0 in | Wt 153.4 lb

## 2025-01-27 DIAGNOSIS — I2510 Atherosclerotic heart disease of native coronary artery without angina pectoris: Secondary | ICD-10-CM

## 2025-01-27 DIAGNOSIS — I6523 Occlusion and stenosis of bilateral carotid arteries: Secondary | ICD-10-CM

## 2025-01-27 DIAGNOSIS — E785 Hyperlipidemia, unspecified: Secondary | ICD-10-CM

## 2025-01-27 DIAGNOSIS — I10 Essential (primary) hypertension: Secondary | ICD-10-CM

## 2025-01-27 NOTE — Patient Instructions (Signed)
 Medication Instructions:   Continue current medication regimen.   *If you need a refill on your cardiac medications before your next appointment, please call your pharmacy*  Lab Work:  Lipid Panel at [facility].   If you have labs (blood work) drawn today and your tests are completely normal, you will receive your results only by: MyChart Message (if you have MyChart) OR A paper copy in the mail If you have any lab test that is abnormal or we need to change your treatment, we will call you to review the results.  Follow-Up: At [facility], you and your health needs are our priority.  As part of our continuing mission to provide you with exceptional heart care, our providers are all part of one team.  This team includes your primary Cardiologist (physician) and Advanced Practice Providers or APPs (Physician Assistants and Nurse Practitioners) who all work together to provide you with the care you need, when you need it.  Your next appointment:   1 year(s)  Provider:   You may see Maude Emmer, MD or one of the following Advanced Practice Providers on your designated Care Team:   Laymon Qua, PA-C  [place], [place] Olivia [place]avy, [place]     We recommend signing up for the patient portal called MyChart.  Sign up information is provided on this After Visit Summary.  MyChart is used to connect with patients for Virtual Visits (Telemedicine).  Patients are able to view lab/test results, encounter notes, upcoming appointments, etc.  Non-urgent messages can be sent to your provider as well.   To learn more about what you can do with MyChart, go to [email or web address].

## 2025-01-27 NOTE — Progress Notes (Signed)
 "  Cardiology Office Note    Date:  01/27/2025  ID:  Stacey Moody, Stacey Moody 27-Sep-1946, MRN [id number] Cardiologist: Maude Emmer, MD { : History of Present Illness:    Stacey Moody is a 79 y.o. female with past medical history of CAD (s/p cath in 12/2019 showing mild, nonobstructive disease), carotid artery stenosis, HTN and HLD who presents to the office today for annual follow-up.   She was examined by Dr. Emmer in 09/2023 and denied any recent anginal symptoms at that time. She was started on Nexletol  as she was not interested in injectable options for her hyperlipidemia and was referred to the [facility]. She was continued on Amlodipine  5 mg daily, ASA 81 mg daily, HCTZ 25 mg daily and Losartan  100 mg daily. She did follow-up with Dr. Mona in 05/2024 and reported having cramping and pain along her legs and was not sure if this was due to Nexletol  or a different etiology. It was advised that she take a holiday from the medication to see if symptoms improved and if they did improve, would recommend consideration of Repatha . She was switched to Repatha  in 09/2024.  In talking with the patient today, she reports overall feeling well since her last office visit. 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 She remains active at baseline and denies any recent chest pain, dyspnea on exertion, palpitations, orthopnea, PND or pitting edema. Blood pressure has been well-controlled when checked at home. She is overall tolerating Repatha  well but has noticed that her glucose has trended higher since being on this.  Studies Reviewed:   EKG: EKG is ordered today and demonstrates:   EKG Interpretation Date/Time:  Friday January 27 2025 13:55:53 EST Ventricular Rate:  68 PR Interval:  154 QRS Duration:  76 QT Interval:  398 QTC Calculation: 423 R Axis:   68  Text Interpretation: Normal sinus rhythm Normal ECG When compared with ECG of 12-Oct-2023  09:24, No significant change was found Confirmed by Johnson Grate ([id number]) on 01/27/2025 2:06:51 PM       Cardiac Catheterization: 12/2019 Conclusions: Mild, non-obstructive coronary artery disease, including 10-20% distal LMCA and 30% proximal RCA stenoses. Low left ventricular filling pressure.   Recommendations: Medical therapy and risk factor modification to prevent progression of coronary artery disease. Post-cath hydration.  Carotid Dopplers: 12/2022 Summary:  Right Carotid: Velocities in the right ICA are consistent with a 1-39%  stenosis.                The ECA appears >50% stenosed.   Left Carotid: Velocities in the left ICA are consistent with a 1-39%  stenosis.               The ECA appears >50% stenosed.   Vertebrals:  Bilateral vertebral arteries demonstrate antegrade flow.  Subclavians: Normal flow hemodynamics were seen in bilateral subclavian               arteries.    Physical Exam:   VS:  BP 122/60   Pulse 68   Ht 5' 3 (1.6 m)   Wt 153 lb 6.4 oz (69.6 kg)   SpO2 99%   BMI 27.17 kg/m    Wt Readings from Last 3 Encounters:  01/27/25 153 lb 6.4 oz (69.6 kg)  12/02/24 152 lb 0.6 oz (69 kg)  10/11/24 155 lb (70.3 kg)     GEN: Pleasant female appearing in no acute distress NECK:  No JVD; No carotid bruits CARDIAC: RRR, no murmurs, rubs, gallops RESPIRATORY:  Clear to auscultation without rales, wheezing or rhonchi  ABDOMEN: Appears non-distended. No obvious abdominal masses. EXTREMITIES: No clubbing or cyanosis. No pitting edema.  Distal pedal pulses are 2+ bilaterally.   Assessment and Plan:   1. Coronary artery disease involving native coronary artery of native heart without angina pectoris - Prior cardiac catheterization in 2021 showed mild, nonobstructive disease as outlined above with medical management recommended.  - She remains active at baseline and denies any recent anginal symptoms. Continue ASA 81 mg daily, Losartan  100 mg daily and  Repatha .  2. Bilateral carotid artery stenosis - Followed by Vascular Surgery. Her last examination was in 12/2022 and given that her stenosis had been stable at < 39% for many years, was recommended to repeat in 2 years. Would order follow-up carotid dopplers at the time of her next office visit if not obtained by Vascular in the interim. Continue ASA and Repatha .  3. Essential hypertension - BP is well-controlled at 122/60 during today's visit. Continue current medical therapy with Amlodipine  10 mg daily, HCTZ 25 mg daily and Losartan  100 mg daily.  4. Hyperlipidemia LDL goal <55 - NMR in 09/2024 showed her LDL was elevated at 155 and total cholesterol at 248 with small LDL particle number at 772. She has been on Repatha  given prior statin intolerances. Will recheck an NMR Lipoprofile as previously recommended by the Lipid Clinic.   Signed, Stacey CHRISTELLA Qua, PA-C   "

## 2025-06-02 ENCOUNTER — Ambulatory Visit: Payer: Self-pay

## 2025-10-16 ENCOUNTER — Ambulatory Visit
